# Patient Record
Sex: Male | Born: 1935 | Race: White | Hispanic: No | Smoking: Former smoker
Health system: Southern US, Community
[De-identification: ages and names within clinical notes are randomized; demographics above are authoritative.]

## PROBLEM LIST (undated history)

## (undated) DIAGNOSIS — E119 Type 2 diabetes mellitus without complications: Secondary | ICD-10-CM

## (undated) DIAGNOSIS — J449 Chronic obstructive pulmonary disease, unspecified: Secondary | ICD-10-CM

## (undated) DIAGNOSIS — I480 Paroxysmal atrial fibrillation: Secondary | ICD-10-CM

## (undated) DIAGNOSIS — F419 Anxiety disorder, unspecified: Secondary | ICD-10-CM

## (undated) DIAGNOSIS — E785 Hyperlipidemia, unspecified: Secondary | ICD-10-CM

## (undated) DIAGNOSIS — I499 Cardiac arrhythmia, unspecified: Secondary | ICD-10-CM

## (undated) DIAGNOSIS — G250 Essential tremor: Secondary | ICD-10-CM

## (undated) DIAGNOSIS — N189 Chronic kidney disease, unspecified: Secondary | ICD-10-CM

## (undated) DIAGNOSIS — L409 Psoriasis, unspecified: Secondary | ICD-10-CM

## (undated) DIAGNOSIS — I10 Essential (primary) hypertension: Secondary | ICD-10-CM

## (undated) DIAGNOSIS — I429 Cardiomyopathy, unspecified: Secondary | ICD-10-CM

## (undated) DIAGNOSIS — M48062 Spinal stenosis, lumbar region with neurogenic claudication: Secondary | ICD-10-CM

## (undated) DIAGNOSIS — M1990 Unspecified osteoarthritis, unspecified site: Secondary | ICD-10-CM

## (undated) DIAGNOSIS — N289 Disorder of kidney and ureter, unspecified: Secondary | ICD-10-CM

## (undated) HISTORY — PX: PACEMAKER INSERTION: SHX728

## (undated) HISTORY — PX: TONSILLECTOMY: SUR1361

## (undated) HISTORY — PX: INSERT / REPLACE / REMOVE PACEMAKER: SUR710

## (undated) HISTORY — PX: CYSTOSCOPY: SUR368

## (undated) HISTORY — PX: RETINAL DETACHMENT SURGERY: SHX105

## (undated) HISTORY — PX: ORIF ANKLE FRACTURE: SUR919

## (undated) HISTORY — PX: KIDNEY STONE SURGERY: SHX686

---

## 1993-03-26 HISTORY — PX: PENILE PROSTHESIS IMPLANT: SHX240

## 2002-03-26 HISTORY — PX: LASER OF PROSTATE W/ GREEN LIGHT PVP: SHX1953

## 2004-02-18 ENCOUNTER — Ambulatory Visit: Payer: Self-pay | Admitting: Urology

## 2004-11-09 ENCOUNTER — Other Ambulatory Visit: Payer: Self-pay

## 2004-11-09 ENCOUNTER — Ambulatory Visit: Payer: Self-pay | Admitting: Ophthalmology

## 2004-12-13 ENCOUNTER — Ambulatory Visit: Payer: Self-pay | Admitting: Ophthalmology

## 2005-11-30 ENCOUNTER — Ambulatory Visit: Payer: Self-pay | Admitting: Ophthalmology

## 2005-12-12 ENCOUNTER — Ambulatory Visit: Payer: Self-pay | Admitting: Ophthalmology

## 2007-11-11 ENCOUNTER — Inpatient Hospital Stay: Payer: Self-pay | Admitting: Cardiology

## 2007-11-12 ENCOUNTER — Other Ambulatory Visit: Payer: Self-pay

## 2008-01-13 ENCOUNTER — Ambulatory Visit: Payer: Self-pay | Admitting: Unknown Physician Specialty

## 2008-01-16 ENCOUNTER — Ambulatory Visit: Payer: Self-pay | Admitting: Unknown Physician Specialty

## 2010-05-04 ENCOUNTER — Ambulatory Visit: Payer: Self-pay | Admitting: Vascular Surgery

## 2010-07-11 ENCOUNTER — Ambulatory Visit: Payer: Self-pay | Admitting: Internal Medicine

## 2010-09-15 ENCOUNTER — Inpatient Hospital Stay: Payer: Self-pay | Admitting: Specialist

## 2010-10-11 ENCOUNTER — Ambulatory Visit: Payer: Self-pay | Admitting: Internal Medicine

## 2012-11-06 ENCOUNTER — Inpatient Hospital Stay: Payer: Self-pay | Admitting: Internal Medicine

## 2012-11-06 LAB — CBC WITH DIFFERENTIAL/PLATELET
Basophil %: 0.2 %
Lymphocyte %: 3.5 %
MCH: 31.5 pg (ref 26.0–34.0)
Monocyte #: 1.1 x10 3/mm — ABNORMAL HIGH (ref 0.2–1.0)
Neutrophil #: 14.7 10*3/uL — ABNORMAL HIGH (ref 1.4–6.5)
Neutrophil %: 89.5 %
Platelet: 143 10*3/uL — ABNORMAL LOW (ref 150–440)
RBC: 3.96 10*6/uL — ABNORMAL LOW (ref 4.40–5.90)
RDW: 13.2 % (ref 11.5–14.5)

## 2012-11-06 LAB — COMPREHENSIVE METABOLIC PANEL
Albumin: 2.3 g/dL — ABNORMAL LOW (ref 3.4–5.0)
Alkaline Phosphatase: 88 U/L (ref 50–136)
Anion Gap: 7 (ref 7–16)
Bilirubin,Total: 0.4 mg/dL (ref 0.2–1.0)
Chloride: 96 mmol/L — ABNORMAL LOW (ref 98–107)
Co2: 23 mmol/L (ref 21–32)
Creatinine: 3.28 mg/dL — ABNORMAL HIGH (ref 0.60–1.30)
EGFR (African American): 20 — ABNORMAL LOW
EGFR (Non-African Amer.): 17 — ABNORMAL LOW
Glucose: 204 mg/dL — ABNORMAL HIGH (ref 65–99)
Osmolality: 267 (ref 275–301)
Total Protein: 6.3 g/dL — ABNORMAL LOW (ref 6.4–8.2)

## 2012-11-06 LAB — TROPONIN I: Troponin-I: 0.1 ng/mL — ABNORMAL HIGH

## 2012-11-07 DIAGNOSIS — I4891 Unspecified atrial fibrillation: Secondary | ICD-10-CM

## 2012-11-07 DIAGNOSIS — R7989 Other specified abnormal findings of blood chemistry: Secondary | ICD-10-CM

## 2012-11-07 LAB — BASIC METABOLIC PANEL
Anion Gap: 6 — ABNORMAL LOW (ref 7–16)
BUN: 31 mg/dL — ABNORMAL HIGH (ref 7–18)
Calcium, Total: 8 mg/dL — ABNORMAL LOW (ref 8.5–10.1)
Chloride: 103 mmol/L (ref 98–107)
Co2: 24 mmol/L (ref 21–32)
Creatinine: 2.88 mg/dL — ABNORMAL HIGH (ref 0.60–1.30)
EGFR (African American): 23 — ABNORMAL LOW
Glucose: 150 mg/dL — ABNORMAL HIGH (ref 65–99)
Sodium: 133 mmol/L — ABNORMAL LOW (ref 136–145)

## 2012-11-07 LAB — CBC WITH DIFFERENTIAL/PLATELET
Basophil #: 0 10*3/uL (ref 0.0–0.1)
Basophil %: 0.3 %
Eosinophil #: 0.1 10*3/uL (ref 0.0–0.7)
Eosinophil %: 0.7 %
HGB: 11.8 g/dL — ABNORMAL LOW (ref 13.0–18.0)
Lymphocyte #: 0.8 10*3/uL — ABNORMAL LOW (ref 1.0–3.6)
Lymphocyte %: 6.2 %
MCHC: 35 g/dL (ref 32.0–36.0)
MCV: 90 fL (ref 80–100)
Monocyte #: 1.2 x10 3/mm — ABNORMAL HIGH (ref 0.2–1.0)
Monocyte %: 9 %
Neutrophil #: 10.9 10*3/uL — ABNORMAL HIGH (ref 1.4–6.5)
Platelet: 151 10*3/uL (ref 150–440)
RBC: 3.74 10*6/uL — ABNORMAL LOW (ref 4.40–5.90)
RDW: 13.1 % (ref 11.5–14.5)
WBC: 13 10*3/uL — ABNORMAL HIGH (ref 3.8–10.6)

## 2012-11-07 LAB — TROPONIN I: Troponin-I: 0.11 ng/mL — ABNORMAL HIGH

## 2012-11-07 LAB — CK TOTAL AND CKMB (NOT AT ARMC): CK-MB: 1.5 ng/mL (ref 0.5–3.6)

## 2012-11-07 LAB — PRO B NATRIURETIC PEPTIDE: B-Type Natriuretic Peptide: 7334 pg/mL — ABNORMAL HIGH (ref 0–450)

## 2012-11-08 LAB — CBC WITH DIFFERENTIAL/PLATELET
Comment - H1-Com1: NORMAL
Eosinophil: 3 %
HCT: 35.4 % — ABNORMAL LOW (ref 40.0–52.0)
HGB: 12.3 g/dL — ABNORMAL LOW (ref 13.0–18.0)
MCHC: 34.7 g/dL (ref 32.0–36.0)
Monocytes: 5 %
Myelocyte: 1 %
Platelet: 165 10*3/uL (ref 150–440)
RBC: 3.87 10*6/uL — ABNORMAL LOW (ref 4.40–5.90)
RDW: 13.9 % (ref 11.5–14.5)
WBC: 8.6 10*3/uL (ref 3.8–10.6)

## 2012-11-08 LAB — BASIC METABOLIC PANEL
Anion Gap: 4 — ABNORMAL LOW (ref 7–16)
BUN: 29 mg/dL — ABNORMAL HIGH (ref 7–18)
Calcium, Total: 8.9 mg/dL (ref 8.5–10.1)
Chloride: 109 mmol/L — ABNORMAL HIGH (ref 98–107)
Creatinine: 2.25 mg/dL — ABNORMAL HIGH (ref 0.60–1.30)
EGFR (African American): 31 — ABNORMAL LOW
EGFR (Non-African Amer.): 27 — ABNORMAL LOW
Glucose: 124 mg/dL — ABNORMAL HIGH (ref 65–99)
Osmolality: 285 (ref 275–301)
Potassium: 4.3 mmol/L (ref 3.5–5.1)

## 2012-11-09 LAB — BASIC METABOLIC PANEL
BUN: 23 mg/dL — ABNORMAL HIGH (ref 7–18)
Creatinine: 1.99 mg/dL — ABNORMAL HIGH (ref 0.60–1.30)
Glucose: 191 mg/dL — ABNORMAL HIGH (ref 65–99)
Osmolality: 283 (ref 275–301)
Sodium: 137 mmol/L (ref 136–145)

## 2012-11-10 LAB — BASIC METABOLIC PANEL
Calcium, Total: 9.7 mg/dL (ref 8.5–10.1)
Chloride: 106 mmol/L (ref 98–107)
Co2: 30 mmol/L (ref 21–32)
Creatinine: 1.92 mg/dL — ABNORMAL HIGH (ref 0.60–1.30)
EGFR (African American): 38 — ABNORMAL LOW
EGFR (Non-African Amer.): 33 — ABNORMAL LOW
Glucose: 149 mg/dL — ABNORMAL HIGH (ref 65–99)
Osmolality: 283 (ref 275–301)
Potassium: 4.5 mmol/L (ref 3.5–5.1)
Sodium: 139 mmol/L (ref 136–145)

## 2012-11-11 DIAGNOSIS — I4891 Unspecified atrial fibrillation: Secondary | ICD-10-CM

## 2012-11-11 LAB — BASIC METABOLIC PANEL
Calcium, Total: 9.4 mg/dL (ref 8.5–10.1)
Co2: 30 mmol/L (ref 21–32)
Creatinine: 1.89 mg/dL — ABNORMAL HIGH (ref 0.60–1.30)
EGFR (Non-African Amer.): 33 — ABNORMAL LOW
Glucose: 139 mg/dL — ABNORMAL HIGH (ref 65–99)
Potassium: 4.1 mmol/L (ref 3.5–5.1)
Sodium: 138 mmol/L (ref 136–145)

## 2012-11-11 LAB — CBC WITH DIFFERENTIAL/PLATELET
Basophil #: 0 10*3/uL (ref 0.0–0.1)
Basophil %: 0.5 %
Eosinophil #: 0.3 10*3/uL (ref 0.0–0.7)
HCT: 38.2 % — ABNORMAL LOW (ref 40.0–52.0)
HGB: 13.1 g/dL (ref 13.0–18.0)
Lymphocyte #: 1.2 10*3/uL (ref 1.0–3.6)
Lymphocyte %: 12.8 %
MCH: 31.4 pg (ref 26.0–34.0)
MCHC: 34.3 g/dL (ref 32.0–36.0)
Monocyte %: 9.1 %
Neutrophil #: 6.8 10*3/uL — ABNORMAL HIGH (ref 1.4–6.5)
Platelet: 212 10*3/uL (ref 150–440)
RBC: 4.17 10*6/uL — ABNORMAL LOW (ref 4.40–5.90)

## 2012-11-11 LAB — CULTURE, BLOOD (SINGLE)

## 2013-02-25 ENCOUNTER — Ambulatory Visit: Payer: Self-pay | Admitting: Specialist

## 2013-09-09 ENCOUNTER — Ambulatory Visit: Payer: Self-pay | Admitting: Nephrology

## 2014-05-05 DIAGNOSIS — Z950 Presence of cardiac pacemaker: Secondary | ICD-10-CM | POA: Diagnosis not present

## 2014-05-05 DIAGNOSIS — R0602 Shortness of breath: Secondary | ICD-10-CM | POA: Diagnosis not present

## 2014-05-05 DIAGNOSIS — I10 Essential (primary) hypertension: Secondary | ICD-10-CM | POA: Diagnosis not present

## 2014-05-05 DIAGNOSIS — I480 Paroxysmal atrial fibrillation: Secondary | ICD-10-CM | POA: Diagnosis not present

## 2014-05-05 DIAGNOSIS — E11329 Type 2 diabetes mellitus with mild nonproliferative diabetic retinopathy without macular edema: Secondary | ICD-10-CM | POA: Diagnosis not present

## 2014-05-05 DIAGNOSIS — H35371 Puckering of macula, right eye: Secondary | ICD-10-CM | POA: Diagnosis not present

## 2014-05-05 DIAGNOSIS — H43811 Vitreous degeneration, right eye: Secondary | ICD-10-CM | POA: Diagnosis not present

## 2014-07-16 NOTE — Consult Note (Signed)
Present Illness 81 year old who presented with 3 days of dysuria, fevers, chills, fatigue and some back pain,  mild nausea,  found to have severe UTI. Cardiology was consulted for elevated cardiac enz.  initially the decided by patient???s request to try outpatient treatment with Rocephin 1 gram IM in the office and  Cipro p.o. He did this through the first day though reported continued malaise and was admitted to the hospital. he had an elevated white count of 23,000, and renal function was worse, 17% GFR.   He reported increasing edema of the lower extremity, increasing bloating in the abdominal region,  some chest tightness.  He did have some fevers and chills last night the night before admission.   Denies any significant cardiac history. No recent chest pains with exertion prior to recent illness.    PAST MEDICAL HISTORY: 1.  Inflammatory arthritis, sero-negative.  2.  Gastritis and peptic ulcer disease.  3.  Hypercholesterolemia.  4.  Hypertension.  5.  Familial tremor.  6.  Osteopenia.  7.  Diabetes, type 2.  8.  Chronic kidney disease with a baseline creatinine of 1.3.  9.  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  10.  COPD. 11.  Osteoarthritis   12.  Anxiety. 13.  Nephrolithiasis.   PAST SURGICAL HISTORY: 1.  Nephrotomy for kidney stones.  2.  Penile implant.  3.  Dual chamber pacemaker in 2009.  4.  Tonsillectomy.  5.  Right ankle fracture with ORIF repair June 2012.   SOCIAL HISTORY:  Married.  Denies smoking but does chew tobacco. Denies alcohol.   FAMILY HISTORY: His brother died of heart disease in his 67s. Mother also had a heart problem.   Physical Exam:  GEN well developed, well nourished, no acute distress   HEENT hearing intact to voice, moist oral mucosa   NECK supple   RESP normal resp effort  clear BS  wheezing   CARD Regular rate and rhythm  No murmur   ABD denies tenderness  soft   LYMPH negative neck   EXTR negative  edema   SKIN normal to palpation   NEURO motor/sensory function intact   PSYCH alert, A+O to time, place, person, good insight   Review of Systems:  Subjective/Chief Complaint Feels ok, increased tremor, weak   General: Weakness   Skin: No Complaints   ENT: No Complaints   Eyes: No Complaints   Neck: No Complaints   Respiratory: No Complaints   Cardiovascular: No Complaints   Gastrointestinal: No Complaints   Genitourinary: No Complaints   Vascular: No Complaints   Musculoskeletal: No Complaints   Neurologic: No Complaints   Hematologic: No Complaints   Endocrine: No Complaints   Psychiatric: No Complaints   Review of Systems: All other systems were reviewed and found to be negative   Medications/Allergies Reviewed Medications/Allergies reviewed     pacemaker:    right ankle fracture: 15-Sep-2010       Admit Diagnosis:   RENAL FAILURE PYELONEPHRITIS.: Onset Date: 07-Nov-2012, Status: Active, Description: RENAL FAILURE PYELONEPHRITIS.  Home Medications: Medication Instructions Status  citalopram tablet 20 mg 1 tab(s) orally once a day  Active  glipiZIDE tablet, extended release 2.5 mg 1 tab(s) orally once a day  Active  omeprazole 20 mg oral delayed release tablet 1 tab(s) orally 2 times a day Active  simvastatin 40 mg oral tablet 1 tab(s) orally once a day (at bedtime) Active  primidone 50 mg oral tablet 1 tab(s) orally 2 times a  day Active  lorazepam 0.5 mg oral tablet tab(s) orally once a day, As Needed Active  metoprolol 50 tab(s) orally once a day Active  Aspir 81 oral delayed release tablet 1 tab(s) orally once a day Active  Advair Diskus 250 mcg-50 mcg inhalation powder 1 puff(s) inhaled 2 times a day Active  quinapril 10 mg oral tablet 1 tab(s) orally once a day Active  gabapentin 300 mg oral capsule 1 cap(s) orally 3 times a day Active   Lab Results:  Routine Chem:  15-Aug-14 00:35   Glucose, Serum  150  BUN  31  Creatinine (comp)   2.88  Sodium, Serum  133  Potassium, Serum 3.7  Chloride, Serum 103  CO2, Serum 24  Calcium (Total), Serum  8.0  Anion Gap  6  Osmolality (calc) 276  eGFR (African American)  23  eGFR (Non-African American)  20 (eGFR values <49m/min/1.73 m2 may be an indication of chronic kidney disease (CKD). Calculated eGFR is useful in patients with stable renal function. The eGFR calculation will not be reliable in acutely ill patients when serum creatinine is changing rapidly. It is not useful in  patients on dialysis. The eGFR calculation may not be applicable to patients at the low and high extremes of body sizes, pregnant women, and vegetarians.)  Result Comment TROPONIN - RESULTS VERIFIED BY REPEAT TESTING.  - PREVIOUS CALL:11/06/12 AT 1725..Marland KitchenMarland KitchenPL  Result(s) reported on 07 Nov 2012 at 01:49AM.  B-Type Natriuretic Peptide ([facility]  [phone](Result(s) reported on 07 Nov 2012 at 01:27AM.)  Cardiac:  15-Aug-14 00:35   CK, Total 56  CPK-MB, Serum 1.3 (Result(s) reported on 07 Nov 2012 at 01:42AM.)  Troponin I  0.11 (0.00-0.05 0.05 ng/mL or less: NEGATIVE  Repeat testing in 3-6 hrs  if clinically indicated. >0.05 ng/mL: POTENTIAL  MYOCARDIAL INJURY. Repeat  testing in 3-6 hrs if  clinically indicated. NOTE: An increase or decrease  of 30% or more on serial  testing suggests a  clinically important change)  Routine Hem:  15-Aug-14 00:35   WBC (CBC)  13.0  RBC (CBC)  3.74  Hemoglobin (CBC)  11.8  Hematocrit (CBC)  33.7  Platelet Count (CBC) 151  MCV 90  MCH 31.6  MCHC 35.0  RDW 13.1  Neutrophil % 83.8  Lymphocyte % 6.2  Monocyte % 9.0  Eosinophil % 0.7  Basophil % 0.3  Neutrophil #  10.9  Lymphocyte #  0.8  Monocyte #  1.2  Eosinophil # 0.1  Basophil # 0.0 (Result(s) reported on 07 Nov 2012 at 01:11AM.)   EKG:  Interpretation EKG shows atrial fibrillation with rate 103 bpm.Diffuse ST and T wave ABN   Radiology Results: [place]    14-Aug-14 18:13, [place]  [place]  Bilateral   REASON FOR EXAM:    ARF  COMMENTS:       PROCEDURE: [place] - [facility]  - Nov 06 2012  6:13PM     RESULT: The right kidney measures 13.3 x 6.8 x 6.6 cm. The left kidney   measures 10.7 x 6.1 x 4.7 cm. The echotexture of the renal cortex on the   right remains lower than that of the adjacent liver. There is no   hydronephrosis on the right or on the left. The cortex appears somewhat   thinned on the left and overall the kidney is smaller than the right. The   echotexture of the cortex on the left appears mildly increased.    The urinary bladder  is partially distended. Ureteral jets cord   demonstrated. There is likely a bladder latter diverticulum measuring   approximately 3 cm in diameter to the right of midline.  IMPRESSION:   1. There is mild atrophy of the left kidney with cortical thinning   increased cortical echotexture.  2. There is no evidence of obstruction of either kidney.  3. A bladder diverticulum on the right is suspected.     Dictation Site: 5        Verified By: DAVID A. Martinique, M.D., MD    Sulfa drugs: Rash  Lipitor: Other  Vital Signs/Nurse's Notes: **Vital Signs.:   15-Aug-14 11:49  Vital Signs Type Q 4hr  Temperature Temperature (F) 97.3  Celsius 36.2  Temperature Source axillary  Pulse Pulse 101  Respirations Respirations 19  Systolic BP Systolic BP 97  Diastolic BP (mmHg) Diastolic BP (mmHg) 60  Mean BP 72  Pulse Ox % Pulse Ox % 88  Pulse Ox Activity Level  At rest  Oxygen Delivery 1L    Impression 81 year old who presented with 3 days of dysuria, fevers, chills, fatigue and some back pain,  mild nausea,  found to have severe UTI. Cardiology was consulted for elevated cardiac enz.  1) Atrial fibrillation: In atrial fib on arrival, converted to NSR at 7 pm today Suspect he may have paroxysmal atrial fibrillation He is asymptomatic, with worsening SOB, edema for one week Edema does come and go per the wife. Prelim ECHO showing  grossly normal EF. --Will start eliquis 2.5 mg BID --Will need to decrease metoprolol dose in setting of hypotension, infection from UTI --If he has additional epsiodes of atrial fib, will start amiodarone BID dosing  2) Elevated cardiac enz: Minimal change, minimal elevation Demand ischemia from hypotension from infection, atrial fib wityh RVR Medical management at this time  3) UTI/sepsis On abx,  BP still running low Would hopld ACE, hold parameters on metoprolol placed  4) Pacer: sick sinus syndrome  5) Rneall insufficiency: baseline uncertain Mild improvement Possible ATN from infection   Electronic Signatures: Ida Rogue (MD)  (Signed 16-Aug-14 10:25)  Authored: General Aspect/Present Illness, History and Physical Exam, Review of System, Past Medical History, Health Issues, Home Medications, Labs, EKG , Radiology, Allergies, Vital Signs/Nurse's Notes, Impression/Plan   Last Updated: 16-Aug-14 10:25 by Ida Rogue (MD)

## 2014-07-16 NOTE — Discharge Summary (Signed)
PATIENT NAME:  Johnathan Hudson MR#:  [id number] DATE OF BIRTH:  1933/10/10  DATE OF ADMISSION:  11/06/2012 DATE OF DISCHARGE:  11/11/2012  DIAGNOSES AT THE TIME OF DISCHARGE:  1. Urinary tract infection with systemic inflammatory response syndrome, resolved with treatment.  2. Acute renal failure.  3. Type 2 diabetes.  4. Hypertension.  5. Tremors.  6. Elevated cardiac enzymes.  7. Atrial fibrillation.   CHIEF COMPLAINT: Abdominal bloating, chest tightness, fevers and chills.   HISTORY OF PRESENT ILLNESS: Johnathan Hudson is a 81 year old male who presented to the clinic complaining of dysuria associated with fever, chills, fatigue and back pain, associated with nausea. The patient was noted to have a urinary tract infection and subsequently admitted to the hospital. The patient was tried, the day prior to admission, with outpatient therapy including Rocephin and Cipro, but since his condition was still declining and his white cell count was elevated at 23,000 with significant worsening renal function, he was admitted for intravenous antibiotics and IV fluids.   PAST MEDICAL HISTORY: Significant for: 1. Inflammatory arthritis.  2. Gastritis.  3. Hyperlipidemia.  4. Hypertension.  5. Familial tremor.  6. Osteopenia.  7. Type 2 diabetes.  8. Chronic kidney disease, baseline creatinine 1.3.  9. 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  10. History of COPD. 11. Osteoarthritis.  12. Anxiety.  13. Nephrolithiasis.   PAST SURGICAL HISTORY: Significant for:  1. Nephrotomy for kidney stones.  2. Penile implant.  3. Dual chamber pacemaker.  4. Tonsillectomy.  5. Right ankle fracture with ORIF.   PHYSICAL EXAMINATION:  GENERAL: He was not in distress.  VITAL SIGNS: Weight was 187, blood pressure 110/70, temperature was 97, pulse was 87, O2 saturation 95% on room air.  HEENT: [place], AT.  NECK: Supple.  HEART: S1, S2.  ABDOMEN: Distended.  EXTREMITIES: 1+ edema.   NEUROLOGIC: Nonfocal.   HOSPITAL COURSE: The patient was noted to have an elevated white cell count of 23,000. Creatinine 3.6, BUN of 31, potassium 4.1, sodium 130, glucose 184. He had been given 1 gram of Rocephin in the office the day prior to admission and subsequently placed on Zosyn. He was also started on normal saline, and his glipizide was held, and he was switched to sliding scale coverage. During his stay in the hospital, the patient was also seen in consultation by cardiologist, Dr. Rockey Situ, because he developed atrial fibrillation and was started on amiodarone and low-dose Eliquis. His metoprolol was increased to 75 mg for rate and rhythm control. EKG subsequently showed normal sinus rhythm with ST-T changes consistent with ischemia. His troponin also was noted to be elevated at 0.11, total protein was 6.3, albumin was 2.3. The patient's renal function gradually improved. His creatinine came down to 1.89, and he was switched from Zosyn to Cipro. An echocardiogram showed LVEF of 60% to 65%, normal global left ventricular systolic function, with mild concentric LVH, mildly increased LV septal thickness, mildly increased left ventricular posterior wall thickness. The patient appeared to respond well to above measures, and his clinical condition improved significantly. He was back in sinus rhythm and was ambulated, seen by physical therapy and was stable at the time of discharge. An ultrasound of the kidney showed mild atrophy of the left kidney with cortical thickening, with increased cortical echo structure. There was no evidence of obstruction in either kidney. A bladder diverticulum on the right was suspected. The patient was discharged in stable condition on the following medications.   DISCHARGE MEDICATIONS:  1. Glipizide extended release 2.5 mg once a day.  2. Simvastatin 40 mg once a day.  3. Primidone 50 mg b.i.d. 4. Lorazepam 0.5 mg once a day as needed.  5. Aspirin 81 mg a day.  6.  Gabapentin 300 mg 3 times a day.  7. Quinapril 5 mg once a day.  8. Metoprolol succinate 75 mg once a day. 9. Apixaban 2.5 mg b.i.d. 10. Amiodarone 200 mg once a day.  11. Cipro 500 mg p.o. b.i.d. for 5 more days.   FOLLOWUP: The patient was advised to follow up with his cardiologist, Dr. Saralyn Pilar, and also follow with me, Dr. Ginette Pitman, in 1 to 2 weeks' time.   TOTAL TIME SPENT ON DISCHARGING THIS PATIENT: 35 minutes.   ____________________________ Tracie Harrier, MD vh:OSi D: 11/12/2012 13:07:18 ET T: 11/12/2012 14:17:17 ET JOB#: [id number]  cc: Tracie Harrier, MD, <Dictator> Tracie Harrier MD ELECTRONICALLY SIGNED 11/27/2012 17:39

## 2014-07-16 NOTE — H&P (Signed)
PATIENT NAME:  Johnathan Hudson, Johnathan Hudson MR#:  [id number] DATE OF BIRTH:  [birth year]  DATE OF ADMISSION:  11/06/2012  CHIEF COMPLAINT: Acute renal failure.   HISTORY OF PRESENT ILLNESS: A 81 year old who presented to the clinic yesterday with 3 days of dysuria, fevers, chills, fatigue and some back pain. He did have some mild nausea yesterday. He was found to have severe UTI and hospital admission was discussed. It was decided by patient's request to try outpatient treatment with Rocephin 1 gram IM in the office and starting on Cipro p.o. He did this through the day yesterday and in further contact today had shown signs of declining. His lab results came back with an elevated white count of 23,000, and renal function had declined significantly to approximately 17% GFR. He has noticed increasing edema, lower extremity. He has also noticed some increasing bloating in the abdominal region as well as some chest tightness. His nausea has improved. He did have some fevers and chills last night. He is afebrile here in the office. He does have a history of UTIs and has been followed by Dr. Yves Dill in the past.    PAST MEDICAL HISTORY: 1.  Inflammatory arthritis, sero-negative.  2.  Gastritis and peptic ulcer disease.  3.  Hypercholesterolemia.  4.  Hypertension.  5.  Familial tremor.  6.  Osteopenia.  7.  Diabetes, type 2.  8.  Chronic kidney disease with a baseline creatinine of 1.3.  9.  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  10.  COPD. 11.  Osteoarthritis   12.  Anxiety. 13.  Nephrolithiasis.   PAST SURGICAL HISTORY: 1.  Nephrotomy for kidney stones.  2.  Penile implant.  3.  Dual chamber pacemaker in 2009.  4.  Tonsillectomy.  5.  Right ankle fracture with ORIF repair June 2012.   SOCIAL HISTORY:  Married.  Denies smoking but does chew tobacco. Denies alcohol.  FAMILY HISTORY: His brother died of heart disease in his 87s. Mother also had a heart problem.   REVIEW OF SYSTEMS: He  reports some headache as well as some neck pain. No sore throat or ear pain. Minimal congestion. He has had a mild cough. He did report some chest tightness. He has had abdominal bloating. He has been drinking lots of water. Bowels have not moved today. He has had dysuria. No prior history of obstruction, per patient, though he has had kidney stones. He is having new lower extremity edema. He has chronic back pain. He has been on primidone for a tremor, which he does not feel has helped, more recently started with gabapentin approximately a week or 2 ago which he does see some benefit.  No current nausea or vomiting. He was able to eat some food today.   ALLERGIES: LIPITOR AND SULFA.   CURRENT MEDICATIONS: 1.  Ibuprofen 200 mg as needed.  2.  Primidone 50 mg 5 tablets at bedtime.  3.  Glipizide 2.5 mg daily.  4.  Lorazepam 0.5 mg b.i.d. p.r.n.  5.  Citalopram 20 mg daily.  6.  Quinapril 10 mg daily.  7.  Prilosec 20 mg daily.  8.  Aspirin 81 mg daily.  9.  Simvastatin 40 mg at bedtime.  10.  Metoprolol 50 mg daily.  11.  Advair 250/50, 1 puff b.i.d.  12.  Fish oil 1000 mg daily.  13.  Gabapentin 300 mg b.i.d.  PHYSICAL EXAMINATION: GENERAL: Elderly male in no acute distress.  VITAL SIGNS: His weight is 187, up 5  pounds from yesterday.  His pressure is 110/70, temperature 97.4, pulse is 87.  His O2 sat is 95% on room air.  HEENT: Head is normocephalic, atraumatic. Pupils are equal, round, and reactive to light. EOMs are intact. Noninjected conjunctiva.  Oropharynx is clear with upper and lower dentures in place. His ear exam shows clear canals with normal landmarks of the TMs.  NECK: Supple with no adenopathy, thyromegaly or carotid bruits noted.   HEART: Regular rate and rhythm with occasional premature beats. No murmurs noted.  LUNGS: Show diminished breath sounds with few basilar rales.  ABDOMEN: Distended, tight with minimal tenderness.  EXTREMITIES: Showing 1+ pitting edema in the  ankles to the feet. His DP pulses are 2+ and symmetric, radial pulses also 2+.   SKIN: Warm and dry.  NEUROLOGIC: Nonfocal.   ASSESSMENT AND PLAN: 1.  Urinary tract infection, pyelonephritis. White blood count 23,000 in the office on 08/13. Creatinine at 3.6, with a BUN of 31, a potassium of 4.3, a sodium of 130, glucose of 184. He was given Rocephin 1 gram IM in the office yesterday. Today, we will start Zosyn 2.25 IV every 6 hours with Pharmacy consult on dosing for renal insufficiency, acute renal failure. Tylenol 650 t.i.d. p.r.n. for fever. Culture pending from [facility] sample. History of UTIs followed by Dr. Yves Dill, Urology, consider consult. Will obtain renal ultrasound.  2.  Acute renal failure. Baseline creatinine 1.3. Yesterday, creatinine 3.6 in the office. Start normal saline at 150 mL/h. Recheck creatinine today and again in the morning. Consider Nephrology consult.  3.  Diabetes, type 2.  Hold glipizide. Switch to sliding scale coverage for before meals and at bedtime glucose monitoring.  4.  Hypertension. Hold metoprolol and quinapril. Monitor pressure.  5.  Chronic obstructive pulmonary disease. Continue Advair. Continue lorazepam for anxiety.  6.  Tremors. Holding primidone. Continue gabapentin and lorazepam. I will obtain a chest x-ray.  7.  Chest tightness. Obtain EKG and troponin.    ____________________________ Marily Lente. Elvira Langston, PA-C rjt:cb D: 11/06/2012 17:08:22 ET T: 11/06/2012 17:27:59 ET JOB#: [id number]  cc: Marily Lente. Lolita Lenz, <Dictator> Leonard Downing PA ELECTRONICALLY SIGNED 11/16/2012 14:44

## 2014-07-20 DIAGNOSIS — I495 Sick sinus syndrome: Secondary | ICD-10-CM | POA: Diagnosis not present

## 2014-07-23 DIAGNOSIS — I129 Hypertensive chronic kidney disease with stage 1 through stage 4 chronic kidney disease, or unspecified chronic kidney disease: Secondary | ICD-10-CM | POA: Diagnosis not present

## 2014-07-23 DIAGNOSIS — N2581 Secondary hyperparathyroidism of renal origin: Secondary | ICD-10-CM | POA: Diagnosis not present

## 2014-07-23 DIAGNOSIS — E1122 Type 2 diabetes mellitus with diabetic chronic kidney disease: Secondary | ICD-10-CM | POA: Diagnosis not present

## 2014-07-23 DIAGNOSIS — N183 Chronic kidney disease, stage 3 (moderate): Secondary | ICD-10-CM | POA: Diagnosis not present

## 2014-07-26 DIAGNOSIS — I480 Paroxysmal atrial fibrillation: Secondary | ICD-10-CM | POA: Diagnosis not present

## 2014-07-26 DIAGNOSIS — E1122 Type 2 diabetes mellitus with diabetic chronic kidney disease: Secondary | ICD-10-CM | POA: Diagnosis not present

## 2014-07-26 DIAGNOSIS — E782 Mixed hyperlipidemia: Secondary | ICD-10-CM | POA: Diagnosis not present

## 2014-07-26 DIAGNOSIS — N183 Chronic kidney disease, stage 3 (moderate): Secondary | ICD-10-CM | POA: Diagnosis not present

## 2014-07-26 DIAGNOSIS — I10 Essential (primary) hypertension: Secondary | ICD-10-CM | POA: Diagnosis not present

## 2014-07-26 DIAGNOSIS — Z950 Presence of cardiac pacemaker: Secondary | ICD-10-CM | POA: Diagnosis not present

## 2014-07-26 DIAGNOSIS — Z0000 Encounter for general adult medical examination without abnormal findings: Secondary | ICD-10-CM | POA: Diagnosis not present

## 2014-08-02 DIAGNOSIS — I480 Paroxysmal atrial fibrillation: Secondary | ICD-10-CM | POA: Diagnosis not present

## 2014-08-02 DIAGNOSIS — Z950 Presence of cardiac pacemaker: Secondary | ICD-10-CM | POA: Diagnosis not present

## 2014-08-02 DIAGNOSIS — R251 Tremor, unspecified: Secondary | ICD-10-CM | POA: Diagnosis not present

## 2014-08-02 DIAGNOSIS — I10 Essential (primary) hypertension: Secondary | ICD-10-CM | POA: Diagnosis not present

## 2014-08-17 DIAGNOSIS — N3001 Acute cystitis with hematuria: Secondary | ICD-10-CM | POA: Diagnosis not present

## 2014-08-17 DIAGNOSIS — N23 Unspecified renal colic: Secondary | ICD-10-CM | POA: Diagnosis not present

## 2014-08-24 DIAGNOSIS — N401 Enlarged prostate with lower urinary tract symptoms: Secondary | ICD-10-CM | POA: Diagnosis not present

## 2014-08-26 DIAGNOSIS — N401 Enlarged prostate with lower urinary tract symptoms: Secondary | ICD-10-CM | POA: Diagnosis not present

## 2014-08-26 DIAGNOSIS — N3001 Acute cystitis with hematuria: Secondary | ICD-10-CM | POA: Diagnosis not present

## 2014-10-12 DIAGNOSIS — I495 Sick sinus syndrome: Secondary | ICD-10-CM | POA: Diagnosis not present

## 2014-11-08 DIAGNOSIS — I480 Paroxysmal atrial fibrillation: Secondary | ICD-10-CM | POA: Diagnosis not present

## 2014-11-08 DIAGNOSIS — E119 Type 2 diabetes mellitus without complications: Secondary | ICD-10-CM | POA: Diagnosis not present

## 2014-11-08 DIAGNOSIS — Z950 Presence of cardiac pacemaker: Secondary | ICD-10-CM | POA: Diagnosis not present

## 2014-11-08 DIAGNOSIS — I10 Essential (primary) hypertension: Secondary | ICD-10-CM | POA: Diagnosis not present

## 2014-11-25 DIAGNOSIS — R251 Tremor, unspecified: Secondary | ICD-10-CM | POA: Diagnosis not present

## 2014-11-25 DIAGNOSIS — I480 Paroxysmal atrial fibrillation: Secondary | ICD-10-CM | POA: Diagnosis not present

## 2014-11-25 DIAGNOSIS — N183 Chronic kidney disease, stage 3 (moderate): Secondary | ICD-10-CM | POA: Diagnosis not present

## 2014-11-25 DIAGNOSIS — E119 Type 2 diabetes mellitus without complications: Secondary | ICD-10-CM | POA: Diagnosis not present

## 2014-11-25 DIAGNOSIS — I10 Essential (primary) hypertension: Secondary | ICD-10-CM | POA: Diagnosis not present

## 2014-11-25 DIAGNOSIS — Z950 Presence of cardiac pacemaker: Secondary | ICD-10-CM | POA: Diagnosis not present

## 2014-11-26 DIAGNOSIS — L720 Epidermal cyst: Secondary | ICD-10-CM | POA: Diagnosis not present

## 2014-11-26 DIAGNOSIS — L821 Other seborrheic keratosis: Secondary | ICD-10-CM | POA: Diagnosis not present

## 2014-11-26 DIAGNOSIS — Z85828 Personal history of other malignant neoplasm of skin: Secondary | ICD-10-CM | POA: Diagnosis not present

## 2014-11-26 DIAGNOSIS — L570 Actinic keratosis: Secondary | ICD-10-CM | POA: Diagnosis not present

## 2014-11-26 DIAGNOSIS — X32XXXA Exposure to sunlight, initial encounter: Secondary | ICD-10-CM | POA: Diagnosis not present

## 2014-12-02 DIAGNOSIS — M5441 Lumbago with sciatica, right side: Secondary | ICD-10-CM | POA: Diagnosis not present

## 2014-12-02 DIAGNOSIS — I480 Paroxysmal atrial fibrillation: Secondary | ICD-10-CM | POA: Diagnosis not present

## 2014-12-02 DIAGNOSIS — E119 Type 2 diabetes mellitus without complications: Secondary | ICD-10-CM | POA: Diagnosis not present

## 2014-12-02 DIAGNOSIS — R251 Tremor, unspecified: Secondary | ICD-10-CM | POA: Diagnosis not present

## 2014-12-10 DIAGNOSIS — R351 Nocturia: Secondary | ICD-10-CM | POA: Diagnosis not present

## 2014-12-10 DIAGNOSIS — N5201 Erectile dysfunction due to arterial insufficiency: Secondary | ICD-10-CM | POA: Diagnosis not present

## 2014-12-10 DIAGNOSIS — N401 Enlarged prostate with lower urinary tract symptoms: Secondary | ICD-10-CM | POA: Diagnosis not present

## 2014-12-10 DIAGNOSIS — R3914 Feeling of incomplete bladder emptying: Secondary | ICD-10-CM | POA: Diagnosis not present

## 2014-12-17 DIAGNOSIS — H35371 Puckering of macula, right eye: Secondary | ICD-10-CM | POA: Diagnosis not present

## 2014-12-17 DIAGNOSIS — E11329 Type 2 diabetes mellitus with mild nonproliferative diabetic retinopathy without macular edema: Secondary | ICD-10-CM | POA: Diagnosis not present

## 2014-12-17 DIAGNOSIS — H43811 Vitreous degeneration, right eye: Secondary | ICD-10-CM | POA: Diagnosis not present

## 2014-12-23 DIAGNOSIS — I495 Sick sinus syndrome: Secondary | ICD-10-CM | POA: Diagnosis not present

## 2015-01-18 DIAGNOSIS — E1122 Type 2 diabetes mellitus with diabetic chronic kidney disease: Secondary | ICD-10-CM | POA: Diagnosis not present

## 2015-01-18 DIAGNOSIS — R809 Proteinuria, unspecified: Secondary | ICD-10-CM | POA: Diagnosis not present

## 2015-01-18 DIAGNOSIS — I10 Essential (primary) hypertension: Secondary | ICD-10-CM | POA: Diagnosis not present

## 2015-01-18 DIAGNOSIS — R319 Hematuria, unspecified: Secondary | ICD-10-CM | POA: Diagnosis not present

## 2015-01-18 DIAGNOSIS — N183 Chronic kidney disease, stage 3 (moderate): Secondary | ICD-10-CM | POA: Diagnosis not present

## 2015-02-09 DIAGNOSIS — H35343 Macular cyst, hole, or pseudohole, bilateral: Secondary | ICD-10-CM | POA: Diagnosis not present

## 2015-02-22 DIAGNOSIS — R0602 Shortness of breath: Secondary | ICD-10-CM | POA: Diagnosis not present

## 2015-02-22 DIAGNOSIS — I480 Paroxysmal atrial fibrillation: Secondary | ICD-10-CM | POA: Diagnosis not present

## 2015-02-22 DIAGNOSIS — I495 Sick sinus syndrome: Secondary | ICD-10-CM | POA: Diagnosis not present

## 2015-02-22 DIAGNOSIS — R079 Chest pain, unspecified: Secondary | ICD-10-CM | POA: Diagnosis not present

## 2015-02-22 DIAGNOSIS — Z789 Other specified health status: Secondary | ICD-10-CM | POA: Diagnosis not present

## 2015-02-22 DIAGNOSIS — I10 Essential (primary) hypertension: Secondary | ICD-10-CM | POA: Diagnosis not present

## 2015-02-22 DIAGNOSIS — E782 Mixed hyperlipidemia: Secondary | ICD-10-CM | POA: Diagnosis not present

## 2015-02-22 DIAGNOSIS — Z950 Presence of cardiac pacemaker: Secondary | ICD-10-CM | POA: Diagnosis not present

## 2015-02-22 DIAGNOSIS — E119 Type 2 diabetes mellitus without complications: Secondary | ICD-10-CM | POA: Diagnosis not present

## 2015-02-23 ENCOUNTER — Encounter
Admission: RE | Admit: 2015-02-23 | Discharge: 2015-02-23 | Disposition: A | Discharge status: Home / Self Care | Payer: Commercial Managed Care - HMO | Source: Ambulatory Visit | Attending: Cardiology | Admitting: Cardiology

## 2015-02-23 ENCOUNTER — Ambulatory Visit
Admission: RE | Admit: 2015-02-23 | Discharge: 2015-02-23 | Disposition: A | Discharge status: Home / Self Care | Payer: Commercial Managed Care - HMO | Source: Ambulatory Visit | Attending: Cardiology | Admitting: Cardiology

## 2015-02-23 ENCOUNTER — Other Ambulatory Visit: Payer: Self-pay

## 2015-02-23 DIAGNOSIS — Z01818 Encounter for other preprocedural examination: Secondary | ICD-10-CM | POA: Insufficient documentation

## 2015-02-23 DIAGNOSIS — I10 Essential (primary) hypertension: Secondary | ICD-10-CM | POA: Diagnosis not present

## 2015-02-23 DIAGNOSIS — Z419 Encounter for procedure for purposes other than remedying health state, unspecified: Secondary | ICD-10-CM

## 2015-02-23 DIAGNOSIS — Z01810 Encounter for preprocedural cardiovascular examination: Secondary | ICD-10-CM | POA: Diagnosis not present

## 2015-02-23 DIAGNOSIS — I517 Cardiomegaly: Secondary | ICD-10-CM | POA: Diagnosis not present

## 2015-02-23 HISTORY — DX: Chronic kidney disease, unspecified: N18.9

## 2015-02-23 HISTORY — DX: Hyperlipidemia, unspecified: E78.5

## 2015-02-23 HISTORY — DX: Chronic obstructive pulmonary disease, unspecified: J44.9

## 2015-02-23 HISTORY — DX: Cardiomyopathy, unspecified: I42.9

## 2015-02-23 HISTORY — DX: Cardiac arrhythmia, unspecified: I49.9

## 2015-02-23 HISTORY — DX: Type 2 diabetes mellitus without complications: E11.9

## 2015-02-23 HISTORY — DX: Essential (primary) hypertension: I10

## 2015-02-23 LAB — APTT: APTT: 36 s (ref 24–36)

## 2015-02-23 LAB — CBC
HEMATOCRIT: 40.6 % (ref 40.0–52.0)
HEMOGLOBIN: 13.3 g/dL (ref 13.0–18.0)
MCH: 29.9 pg (ref 26.0–34.0)
MCHC: 32.8 g/dL (ref 32.0–36.0)
MCV: 91 fL (ref 80.0–100.0)
Platelets: 173 10*3/uL (ref 150–440)
RBC: 4.46 MIL/uL (ref 4.40–5.90)
RDW: 13.5 % (ref 11.5–14.5)
WBC: 5.4 10*3/uL (ref 3.8–10.6)

## 2015-02-23 LAB — SURGICAL PCR SCREEN
MRSA, PCR: POSITIVE — AB
STAPHYLOCOCCUS AUREUS: POSITIVE — AB

## 2015-02-23 LAB — PROTIME-INR
INR: 1.2
Prothrombin Time: 15.4 seconds — ABNORMAL HIGH (ref 11.4–15.0)

## 2015-02-23 NOTE — Patient Instructions (Signed)
  Your procedure is scheduled on: 03/01/15 Report to Day Surgery. To find out your arrival time please call [phone] between 1PM - 3PM on 02/28/15.  Remember: Instructions that are not followed completely may result in serious medical risk, up to and including death, or upon the discretion of your surgeon and anesthesiologist your surgery may need to be rescheduled.    _x___ 1. Do not eat food or drink liquids after midnight. No gum chewing or hard candies.     __x__ 2. No Alcohol for 24 hours before or after surgery.   ____ 3. Bring all medications with you on the day of surgery if instructed.    __x__ 4. Notify your doctor if there is any change in your medical condition     (cold, fever, infections).     Do not wear jewelry, make-up, hairpins, clips or nail polish.  Do not wear lotions, powders, or perfumes. You may wear deodorant.  Do not shave 48 hours prior to surgery. Men may shave face and neck.  Do not bring valuables to the hospital.    [facility] is not responsible for any belongings or valuables.               Contacts, dentures or bridgework may not be worn into surgery.  Leave your suitcase in the car. After surgery it may be brought to your room.  For patients admitted to the hospital, discharge time is determined by your                treatment team.   Patients discharged the day of surgery will not be allowed to drive home.   Please read over the following fact sheets that you were given:   MRSA Information and Surgical Site Infection Prevention   __x__ Take these medicines the morning of surgery with A SIP OF WATER:    1. tylenol  2. quinapril  3. prilosec  4.  5.  6.  ____ Fleet Enema (as directed)   __x__ Use CHG Soap as directed  ____ Use inhalers on the day of surgery  ____ Stop metformin 2 days prior to surgery    ____ Take 1/2 of usual insulin dose the night before surgery and none on the morning of surgery.   ___x_ Stop  Coumadin/Plavix/aspirin on Eloquis 02/26/15  _x___ Stop Anti-inflammatories on Tylenol only until after surgery   __x__ Stop supplements until after surgery. today   ____ Bring C-Pap to the hospital.

## 2015-02-24 NOTE — OR Nursing (Signed)
Abnormal EKG with ischemia sent to Dr. Saralyn Pilar and Anesthesia for review

## 2015-02-24 NOTE — OR Nursing (Signed)
Dr. Ronelle Nigh reviewed EKG and said Dr. Murlean Caller should be notified to compare with previous EKG's.

## 2015-03-01 ENCOUNTER — Encounter: Payer: Self-pay | Admitting: Anesthesiology

## 2015-03-01 ENCOUNTER — Ambulatory Visit
Admission: RE | Admit: 2015-03-01 | Discharge: 2015-03-01 | Disposition: A | Discharge status: Home / Self Care | Payer: Commercial Managed Care - HMO | Source: Ambulatory Visit | Attending: Cardiology | Admitting: Cardiology

## 2015-03-01 ENCOUNTER — Encounter
Admission: RE | Disposition: A | Discharge status: Home / Self Care | Payer: Self-pay | Source: Ambulatory Visit | Attending: Cardiology

## 2015-03-01 ENCOUNTER — Ambulatory Visit: Payer: Commercial Managed Care - HMO | Admitting: Certified Registered Nurse Anesthetist

## 2015-03-01 DIAGNOSIS — Z45018 Encounter for adjustment and management of other part of cardiac pacemaker: Secondary | ICD-10-CM | POA: Diagnosis not present

## 2015-03-01 DIAGNOSIS — I495 Sick sinus syndrome: Secondary | ICD-10-CM | POA: Diagnosis not present

## 2015-03-01 DIAGNOSIS — Z888 Allergy status to other drugs, medicaments and biological substances status: Secondary | ICD-10-CM | POA: Insufficient documentation

## 2015-03-01 DIAGNOSIS — Z882 Allergy status to sulfonamides status: Secondary | ICD-10-CM | POA: Insufficient documentation

## 2015-03-01 DIAGNOSIS — Z7984 Long term (current) use of oral hypoglycemic drugs: Secondary | ICD-10-CM | POA: Insufficient documentation

## 2015-03-01 DIAGNOSIS — Z800 Family history of malignant neoplasm of digestive organs: Secondary | ICD-10-CM | POA: Insufficient documentation

## 2015-03-01 DIAGNOSIS — Z818 Family history of other mental and behavioral disorders: Secondary | ICD-10-CM | POA: Diagnosis not present

## 2015-03-01 DIAGNOSIS — E1122 Type 2 diabetes mellitus with diabetic chronic kidney disease: Secondary | ICD-10-CM | POA: Insufficient documentation

## 2015-03-01 DIAGNOSIS — R079 Chest pain, unspecified: Secondary | ICD-10-CM | POA: Diagnosis not present

## 2015-03-01 DIAGNOSIS — I2510 Atherosclerotic heart disease of native coronary artery without angina pectoris: Secondary | ICD-10-CM | POA: Diagnosis not present

## 2015-03-01 DIAGNOSIS — Z7901 Long term (current) use of anticoagulants: Secondary | ICD-10-CM | POA: Diagnosis not present

## 2015-03-01 DIAGNOSIS — E782 Mixed hyperlipidemia: Secondary | ICD-10-CM | POA: Diagnosis not present

## 2015-03-01 DIAGNOSIS — J449 Chronic obstructive pulmonary disease, unspecified: Secondary | ICD-10-CM | POA: Diagnosis not present

## 2015-03-01 DIAGNOSIS — I480 Paroxysmal atrial fibrillation: Secondary | ICD-10-CM | POA: Insufficient documentation

## 2015-03-01 DIAGNOSIS — I429 Cardiomyopathy, unspecified: Secondary | ICD-10-CM | POA: Insufficient documentation

## 2015-03-01 DIAGNOSIS — M8580 Other specified disorders of bone density and structure, unspecified site: Secondary | ICD-10-CM | POA: Diagnosis not present

## 2015-03-01 DIAGNOSIS — Z79899 Other long term (current) drug therapy: Secondary | ICD-10-CM | POA: Insufficient documentation

## 2015-03-01 DIAGNOSIS — Z8711 Personal history of peptic ulcer disease: Secondary | ICD-10-CM | POA: Insufficient documentation

## 2015-03-01 DIAGNOSIS — N183 Chronic kidney disease, stage 3 (moderate): Secondary | ICD-10-CM | POA: Insufficient documentation

## 2015-03-01 DIAGNOSIS — Z9889 Other specified postprocedural states: Secondary | ICD-10-CM | POA: Insufficient documentation

## 2015-03-01 DIAGNOSIS — I1310 Hypertensive heart and chronic kidney disease without heart failure, with stage 1 through stage 4 chronic kidney disease, or unspecified chronic kidney disease: Secondary | ICD-10-CM | POA: Diagnosis not present

## 2015-03-01 DIAGNOSIS — Z720 Tobacco use: Secondary | ICD-10-CM | POA: Diagnosis not present

## 2015-03-01 DIAGNOSIS — I509 Heart failure, unspecified: Secondary | ICD-10-CM | POA: Diagnosis not present

## 2015-03-01 DIAGNOSIS — R001 Bradycardia, unspecified: Secondary | ICD-10-CM | POA: Diagnosis not present

## 2015-03-01 HISTORY — PX: PACEMAKER INSERTION: SHX728

## 2015-03-01 LAB — GLUCOSE, CAPILLARY
GLUCOSE-CAPILLARY: 114 mg/dL — AB (ref 65–99)
Glucose-Capillary: 92 mg/dL (ref 65–99)

## 2015-03-01 SURGERY — INSERTION, CARDIAC PACEMAKER
Anesthesia: Monitor Anesthesia Care

## 2015-03-01 MED ORDER — CEFAZOLIN SODIUM 1-5 GM-% IV SOLN
1.0000 g | Freq: Once | INTRAVENOUS | Status: DC
Start: 2015-03-01 — End: 2015-03-01

## 2015-03-01 MED ORDER — FENTANYL CITRATE (PF) 100 MCG/2ML IJ SOLN
25.0000 ug | INTRAMUSCULAR | Status: DC | PRN
Start: 2015-03-01 — End: 2015-03-01

## 2015-03-01 MED ORDER — SODIUM CHLORIDE 0.9 % IR SOLN: Status: DC | PRN

## 2015-03-01 MED ORDER — SODIUM CHLORIDE 0.9 % IJ SOLN
INTRAMUSCULAR | Status: AC
  Filled 2015-03-01: qty 50

## 2015-03-01 MED ORDER — VANCOMYCIN HCL IN DEXTROSE 1-5 GM/200ML-% IV SOLN
1000.0000 mg | Freq: Once | INTRAVENOUS | Status: AC
  Administered 2015-03-01: 1000 mg via INTRAVENOUS

## 2015-03-01 MED ORDER — ONDANSETRON HCL 4 MG/2ML IJ SOLN: 4.0000 mg | Freq: Four times a day (QID) | INTRAMUSCULAR | Status: DC | PRN

## 2015-03-01 MED ORDER — GENTAMICIN SULFATE 40 MG/ML IJ SOLN
INTRAMUSCULAR | Status: AC
  Filled 2015-03-01: qty 2

## 2015-03-01 MED ORDER — SODIUM CHLORIDE 0.9 % IV SOLN
INTRAVENOUS | Status: DC
  Administered 2015-03-01: 12:00:00 via INTRAVENOUS

## 2015-03-01 MED ORDER — SODIUM CHLORIDE 0.9 % IR SOLN
Freq: Once | Status: AC
  Administered 2015-03-01: 120 mL

## 2015-03-01 MED ORDER — CEPHALEXIN 250 MG PO CAPS
250.0000 mg | ORAL_CAPSULE | Freq: Four times a day (QID) | ORAL | Status: DC
Start: 2015-03-01 — End: 2017-02-01

## 2015-03-01 MED ORDER — PROPOFOL 500 MG/50ML IV EMUL
INTRAVENOUS | Status: DC | PRN
  Administered 2015-03-01: 50 ug/kg/min via INTRAVENOUS

## 2015-03-01 MED ORDER — LIDOCAINE 1 % OPTIME INJ - NO CHARGE
INTRAMUSCULAR | Status: DC | PRN
  Administered 2015-03-01: 20 mL

## 2015-03-01 MED ORDER — CEFAZOLIN SODIUM 1-5 GM-% IV SOLN
INTRAVENOUS | Status: AC
  Filled 2015-03-01: qty 50

## 2015-03-01 MED ORDER — OXYCODONE HCL 5 MG/5ML PO SOLN: 5.0000 mg | Freq: Once | ORAL | Status: DC | PRN

## 2015-03-01 MED ORDER — OXYCODONE HCL 5 MG PO TABS: 5.0000 mg | ORAL_TABLET | Freq: Once | ORAL | Status: DC | PRN

## 2015-03-01 MED ORDER — ACETAMINOPHEN 325 MG PO TABS: 325.0000 mg | ORAL_TABLET | ORAL | Status: DC | PRN

## 2015-03-01 MED ORDER — VANCOMYCIN HCL IN DEXTROSE 1-5 GM/200ML-% IV SOLN
INTRAVENOUS | Status: AC
  Administered 2015-03-01: 1000 mg via INTRAVENOUS
  Filled 2015-03-01: qty 200

## 2015-03-01 MED ORDER — DEXTROSE 5 % IV SOLN: 1000.0000 mg | Freq: Once | INTRAVENOUS | Status: DC

## 2015-03-01 MED ORDER — SODIUM CHLORIDE 0.9 % IJ SOLN
INTRAMUSCULAR | Status: DC | PRN
  Administered 2015-03-01: 50 mL via INTRAVENOUS

## 2015-03-01 MED ORDER — FENTANYL CITRATE (PF) 100 MCG/2ML IJ SOLN
INTRAMUSCULAR | Status: DC | PRN
  Administered 2015-03-01 (×4): 25 ug via INTRAVENOUS

## 2015-03-01 SURGICAL SUPPLY — 31 items
BAG DECANTER FOR FLEXI CONT (MISCELLANEOUS) ×3 IMPLANT
BRUSH SCRUB 4% CHG (MISCELLANEOUS) ×3 IMPLANT
CABLE SURG 12 DISP A/V CHANNEL (MISCELLANEOUS) ×3 IMPLANT
CANISTER SUCT 1200ML W/VALVE (MISCELLANEOUS) ×3 IMPLANT
CHLORAPREP W/TINT 26ML (MISCELLANEOUS) ×3 IMPLANT
COVER LIGHT HANDLE STERIS (MISCELLANEOUS) ×6 IMPLANT
COVER MAYO STAND STRL (DRAPES) ×3 IMPLANT
DRAPE C-ARM XRAY 36X54 (DRAPES) ×3 IMPLANT
DRESSING TELFA 4X3 1S ST N-ADH (GAUZE/BANDAGES/DRESSINGS) ×3 IMPLANT
DRSG TEGADERM 4X4.75 (GAUZE/BANDAGES/DRESSINGS) ×3 IMPLANT
GLOVE BIO SURGEON STRL SZ7.5 (GLOVE) ×3 IMPLANT
GLOVE BIO SURGEON STRL SZ8 (GLOVE) ×3 IMPLANT
GOWN STRL REUS W/ TWL LRG LVL3 (GOWN DISPOSABLE) ×1 IMPLANT
GOWN STRL REUS W/ TWL XL LVL3 (GOWN DISPOSABLE) ×1 IMPLANT
GOWN STRL REUS W/TWL LRG LVL3 (GOWN DISPOSABLE) ×2
GOWN STRL REUS W/TWL XL LVL3 (GOWN DISPOSABLE) ×2
IMMOBILIZER SHDR MD LX WHT (SOFTGOODS) IMPLANT
IMMOBILIZER SHDR XL LX WHT (SOFTGOODS) IMPLANT
INTRO PACEMKR SHEATH II 7FR (MISCELLANEOUS) ×3
INTRODUCER PACEMKR SHTH II 7FR (MISCELLANEOUS) ×1 IMPLANT
IV NS 500ML (IV SOLUTION) ×2
IV NS 500ML BAXH (IV SOLUTION) ×1 IMPLANT
KIT RM TURNOVER STRD PROC AR (KITS) ×3 IMPLANT
LABEL OR SOLS (LABEL) ×3 IMPLANT
MARKER SKIN W/RULER 31145785 (MISCELLANEOUS) ×3 IMPLANT
PACEMAKER ADAPTA DR ADDR01 (Pacemaker) ×1 IMPLANT
PACK PACE INSERTION (MISCELLANEOUS) ×3 IMPLANT
PAD GROUND ADULT SPLIT (MISCELLANEOUS) ×3 IMPLANT
PAD STATPAD (MISCELLANEOUS) ×3 IMPLANT
PPM ADAPTA DR ADDR01 (Pacemaker) ×3 IMPLANT
SUT SILK 0 SH 30 (SUTURE) ×9 IMPLANT

## 2015-03-01 NOTE — H&P (Signed)
Printout Information Document Contents Office Visit Document Received Date 02/23/2015 Document Source Organization [facility] Patient Demographics  Reason for Visit  Encounter Details  Instructions  Progress Notes  Plan of Care  Visit Diagnoses  Document Information Encounter Summary - Johnathan Hudson [id number] y.o. Male)As of Nov. 30, 2016 Patient Demographics Patient Address Communication Language Race / Ethnicity  [address] [place], [place] [address]  [phone] [facility]) [phone] (Mobile)  English (Preferred) Caucasian/White / Not Hispanic/Latino  Reason for Visit Reason Comments  Follow-up pacer ERI  Encounter Details Date Type Department Care Team Description  02/22/2015 Office Visit [facility]  [place], [place] [address]  [phone]  Isaias Cowman, [place]  [facility]  [place], [place] [address]  [phone]  [phone] (Fax)  Essential hypertension (Primary Dx);Paroxysmal a-fib;Controlled type 2 diabetes mellitus without complication, without long-term current use of insulin;Chest pain, unspecified type;Mixed hyperlipidemia;Pacemaker;SOB (shortness of breath);Statin intolerance  Instructions Patient Instructions - Isaias Cowman, MD - 02/22/2015 3:00 PM EST DASH Eating Plan DASH stands for "Dietary Approaches to Stop Hypertension." The DASH eating plan is a healthy eating plan that has been shown to reduce high blood pressure (hypertension). Additional health benefits may include reducing the risk of type 2 diabetes mellitus, heart disease, and stroke. The DASH eating plan may also help with weight loss. WHAT DO I NEED TO KNOW ABOUT THE DASH EATING PLAN? For the DASH eating plan, you will follow these general guidelines:  Choose foods with a percent daily value for sodium of less than 5% (as listed on the food label).  Use salt-free seasonings or herbs  instead of table salt or sea salt.  Check with your health care provider or pharmacist before using salt substitutes.  Eat lower-sodium products, often labeled as "lower sodium" or "no salt added."  Eat fresh foods.  Eat more vegetables, fruits, and low-fat dairy products.  Choose whole grains. Look for the word "whole" as the first word in the ingredient list.  Choose fish and skinless chicken or [place] more often than red meat. Limit fish, poultry, and meat to 6 oz (170 g) each day.  Limit sweets, desserts, sugars, and sugary drinks.  Choose heart-healthy fats.  Limit cheese to 1 oz (28 g) per day.  Eat more home-cooked food and less restaurant, buffet, and fast food.  Limit fried foods.  Cook foods using methods other than frying.  Limit canned vegetables. If you do use them, rinse them well to decrease the sodium.  When eating at a restaurant, ask that your food be prepared with less salt, or no salt if possible. WHAT FOODS CAN I EAT? Seek help from a dietitian for individual calorie needs. Grains Whole grain or whole wheat bread. Brown rice. Whole grain or whole wheat pasta. Quinoa, bulgur, and whole grain cereals. Low-sodium cereals. Corn or whole wheat flour tortillas. Whole grain cornbread. Whole grain crackers. Low-sodium crackers. Vegetables Fresh or frozen vegetables (raw, steamed, roasted, or grilled). Low-sodium or reduced-sodium tomato and vegetable juices. Low-sodium or reduced-sodium tomato sauce and paste. Low-sodium or reduced-sodium canned vegetables.  Fruits All fresh, canned (in natural juice), or frozen fruits. Meat and Other Protein Products Ground beef (85% or leaner), grass-fed beef, or beef trimmed of fat. Skinless chicken or [place]. Ground chicken or [place]. Pork trimmed of fat. All fish and seafood. Eggs. Dried beans, peas, or lentils. Unsalted nuts and seeds. Unsalted canned beans. Dairy Low-fat dairy products, such as  skim or 1% milk, 2% or  reduced-fat cheeses, low-fat ricotta or cottage cheese, or plain low-fat yogurt. Low-sodium or reduced-sodium cheeses. Fats and Oils Tub margarines without trans fats. Light or reduced-fat mayonnaise and salad dressings (reduced sodium). Avocado. Safflower, olive, or canola oils. Natural peanut or almond butter. Other Unsalted popcorn and pretzels. The items listed above may not be a complete list of recommended foods or beverages. Contact your dietitian for more options. WHAT FOODS ARE NOT RECOMMENDED? Grains White bread. White pasta. White rice. Refined cornbread. Bagels and croissants. Crackers that contain trans fat. Vegetables Creamed or fried vegetables. Vegetables in a cheese sauce. Regular canned vegetables. Regular canned tomato sauce and paste. Regular tomato and vegetable juices. Fruits Dried fruits. Canned fruit in light or heavy syrup. Fruit juice. Meat and Other Protein Products Fatty cuts of meat. Ribs, chicken wings, bacon, sausage, bologna, salami, chitterlings, fatback, hot dogs, bratwurst, and packaged luncheon meats. Salted nuts and seeds. Canned beans with salt. Dairy Whole or 2% milk, cream, half-and-half, and cream cheese. Whole-fat or sweetened yogurt. Full-fat cheeses or blue cheese. Nondairy creamers and whipped toppings. Processed cheese, cheese spreads, or cheese curds. Condiments Onion and garlic salt, seasoned salt, table salt, and sea salt. Canned and packaged gravies. Worcestershire sauce. Tartar sauce. Barbecue sauce. Teriyaki sauce. Soy sauce, including reduced sodium. Steak sauce. Fish sauce. Oyster sauce. Cocktail sauce. Horseradish. Ketchup and mustard. Meat flavorings and tenderizers. Bouillon cubes. Hot sauce. Tabasco sauce. Marinades. Taco seasonings. Relishes. Fats and Oils Butter, stick margarine, lard, shortening, ghee, and bacon fat. Coconut, palm kernel, or palm oils. Regular salad dressings. Other Pickles and olives. Salted popcorn and  pretzels. The items listed above may not be a complete list of foods and beverages to avoid. Contact your dietitian for more information. WHERE CAN I FIND MORE INFORMATION? National Heart, Lung, and Blood Institute: travelstabloid.com  This information is not intended to replace advice given to you by your health care provider. Make sure you discuss any questions you have with your health care provider.  Document Released: 03/01/2011 Document Revised: 04/02/2014 Document Reviewed: 01/14/2013 Elsevier Interactive Patient Education 2016 Elsevier Inc. Fat and Cholesterol Restricted Diet High levels of fat and cholesterol in your blood may lead to various health problems, such as diseases of the heart, blood vessels, gallbladder, liver, and pancreas. Fats are concentrated sources of energy that come in various forms. Certain types of fat, including saturated fat, may be harmful in excess. Cholesterol is a substance needed by your body in small amounts. Your body makes all the cholesterol it needs. Excess cholesterol comes from the food you eat. When you have high levels of cholesterol and saturated fat in your blood, health problems can develop because the excess fat and cholesterol will gather along the walls of your blood vessels, causing them to narrow. Choosing the right foods will help you control your intake of fat and cholesterol. This will help keep the levels of these substances in your blood within normal limits and reduce your risk of disease. WHAT IS MY PLAN? Your health care provider recommends that you:  Get no more than __________ % of the total calories in your daily diet from fat.  Limit your intake of saturated fat to less than ______% of your total calories each day.  Limit the amount of cholesterol in your diet to less than _________mg per day. WHAT TYPES OF FAT SHOULD I CHOOSE?  Choose healthy fats more often. Choose monounsaturated and  polyunsaturated fats, such as olive  and canola oil, flaxseeds, walnuts, almonds, and seeds.  Eat more omega-3 fats. Good choices include salmon, mackerel, sardines, tuna, flaxseed oil, and ground flaxseeds. Aim to eat fish at least two times a week.  Limit saturated fats. Saturated fats are primarily found in animal products, such as meats, butter, and cream. Plant sources of saturated fats include palm oil, palm kernel oil, and coconut oil.  Avoid foods with partially hydrogenated oils in them. These contain trans fats. Examples of foods that contain trans fats are stick margarine, some tub margarines, cookies, crackers, and other baked goods. WHAT GENERAL GUIDELINES DO I NEED TO FOLLOW? These guidelines for healthy eating will help you control your intake of fat and cholesterol:  Check food labels carefully to identify foods with trans fats or high amounts of saturated fat.  Fill one half of your plate with vegetables and green salads.  Fill one fourth of your plate with whole grains. Look for the word "whole" as the first word in the ingredient list.  Fill one fourth of your plate with lean protein foods.  Limit fruit to two servings a day. Choose fruit instead of juice.  Eat more foods that contain soluble fiber. Examples of foods that contain this type of fiber are apples, broccoli, carrots, beans, peas, and barley. Aim to get 20-30 g of fiber per day.  Eat more home-cooked food and less restaurant, buffet, and fast food.  Limit or avoid alcohol.  Limit foods high in starch and sugar.  Limit fried foods.  Cook foods using methods other than frying. Baking, boiling, grilling, and broiling are all great options.  Lose weight if you are overweight. Losing just 5-10% of your initial body weight can help your overall health and prevent diseases such as diabetes and heart disease. WHAT FOODS CAN I EAT? Grains Whole grains, such as whole wheat or whole grain breads, crackers,  cereals, and pasta. Unsweetened oatmeal, bulgur, barley, quinoa, or brown rice. Corn or whole wheat flour tortillas. Vegetables Fresh or frozen vegetables (raw, steamed, roasted, or grilled). Green salads. Fruits All fresh, canned (in natural juice), or frozen fruits. Meat and Other Protein Products Ground beef (85% or leaner), grass-fed beef, or beef trimmed of fat. Skinless chicken or [place]. Ground chicken or [place]. Pork trimmed of fat. All fish and seafood. Eggs. Dried beans, peas, or lentils. Unsalted nuts or seeds. Unsalted canned or dry beans. Dairy Low-fat dairy products, such as skim or 1% milk, 2% or reduced-fat cheeses, low-fat ricotta or cottage cheese, or plain low-fat yogurt. Fats and Oils Tub margarines without trans fats. Light or reduced-fat mayonnaise and salad dressings. Avocado. Olive, canola, sesame, or safflower oils. Natural peanut or almond butter (choose ones without added sugar and oil). The items listed above may not be a complete list of recommended foods or beverages. Contact your dietitian for more options. WHAT FOODS ARE NOT RECOMMENDED? Grains White bread. White pasta. White rice. Cornbread. Bagels, pastries, and croissants. Crackers that contain trans fat. Vegetables White potatoes. Corn. Creamed or fried vegetables. Vegetables in a cheese sauce. Fruits Dried fruits. Canned fruit in light or heavy syrup. Fruit juice. Meat and Other Protein Products Fatty cuts of meat. Ribs, chicken wings, bacon, sausage, bologna, salami, chitterlings, fatback, hot dogs, bratwurst, and packaged luncheon meats. Liver and organ meats. Dairy Whole or 2% milk, cream, half-and-half, and cream cheese. Whole milk cheeses. Whole-fat or sweetened yogurt. Full-fat cheeses. Nondairy creamers and whipped toppings. Processed cheese, cheese spreads, or cheese curds. Sweets and Desserts  Corn syrup, sugars, honey, and molasses. Candy. Jam and jelly. Syrup. Sweetened cereals. Cookies, pies,  cakes, donuts, muffins, and ice cream. Fats and Oils Butter, stick margarine, lard, shortening, ghee, or bacon fat. Coconut, palm kernel, or palm oils. Beverages Alcohol. Sweetened drinks (such as sodas, lemonade, and fruit drinks or punches). The items listed above may not be a complete list of foods and beverages to avoid. Contact your dietitian for more information.  This information is not intended to replace advice given to you by your health care provider. Make sure you discuss any questions you have with your health care provider.  Document Released: 03/12/2005 Document Revised: 04/02/2014 Document Reviewed: 06/10/2013 Elsevier Interactive Patient Education 2016 [place] Notes Isaias Cowman, MD - 02/22/2015 3:00 PM EST Formatting of this note may be different from the original. Established Patient Visit   Chief Complaint: Chief Complaint  Patient presents with  . Follow-up  pacer ERI  Date of Service: 02/22/2015 Date of Birth: May 24, 1933 PCP: Azzie Glatter, MD  History of Present Illness: Mr. Grilley is a 81 y.o.male patient who returns for  1. Paroxysmal atrial fibrillation 2. Dual-chamber pacemaker for junctional rhythm 3. Essential hypertension 4. Hyperlipidemia 5. Mild cardiomyopathy 6. COPD 7. Type 2 diabetes  The patient denies chest pain. He does have chronic exertional dyspnea due to underlying COPD. He denies palpitations or heart racing. He denies peripheral edema. The patient is active doing yard work but does not do any structured exercise. Pacemaker interrogation performed earlier today revealed he elective replacement indication.  The patient has paroxysmal atrial fibrillation, chads Vasc score 4, currently on Eliquis for stroke prevention. The patient denies any bleeding side effects.   The patient has essential hypertension, blood pressure l well-controlled on quinapril which is tolerated well. The patient follows a  low-sodium diet.  The patient has hyperlipidemia, LDL cholesterol 66 on 07/26/2014, currently on simvastatin, which is tolerated well without apparent side effects. The patient follows a low-cholesterol diet.  The patient has type 2 diabetes, hemoglobin A1c was 6.8 on 11/26/2014, currently on glipizide, which is tolerated well without apparent side effects. The patient follows a low carbohydrate, ADA diet.  Past Medical and Surgical History  Past Medical History Past Medical History  Diagnosis Date  . Atrial fibrillation, unspecified  . Chronic kidney disease, stage 3  . Difficulty walking 08/24/2013  . Hyperlipidemia  . Hypertension  . Inflammatory arthritis  a. Psoriasis. b. S/p methotrexate. c. S/p steroids. d. DISH like changes on spine films.  . Junctional bradycardia 10/31/07  . Osteopenia  . PUD (peptic ulcer disease)  . Tremor 08/24/2013  . Type 2 diabetes mellitus   Past Surgical History He has a past surgical history that includes Insert / replace / remove pacemaker; Nephrostomy / nephrotomy; Penile implant; insertion dual chamber pacemaker generator (11/11/07); Tonsillectomy; and ORIF ankle fracture bimalleolar (Right, 08/2010).   Medications and Allergies  Current Medications  Current Outpatient Prescriptions  Medication Sig Dispense Refill  . acetaminophen-codeine (TYLENOL #3) 300-30 mg tablet 1 tab po twice a day prn 60 tablet 0  . ALPRAZolam (XANAX) 0.25 MG tablet Take 0.25 mg by mouth nightly as needed for Sleep.  Marland Kitchen ELIQUIS 2.5 mg tablet TAKE 1 TABLET TWICE DAILY 180 tablet 4  . gabapentin (NEURONTIN) 300 MG capsule Take 1 tab in the afternoon and 2 tabs at night 90 capsule 3  . glipiZIDE (GLUCOTROL) 2.5 MG XL tablet TAKE 1 TABLET EVERY DAY 90 tablet 2  .  LORazepam (ATIVAN) 0.5 MG tablet TAKE 1 TABLET BY MOUTH TWICE A DAY AS NEEDED FOR ANXIETY 60 tablet 3  . omega-3 fatty acids/fish oil 340-1,000 mg capsule Take 1 capsule by mouth 2 (two) times daily.  Marland Kitchen omeprazole  (PRILOSEC) 20 MG DR capsule TAKE 1 CAPSULE TWICE DAILY 180 capsule 3  . quinapril (ACCUPRIL) 10 MG tablet TAKE 1 TABLET ONE TIME DAILY 90 tablet 2  . sertraline (ZOLOFT) 50 MG tablet TAKE 1 TABLET ONE TIME DAILY 90 tablet 1  . simvastatin (ZOCOR) 20 MG tablet TAKE 1 TABLET ONE TIME DAILY (NEW DOSE) 90 tablet 1   No current facility-administered medications for this visit.   Allergies: Lipitor [atorvastatin] and Sulfa (sulfonamide antibiotics)  Social and Family History  Social History reports that he quit smoking about 45 years ago. His smokeless tobacco use includes Chew. He reports that he does not drink alcohol or use illicit drugs.  Family History Family History  Problem Relation Age of Onset  . Stomach cancer Mother  . Alzheimer's disease Father   Review of Systems   Review of Systems: The patient denies chest pain, reports chronic exertional shortness of breath, without orthopnea, paroxysmal nocturnal dyspnea, pedal edema, palpitations, heart racing, presyncope, syncope. Review of 12 Systems is negative except as described above.  Physical Examination   Vitals: Visit Vitals  . BP (P) 132/60 (BP Location: Left upper arm, Patient Position: Sitting, BP Cuff Size: Adult)  . Pulse (P) 56  . Ht (P) 167.6 cm (5\' 6" )  . Wt (P) 76.1 kg (167 lb 12.8 oz)  . BMI (P) 27.08 kg/m2   Ht:(P) 167.6 cm (5\' 6" ) Wt:(P) 76.1 kg (167 lb 12.8 oz) [id number] surface area is 1.88 meters squared (pended). Body mass index is 27.08 kg/(m^2) (pended).  HEENT: Pupils equally reactive to light and accomodation  Neck: Supple without thyromegaly, carotid pulses 2+ Lungs: clear to auscultation bilaterally; no wheezes, rales, rhonchi Heart: Regular rate and rhythm. No gallops, murmurs or rub Abdomen: soft nontender, nondistended, with normal bowel sounds Extremities: no cyanosis, clubbing, or edema Peripheral Pulses: 2+ in all extremities, 2+ femoral pulses bilaterally  Assessment   81 y.o. male with   1. Essential hypertension  2. Paroxysmal a-fib  3. Controlled type 2 diabetes mellitus without complication, without long-term current use of insulin  4. Chest pain, unspecified type  5. Mixed hyperlipidemia  6. Pacemaker  7. SOB (shortness of breath)  8. Statin intolerance   81 year old gentleman with paroxysmal atrial fibrillation, chads Vasc score 4, currently on Eliquis. Patient has dual-chamber pacemaker for junctional rhythm with recent pacemaker interrogation showing elective replacement indication. Patient has essential hypertension, blood pressure low normal on current BP medications. Patient has hyperlipidemia with excellent control of LDL cholesterol on simvastatin.  Plan   1. Continue current medications 2. Continue Eliquis for stroke prevention 3. Counseled patient about low-sodium diet 4. DASH diet printed instructions given to the patient 5. Counseled patient about low-cholesterol diet 6. Continue simvastatin for hyperlipidemia management  7. Low-fat and cholesterol diet printed instructions given to the patient  8. Diabetes diet printed instructions given to the patient 9. Pacemaker generator change out scheduled for 03/01/2015. The risks, benefits alternatives were explained to the patient and informed written consent was obtained. 10. Hold Eliquis 2 days prior 2 pacemaker generator change out  No orders of the defined types were placed in this encounter.  Return in about 2 weeks (around 03/08/2015).  Isaias Cowman, MD    Plan of Care  Upcoming Encounters Upcoming Encounters  Date Type Specialty Providers Description  03/08/2015 Appointment Cardiology Shizuo Biskup, Sheppard Coil, MD  [place]  [facility]  [place], [place] [address]  [phone]  [phone] (Fax)    04/04/2015 Appointment Lab Azzie Glatter, MD  [address]  [place], [place] [address]  [phone]   [phone] (Fax)    04/11/2015 Appointment Internal Medicine Azzie Glatter, MD  [address]  [place], [place] [address]  [phone]  [phone] (Fax)    05/12/2015 Appointment Cardiology Saralyn Pilar, Sheppard Coil, MD  [place]  [facility]  [place], [place] [address]  [phone]  [phone] (Fax)    Visit Diagnoses   Essential hypertension - Primary   Paroxysmal a-fib   Controlled type 2 diabetes mellitus without complication, without long-term current use of insulin   Chest pain, unspecified type   Mixed hyperlipidemia Mixed hyperlipidemia   Pacemaker Cardiac pacemaker in situ   SOB (shortness of breath) Shortness of breath   Statin intolerance Document Information Primary Care Provider Vishwanath Handattur Hande (Mar. 27, 2015 - Present) [phone] (Work) [phone] (Fax)  [address] [place], [place] [address] Document Coverage Dates Nov. 29, 2016 - Nov. 29, 2016 [place] [phone] (Work) [place], [place] [address] Encounter Providers Isaias Cowman (Attending) [phone] (Work) [phone] (Fax)  [facility] [place], [place] [address] Encounter Date Nov. 29, 2016 - Nov. 29, 2016 Printout Information Document Contents Office Visit Document Received Date 02/23/2015 Document Source Organization [facility] Patient Demographics  Reason for Visit  Encounter Details  Instructions  Progress Notes  Plan of Care  Visit Diagnoses  Document Information Encounter Summary - Johnathan Hudson [id number] y.o. Male)As of Nov. 30, 2016 Patient Demographics Patient Address Communication Language Race / Ethnicity  [address] [place], [place] [address]  [phone] (Home) [phone] (Mobile)  English (Preferred) Caucasian/White / Not  Hispanic/Latino  Reason for Visit Reason Comments  Follow-up pacer ERI  Encounter Details Date Type Department Care Team Description  02/22/2015 Office Visit [facility]  [place], [place] [address]  [phone]  Isaias Cowman, Eugenio Saenz [place]  [facility]  [place], [place] [address]  [phone]  [phone] (Fax)  Essential hypertension (Primary Dx);Paroxysmal a-fib;Controlled type 2 diabetes mellitus without complication, without long-term current use of insulin;Chest pain, unspecified type;Mixed hyperlipidemia;Pacemaker;SOB (shortness of breath);Statin intolerance  Instructions Patient Instructions - Isaias Cowman, MD - 02/22/2015 3:00 PM EST DASH Eating Plan DASH stands for "Dietary Approaches to Stop Hypertension." The DASH eating plan is a healthy eating plan that has been shown to reduce high blood pressure (hypertension). Additional health benefits may include reducing the risk of type 2 diabetes mellitus, heart disease, and stroke. The DASH eating plan may also help with weight loss. WHAT DO I NEED TO KNOW ABOUT THE DASH EATING PLAN? For the DASH eating plan, you will follow these general guidelines:  Choose foods with a percent daily value for sodium of less than 5% (as listed on the food label).  Use salt-free seasonings or herbs instead of table salt or sea salt.  Check with your health care provider or pharmacist before using salt substitutes.  Eat lower-sodium products, often labeled as "lower sodium" or "no salt added."  Eat fresh foods.  Eat more vegetables, fruits, and low-fat dairy products.  Choose whole grains. Look for the  word "whole" as the first word in the ingredient list.  Choose fish and skinless chicken or [place] more often than red meat. Limit fish, poultry, and meat to 6 oz (170 g) each day.  Limit sweets, desserts, sugars, and sugary drinks.  Choose heart-healthy  fats.  Limit cheese to 1 oz (28 g) per day.  Eat more home-cooked food and less restaurant, buffet, and fast food.  Limit fried foods.  Cook foods using methods other than frying.  Limit canned vegetables. If you do use them, rinse them well to decrease the sodium.  When eating at a restaurant, ask that your food be prepared with less salt, or no salt if possible. WHAT FOODS CAN I EAT? Seek help from a dietitian for individual calorie needs. Grains Whole grain or whole wheat bread. Brown rice. Whole grain or whole wheat pasta. Quinoa, bulgur, and whole grain cereals. Low-sodium cereals. Corn or whole wheat flour tortillas. Whole grain cornbread. Whole grain crackers. Low-sodium crackers. Vegetables Fresh or frozen vegetables (raw, steamed, roasted, or grilled). Low-sodium or reduced-sodium tomato and vegetable juices. Low-sodium or reduced-sodium tomato sauce and paste. Low-sodium or reduced-sodium canned vegetables.  Fruits All fresh, canned (in natural juice), or frozen fruits. Meat and Other Protein Products Ground beef (85% or leaner), grass-fed beef, or beef trimmed of fat. Skinless chicken or [place]. Ground chicken or [place]. Pork trimmed of fat. All fish and seafood. Eggs. Dried beans, peas, or lentils. Unsalted nuts and seeds. Unsalted canned beans. Dairy Low-fat dairy products, such as skim or 1% milk, 2% or reduced-fat cheeses, low-fat ricotta or cottage cheese, or plain low-fat yogurt. Low-sodium or reduced-sodium cheeses. Fats and Oils Tub margarines without trans fats. Light or reduced-fat mayonnaise and salad dressings (reduced sodium). Avocado. Safflower, olive, or canola oils. Natural peanut or almond butter. Other Unsalted popcorn and pretzels. The items listed above may not be a complete list of recommended foods or beverages. Contact your dietitian for more options. WHAT FOODS ARE NOT RECOMMENDED? Grains White bread. White pasta. White rice. Refined cornbread.  Bagels and croissants. Crackers that contain trans fat. Vegetables Creamed or fried vegetables. Vegetables in a cheese sauce. Regular canned vegetables. Regular canned tomato sauce and paste. Regular tomato and vegetable juices. Fruits Dried fruits. Canned fruit in light or heavy syrup. Fruit juice. Meat and Other Protein Products Fatty cuts of meat. Ribs, chicken wings, bacon, sausage, bologna, salami, chitterlings, fatback, hot dogs, bratwurst, and packaged luncheon meats. Salted nuts and seeds. Canned beans with salt. Dairy Whole or 2% milk, cream, half-and-half, and cream cheese. Whole-fat or sweetened yogurt. Full-fat cheeses or blue cheese. Nondairy creamers and whipped toppings. Processed cheese, cheese spreads, or cheese curds. Condiments Onion and garlic salt, seasoned salt, table salt, and sea salt. Canned and packaged gravies. Worcestershire sauce. Tartar sauce. Barbecue sauce. Teriyaki sauce. Soy sauce, including reduced sodium. Steak sauce. Fish sauce. Oyster sauce. Cocktail sauce. Horseradish. Ketchup and mustard. Meat flavorings and tenderizers. Bouillon cubes. Hot sauce. Tabasco sauce. Marinades. Taco seasonings. Relishes. Fats and Oils Butter, stick margarine, lard, shortening, ghee, and bacon fat. Coconut, palm kernel, or palm oils. Regular salad dressings. Other Pickles and olives. Salted popcorn and pretzels. The items listed above may not be a complete list of foods and beverages to avoid. Contact your dietitian for more information. WHERE CAN I FIND MORE INFORMATION? National Heart, Lung, and Blood Institute: travelstabloid.com  This information is not intended to replace advice given to you by your health care provider. Make sure you discuss any  questions you have with your health care provider.  Document Released: 03/01/2011 Document Revised: 04/02/2014 Document Reviewed: 01/14/2013 Elsevier Interactive Patient Education 2016 Elsevier  Inc. Fat and Cholesterol Restricted Diet High levels of fat and cholesterol in your blood may lead to various health problems, such as diseases of the heart, blood vessels, gallbladder, liver, and pancreas. Fats are concentrated sources of energy that come in various forms. Certain types of fat, including saturated fat, may be harmful in excess. Cholesterol is a substance needed by your body in small amounts. Your body makes all the cholesterol it needs. Excess cholesterol comes from the food you eat. When you have high levels of cholesterol and saturated fat in your blood, health problems can develop because the excess fat and cholesterol will gather along the walls of your blood vessels, causing them to narrow. Choosing the right foods will help you control your intake of fat and cholesterol. This will help keep the levels of these substances in your blood within normal limits and reduce your risk of disease. WHAT IS MY PLAN? Your health care provider recommends that you:  Get no more than __________ % of the total calories in your daily diet from fat.  Limit your intake of saturated fat to less than ______% of your total calories each day.  Limit the amount of cholesterol in your diet to less than _________mg per day. WHAT TYPES OF FAT SHOULD I CHOOSE?  Choose healthy fats more often. Choose monounsaturated and polyunsaturated fats, such as olive and canola oil, flaxseeds, walnuts, almonds, and seeds.  Eat more omega-3 fats. Good choices include salmon, mackerel, sardines, tuna, flaxseed oil, and ground flaxseeds. Aim to eat fish at least two times a week.  Limit saturated fats. Saturated fats are primarily found in animal products, such as meats, butter, and cream. Plant sources of saturated fats include palm oil, palm kernel oil, and coconut oil.  Avoid foods with partially hydrogenated oils in them. These contain trans fats. Examples of foods that contain trans fats are stick margarine,  some tub margarines, cookies, crackers, and other baked goods. WHAT GENERAL GUIDELINES DO I NEED TO FOLLOW? These guidelines for healthy eating will help you control your intake of fat and cholesterol:  Check food labels carefully to identify foods with trans fats or high amounts of saturated fat.  Fill one half of your plate with vegetables and green salads.  Fill one fourth of your plate with whole grains. Look for the word "whole" as the first word in the ingredient list.  Fill one fourth of your plate with lean protein foods.  Limit fruit to two servings a day. Choose fruit instead of juice.  Eat more foods that contain soluble fiber. Examples of foods that contain this type of fiber are apples, broccoli, carrots, beans, peas, and barley. Aim to get 20-30 g of fiber per day.  Eat more home-cooked food and less restaurant, buffet, and fast food.  Limit or avoid alcohol.  Limit foods high in starch and sugar.  Limit fried foods.  Cook foods using methods other than frying. Baking, boiling, grilling, and broiling are all great options.  Lose weight if you are overweight. Losing just 5-10% of your initial body weight can help your overall health and prevent diseases such as diabetes and heart disease. WHAT FOODS CAN I EAT? Grains Whole grains, such as whole wheat or whole grain breads, crackers, cereals, and pasta. Unsweetened oatmeal, bulgur, barley, quinoa, or brown rice. Corn or whole wheat  flour tortillas. Vegetables Fresh or frozen vegetables (raw, steamed, roasted, or grilled). Green salads. Fruits All fresh, canned (in natural juice), or frozen fruits. Meat and Other Protein Products Ground beef (85% or leaner), grass-fed beef, or beef trimmed of fat. Skinless chicken or [place]. Ground chicken or [place]. Pork trimmed of fat. All fish and seafood. Eggs. Dried beans, peas, or lentils. Unsalted nuts or seeds. Unsalted canned or dry beans. Dairy Low-fat dairy products, such as  skim or 1% milk, 2% or reduced-fat cheeses, low-fat ricotta or cottage cheese, or plain low-fat yogurt. Fats and Oils Tub margarines without trans fats. Light or reduced-fat mayonnaise and salad dressings. Avocado. Olive, canola, sesame, or safflower oils. Natural peanut or almond butter (choose ones without added sugar and oil). The items listed above may not be a complete list of recommended foods or beverages. Contact your dietitian for more options. WHAT FOODS ARE NOT RECOMMENDED? Grains White bread. White pasta. White rice. Cornbread. Bagels, pastries, and croissants. Crackers that contain trans fat. Vegetables White potatoes. Corn. Creamed or fried vegetables. Vegetables in a cheese sauce. Fruits Dried fruits. Canned fruit in light or heavy syrup. Fruit juice. Meat and Other Protein Products Fatty cuts of meat. Ribs, chicken wings, bacon, sausage, bologna, salami, chitterlings, fatback, hot dogs, bratwurst, and packaged luncheon meats. Liver and organ meats. Dairy Whole or 2% milk, cream, half-and-half, and cream cheese. Whole milk cheeses. Whole-fat or sweetened yogurt. Full-fat cheeses. Nondairy creamers and whipped toppings. Processed cheese, cheese spreads, or cheese curds. Sweets and Desserts Corn syrup, sugars, honey, and molasses. Candy. Jam and jelly. Syrup. Sweetened cereals. Cookies, pies, cakes, donuts, muffins, and ice cream. Fats and Oils Butter, stick margarine, lard, shortening, ghee, or bacon fat. Coconut, palm kernel, or palm oils. Beverages Alcohol. Sweetened drinks (such as sodas, lemonade, and fruit drinks or punches). The items listed above may not be a complete list of foods and beverages to avoid. Contact your dietitian for more information.  This information is not intended to replace advice given to you by your health care provider. Make sure you discuss any questions you have with your health care provider.  Document Released: 03/12/2005 Document Revised:  04/02/2014 Document Reviewed: 06/10/2013 Elsevier Interactive Patient Education 2016 [employer] Isaias Cowman, MD - 02/22/2015 3:00 PM EST Formatting of this note may be different from the original. Established Patient Visit   Chief Complaint: Chief Complaint  Patient presents with  . Follow-up  pacer ERI  Date of Service: 02/22/2015 Date of Birth: [birth year] PCP: Azzie Glatter, MD  History of Present Illness: Mr. Meda is a 81 y.o.male patient who returns for  1. Paroxysmal atrial fibrillation 2. Dual-chamber pacemaker for junctional rhythm 3. Essential hypertension 4. Hyperlipidemia 5. Mild cardiomyopathy 6. COPD 7. Type 2 diabetes  The patient denies chest pain. He does have chronic exertional dyspnea due to underlying COPD. He denies palpitations or heart racing. He denies peripheral edema. The patient is active doing yard work but does not do any structured exercise. Pacemaker interrogation performed earlier today revealed he elective replacement indication.  The patient has paroxysmal atrial fibrillation, chads Vasc score 4, currently on Eliquis for stroke prevention. The patient denies any bleeding side effects.   The patient has essential hypertension, blood pressure l well-controlled on quinapril which is tolerated well. The patient follows a low-sodium diet.  The patient has hyperlipidemia, LDL cholesterol 66 on 07/26/2014, currently on simvastatin, which is tolerated well without apparent side effects. The patient follows a  low-cholesterol diet.  The patient has type 2 diabetes, hemoglobin A1c was 6.8 on 11/26/2014, currently on glipizide, which is tolerated well without apparent side effects. The patient follows a low carbohydrate, ADA diet.  Past Medical and Surgical History  Past Medical History Past Medical History  Diagnosis Date  . Atrial fibrillation, unspecified  . Chronic kidney disease, stage 3  . Difficulty  walking 08/24/2013  . Hyperlipidemia  . Hypertension  . Inflammatory arthritis  a. Psoriasis. b. S/p methotrexate. c. S/p steroids. d. DISH like changes on spine films.  . Junctional bradycardia 10/31/07  . Osteopenia  . PUD (peptic ulcer disease)  . Tremor 08/24/2013  . Type 2 diabetes mellitus   Past Surgical History He has a past surgical history that includes Insert / replace / remove pacemaker; Nephrostomy / nephrotomy; Penile implant; insertion dual chamber pacemaker generator (11/11/07); Tonsillectomy; and ORIF ankle fracture bimalleolar (Right, 08/2010).   Medications and Allergies  Current Medications  Current Outpatient Prescriptions  Medication Sig Dispense Refill  . acetaminophen-codeine (TYLENOL #3) 300-30 mg tablet 1 tab po twice a day prn 60 tablet 0  . ALPRAZolam (XANAX) 0.25 MG tablet Take 0.25 mg by mouth nightly as needed for Sleep.  Marland Kitchen ELIQUIS 2.5 mg tablet TAKE 1 TABLET TWICE DAILY 180 tablet 4  . gabapentin (NEURONTIN) 300 MG capsule Take 1 tab in the afternoon and 2 tabs at night 90 capsule 3  . glipiZIDE (GLUCOTROL) 2.5 MG XL tablet TAKE 1 TABLET EVERY DAY 90 tablet 2  . LORazepam (ATIVAN) 0.5 MG tablet TAKE 1 TABLET BY MOUTH TWICE A DAY AS NEEDED FOR ANXIETY 60 tablet 3  . omega-3 fatty acids/fish oil 340-1,000 mg capsule Take 1 capsule by mouth 2 (two) times daily.  Marland Kitchen omeprazole (PRILOSEC) 20 MG DR capsule TAKE 1 CAPSULE TWICE DAILY 180 capsule 3  . quinapril (ACCUPRIL) 10 MG tablet TAKE 1 TABLET ONE TIME DAILY 90 tablet 2  . sertraline (ZOLOFT) 50 MG tablet TAKE 1 TABLET ONE TIME DAILY 90 tablet 1  . simvastatin (ZOCOR) 20 MG tablet TAKE 1 TABLET ONE TIME DAILY (NEW DOSE) 90 tablet 1   No current facility-administered medications for this visit.   Allergies: Lipitor [atorvastatin] and Sulfa (sulfonamide antibiotics)  Social and Family History  Social History reports that he quit smoking about 45 years ago. His smokeless tobacco use includes Chew. He reports that  he does not drink alcohol or use illicit drugs.  Family History Family History  Problem Relation Age of Onset  . Stomach cancer Mother  . Alzheimer's disease Father   Review of Systems   Review of Systems: The patient denies chest pain, reports chronic exertional shortness of breath, without orthopnea, paroxysmal nocturnal dyspnea, pedal edema, palpitations, heart racing, presyncope, syncope. Review of 12 Systems is negative except as described above.  Physical Examination   Vitals: Visit Vitals  . BP (P) 132/60 (BP Location: Left upper arm, Patient Position: Sitting, BP Cuff Size: Adult)  . Pulse (P) 56  . Ht (P) 167.6 cm (5\' 6" )  . Wt (P) 76.1 kg (167 lb 12.8 oz)  . BMI (P) 27.08 kg/m2   Ht:(P) 167.6 cm (5\' 6" ) Wt:(P) 76.1 kg (167 lb 12.8 oz) [id number] surface area is 1.88 meters squared (pended). Body mass index is 27.08 kg/(m^2) (pended).  HEENT: Pupils equally reactive to light and accomodation  Neck: Supple without thyromegaly, carotid pulses 2+ Lungs: clear to auscultation bilaterally; no wheezes, rales, rhonchi Heart: Regular rate and rhythm. No gallops, murmurs  or rub Abdomen: soft nontender, nondistended, with normal bowel sounds Extremities: no cyanosis, clubbing, or edema Peripheral Pulses: 2+ in all extremities, 2+ femoral pulses bilaterally  Assessment   81 y.o. male with  1. Essential hypertension  2. Paroxysmal a-fib  3. Controlled type 2 diabetes mellitus without complication, without long-term current use of insulin  4. Chest pain, unspecified type  5. Mixed hyperlipidemia  6. Pacemaker  7. SOB (shortness of breath)  8. Statin intolerance   81 year old gentleman with paroxysmal atrial fibrillation, chads Vasc score 4, currently on Eliquis. Patient has dual-chamber pacemaker for junctional rhythm with recent pacemaker interrogation showing elective replacement indication. Patient has essential hypertension, blood pressure low normal on current BP  medications. Patient has hyperlipidemia with excellent control of LDL cholesterol on simvastatin.  Plan   1. Continue current medications 2. Continue Eliquis for stroke prevention 3. Counseled patient about low-sodium diet 4. DASH diet printed instructions given to the patient 5. Counseled patient about low-cholesterol diet 6. Continue simvastatin for hyperlipidemia management  7. Low-fat and cholesterol diet printed instructions given to the patient  8. Diabetes diet printed instructions given to the patient 9. Pacemaker generator change out scheduled for 03/01/2015. The risks, benefits alternatives were explained to the patient and informed written consent was obtained. 10. Hold Eliquis 2 days prior 2 pacemaker generator change out  No orders of the defined types were placed in this encounter.  Return in about 2 weeks (around 03/08/2015).  Isaias Cowman, MD    Plan of Care Upcoming Encounters Upcoming Encounters  Date Type Specialty Providers Description  03/08/2015 Appointment Cardiology Liliane Mallis, Sheppard Coil, MD  [place]  [facility]  [place], [place] [address]  [phone]  [phone] (Fax)    04/04/2015 Appointment Lab Azzie Glatter, MD  [address]  [place], [place] [address]  [phone]  [phone] (Fax)    04/11/2015 Appointment Internal Medicine Azzie Glatter, MD  [address]  [place], [place] [address]  [phone]  [phone] (Fax)    05/12/2015 Appointment Cardiology Saralyn Pilar, Sheppard Coil, MD  [place]  [facility]  [place], [place] [address]  [phone]  [phone] (Fax)    Visit Diagnoses   Essential hypertension - Primary   Paroxysmal a-fib   Controlled type 2 diabetes mellitus without complication, without long-term current use of insulin   Chest pain, unspecified  type   Mixed hyperlipidemia Mixed hyperlipidemia   Pacemaker Cardiac pacemaker in situ   SOB (shortness of breath) Shortness of breath   Statin intolerance Document Information Primary Care Provider [facility] (Mar. 27, 2015 - Present) [phone] (Work) [phone] (Fax)  [address] [place], [place] [address] Document Coverage Dates Nov. 29, 2016 - Nov. 29, 2016 [place] [phone] (Work) [place],  [address] Encounter Providers Isaias Cowman (Attending) [phone] (Work) [phone] (Fax)  [place] [facility] [place],  [address] Encounter Date Nov. 29, 2016 - Nov. 29, 2016

## 2015-03-01 NOTE — Transfer of Care (Signed)
Immediate Anesthesia Transfer of Care Note  Patient: Johnathan Hudson  Procedure(s) Performed: Procedure(s): PACEMAKER CHANGE OUT (N/A)  Patient Location: PACU  Anesthesia Type:General  Level of Consciousness: awake, alert  and oriented  Airway & Oxygen Therapy: Patient Spontanous Breathing and Patient connected to face mask oxygen  Post-op Assessment: Report given to RN and Post -op Vital signs reviewed and stable  Post vital signs: Reviewed and stable  Last Vitals:  Filed Vitals:   03/01/15 1133  BP: 181/72  Pulse: 52  Temp: 36.9 C  Resp: 16    Complications: No complications

## 2015-03-01 NOTE — Interval H&P Note (Signed)
History and Physical Interval Note:  03/01/2015 10:56 AM  Johnathan Hudson  has presented today for surgery, with the diagnosis of sss  The various methods of treatment have been discussed with the patient and family. After consideration of risks, benefits and other options for treatment, the patient has consented to  Procedure(s): PACEMAKER CHANGE OUT (N/A) as a surgical intervention .  The patient's history has been reviewed, patient examined, no change in status, stable for surgery.  I have reviewed the patient's chart and labs.  Questions were answered to the patient's satisfaction.     Arlon Bleier

## 2015-03-01 NOTE — Anesthesia Preprocedure Evaluation (Addendum)
Anesthesia Evaluation  Patient identified by MRN, date of birth, ID band Patient awake    Reviewed: Allergy & Precautions, H&P , NPO status , Patient's Chart, lab work & pertinent test results  History of Anesthesia Complications Negative for: history of anesthetic complications  Airway Mallampati: III  TM Distance: >3 FB Neck ROM: limited    Dental  (+) Poor Dentition, Missing, Upper Dentures, Lower Dentures   Pulmonary shortness of breath, COPD, former smoker,    Pulmonary exam normal breath sounds clear to auscultation       Cardiovascular Exercise Tolerance: Poor hypertension, (-) angina+ CAD, +CHF and + DOE  (-) Past MI Normal cardiovascular exam+ dysrhythmias Atrial Fibrillation + pacemaker  Rhythm:regular Rate:Normal     Neuro/Psych negative neurological ROS  negative psych ROS   GI/Hepatic negative GI ROS, Neg liver ROS,   Endo/Other  diabetes, Type 2  Renal/GU Renal disease  negative genitourinary   Musculoskeletal   Abdominal   Peds  Hematology negative hematology ROS (+)   Anesthesia Other Findings Past Medical History:   Dysrhythmia                                                    Comment:atrial fib   Hypertension                                                 Hyperlipemia                                                 Cardiomyopathy ([place])                                           Comment:mild   COPD (chronic obstructive pulmonary disease) (*              Diabetes mellitus without complication (HCC)                 Chronic kidney disease                                      Past Surgical History:   TONSILLECTOMY                                                 ORIF ANKLE FRACTURE                             Right              KIDNEY STONE SURGERY                            Left  PACEMAKER INSERTION                                          Signs and symptoms suggestive of sleep  apnea    Reproductive/Obstetrics negative OB ROS                           Anesthesia Physical Anesthesia Plan  ASA: IV  Anesthesia Plan: General and MAC   Post-op Pain Management:    Induction:   Airway Management Planned:   Additional Equipment:   Intra-op Plan:   Post-operative Plan:   Informed Consent: I have reviewed the patients History and Physical, chart, labs and discussed the procedure including the risks, benefits and alternatives for the proposed anesthesia with the patient or authorized representative who has indicated his/her understanding and acceptance.   Dental Advisory Given  Plan Discussed with: Anesthesiologist, CRNA and Surgeon  Anesthesia Plan Comments:         Anesthesia Quick Evaluation

## 2015-03-01 NOTE — Anesthesia Postprocedure Evaluation (Signed)
Anesthesia Post Note  Patient: Johnathan Hudson  Procedure(s) Performed: Procedure(s) (LRB): PACEMAKER CHANGE OUT (N/A)  Patient location during evaluation: PACU Anesthesia Type: General Level of consciousness: awake and alert Pain management: pain level controlled Vital Signs Assessment: post-procedure vital signs reviewed and stable Respiratory status: spontaneous breathing, nonlabored ventilation, respiratory function stable and patient connected to nasal cannula oxygen Cardiovascular status: blood pressure returned to baseline and stable Postop Assessment: no signs of nausea or vomiting Anesthetic complications: no    Last Vitals:  Filed Vitals:   03/01/15 1327 03/01/15 1343  BP:  163/98  Pulse:  62  Temp: 36.7 C 36.5 C  Resp:  16    Last Pain:  Filed Vitals:   03/01/15 1344  PainSc: 0-No pain                 Precious Haws Dorianne Perret

## 2015-03-01 NOTE — Op Note (Signed)
[facility] Cardiology   03/01/2015                     1:01 PM  PATIENT:  [name]    PRE-OPERATIVE DIAGNOSIS:  sss  POST-OPERATIVE DIAGNOSIS:  Same  PROCEDURE:  PACEMAKER CHANGE OUT  SURGEON:  Zinnia Tindall, MD    ANESTHESIA:     PREOPERATIVE INDICATIONS:  TOMAZ GWYNN is a  81 y.o. male with a diagnosis of sss who failed conservative measures and elected for surgical management.    The risks benefits and alternatives were discussed with the patient preoperatively including but not limited to the risks of infection, bleeding, cardiopulmonary complications, the need for revision surgery, among others, and the patient was willing to proceed.   OPERATIVE PROCEDURE: The patient was brought to the operating room in a fasting state. The left pectoral region was prepped and draped in the usual sterile manner. Local anesthesia was obtained 1% lidocaine. A 6 cm incision was performed over the pacemaker pocket. Pacemaker generator was retrieved by electrocautery and blunt dissection. Leads were disconnected from the pacemaker generator and connected to a new dual chamber rate responsive pacemaker generator ( Medtronic [id number] ), and positioned into the pocket. The pocket was closed with 2-0 and 4-0 Vicryl, respectively. Steri-Strips and a pressure dressing were applied.

## 2015-03-01 NOTE — Anesthesia Procedure Notes (Signed)
Performed by: Demetrius Charity Pre-anesthesia Checklist: Patient identified, Emergency Drugs available, Suction available and Timeout performed Oxygen Delivery Method: Simple face mask Intubation Type: IV induction

## 2015-03-01 NOTE — Discharge Instructions (Signed)
General Anesthesia, Adult °General anesthesia is a sleep-like state of non-feeling produced by medicines (anesthetics). General anesthesia prevents you from being alert and feeling pain during a medical procedure. Your caregiver may recommend general anesthesia if your procedure: °· Is long. °· Is painful or uncomfortable. °· Would be frightening to see or hear. °· Requires you to be still. °· Affects your breathing. °· Causes significant blood loss. °LET YOUR CAREGIVER KNOW ABOUT: °· Allergies to food or medicine. °· Medicines taken, including vitamins, herbs, eyedrops, over-the-counter medicines, and creams. °· Use of steroids (by mouth or creams). °· Previous problems with anesthetics or numbing medicines, including problems experienced by relatives. °· History of bleeding problems or blood clots. °· Previous surgeries and types of anesthetics received. °· Possibility of pregnancy, if this applies. °· Use of cigarettes, alcohol, or illegal drugs. °· Any health condition(s), especially diabetes, sleep apnea, and high blood pressure. °RISKS AND COMPLICATIONS °General anesthesia rarely causes complications. However, if complications do occur, they can be life threatening. Complications include: °· A lung infection. °· A stroke. °· A heart attack. °· Waking up during the procedure. When this occurs, the patient may be unable to move and communicate that he or she is awake. The patient may feel severe pain. °Older adults and adults with serious medical problems are more likely to have complications than adults who are young and healthy. Some complications can be prevented by answering all of your caregiver's questions thoroughly and by following all pre-procedure instructions. It is important to tell your caregiver if any of the pre-procedure instructions, especially those related to diet, were not followed. Any food or liquid in the stomach can cause problems when you are under general anesthesia. °BEFORE THE  PROCEDURE °· Ask your caregiver if you will have to spend the night at the hospital. If you will not have to spend the night, arrange to have an adult drive you and stay with you for 24 hours. °· Follow your caregiver's instructions if you are taking dietary supplements or medicines. Your caregiver may tell you to stop taking them or to reduce your dosage. °· Do not smoke for as long as possible before your procedure. If possible, stop smoking 3-6 weeks before the procedure. °· Do not take new dietary supplements or medicines within 1 week of your procedure unless your caregiver approves them. °· Do not eat within 8 hours of your procedure or as directed by your caregiver. Drink only clear liquids, such as water, black coffee (without milk or cream), and fruit juices (without pulp). °· Do not drink within 3 hours of your procedure or as directed by your caregiver. °· You may brush your teeth on the morning of the procedure, but make sure to spit out the toothpaste and water when finished. °PROCEDURE  °You will receive anesthetics through a mask, through an intravenous (IV) access tube, or through both. A doctor who specializes in anesthesia (anesthesiologist) or a nurse who specializes in anesthesia (nurse anesthetist) or both will stay with you throughout the procedure to make sure you remain unconscious. He or she will also watch your blood pressure, pulse, and oxygen levels to make sure that the anesthetics do not cause any problems. Once you are asleep, a breathing tube or mask may be used to help you breathe. °AFTER THE PROCEDURE °You will wake up after the procedure is complete. You may be in the room where the procedure was performed or in a recovery area. You may have a sore throat   if a breathing tube was used. You may also feel: °· Dizzy. °· Weak. °· Drowsy. °· Confused. °· Nauseous. °· Cold. °These are all normal responses and can be expected to last for up to 24 hours after the procedure is complete. A  caregiver will tell you when you are ready to go home. This will usually be when you are fully awake and in stable condition. °  °This information is not intended to replace advice given to you by your health care provider. Make sure you discuss any questions you have with your health care provider. °  °Document Released: 06/19/2007 Document Revised: 04/02/2014 Document Reviewed: 07/11/2011 °Elsevier Interactive Patient Education ©2016 [employer]vier [employer] ° °

## 2015-03-08 DIAGNOSIS — E119 Type 2 diabetes mellitus without complications: Secondary | ICD-10-CM | POA: Diagnosis not present

## 2015-03-08 DIAGNOSIS — R079 Chest pain, unspecified: Secondary | ICD-10-CM | POA: Diagnosis not present

## 2015-03-08 DIAGNOSIS — R0602 Shortness of breath: Secondary | ICD-10-CM | POA: Diagnosis not present

## 2015-03-08 DIAGNOSIS — Z789 Other specified health status: Secondary | ICD-10-CM | POA: Diagnosis not present

## 2015-03-08 DIAGNOSIS — E782 Mixed hyperlipidemia: Secondary | ICD-10-CM | POA: Diagnosis not present

## 2015-03-08 DIAGNOSIS — I10 Essential (primary) hypertension: Secondary | ICD-10-CM | POA: Diagnosis not present

## 2015-03-08 DIAGNOSIS — Z950 Presence of cardiac pacemaker: Secondary | ICD-10-CM | POA: Diagnosis not present

## 2015-03-08 DIAGNOSIS — I480 Paroxysmal atrial fibrillation: Secondary | ICD-10-CM | POA: Diagnosis not present

## 2015-03-30 DIAGNOSIS — E782 Mixed hyperlipidemia: Secondary | ICD-10-CM | POA: Diagnosis not present

## 2015-03-30 DIAGNOSIS — I480 Paroxysmal atrial fibrillation: Secondary | ICD-10-CM | POA: Diagnosis not present

## 2015-03-30 DIAGNOSIS — Z45018 Encounter for adjustment and management of other part of cardiac pacemaker: Secondary | ICD-10-CM | POA: Diagnosis not present

## 2015-03-30 DIAGNOSIS — I10 Essential (primary) hypertension: Secondary | ICD-10-CM | POA: Diagnosis not present

## 2015-04-06 DIAGNOSIS — R251 Tremor, unspecified: Secondary | ICD-10-CM | POA: Diagnosis not present

## 2015-04-06 DIAGNOSIS — I480 Paroxysmal atrial fibrillation: Secondary | ICD-10-CM | POA: Diagnosis not present

## 2015-04-06 DIAGNOSIS — E119 Type 2 diabetes mellitus without complications: Secondary | ICD-10-CM | POA: Diagnosis not present

## 2015-04-06 DIAGNOSIS — M5441 Lumbago with sciatica, right side: Secondary | ICD-10-CM | POA: Diagnosis not present

## 2015-04-11 DIAGNOSIS — I480 Paroxysmal atrial fibrillation: Secondary | ICD-10-CM | POA: Diagnosis not present

## 2015-04-11 DIAGNOSIS — Z8739 Personal history of other diseases of the musculoskeletal system and connective tissue: Secondary | ICD-10-CM | POA: Diagnosis not present

## 2015-04-11 DIAGNOSIS — G252 Other specified forms of tremor: Secondary | ICD-10-CM | POA: Diagnosis not present

## 2015-04-11 DIAGNOSIS — M059 Rheumatoid arthritis with rheumatoid factor, unspecified: Secondary | ICD-10-CM | POA: Diagnosis not present

## 2015-04-11 DIAGNOSIS — E1121 Type 2 diabetes mellitus with diabetic nephropathy: Secondary | ICD-10-CM | POA: Diagnosis not present

## 2015-04-11 DIAGNOSIS — E119 Type 2 diabetes mellitus without complications: Secondary | ICD-10-CM | POA: Diagnosis not present

## 2015-04-11 DIAGNOSIS — Z950 Presence of cardiac pacemaker: Secondary | ICD-10-CM | POA: Diagnosis not present

## 2015-04-11 DIAGNOSIS — I10 Essential (primary) hypertension: Secondary | ICD-10-CM | POA: Diagnosis not present

## 2015-04-11 DIAGNOSIS — Z0000 Encounter for general adult medical examination without abnormal findings: Secondary | ICD-10-CM | POA: Diagnosis not present

## 2015-06-29 DIAGNOSIS — R0602 Shortness of breath: Secondary | ICD-10-CM | POA: Diagnosis not present

## 2015-06-29 DIAGNOSIS — E7800 Pure hypercholesterolemia, unspecified: Secondary | ICD-10-CM | POA: Diagnosis not present

## 2015-06-29 DIAGNOSIS — Z950 Presence of cardiac pacemaker: Secondary | ICD-10-CM | POA: Diagnosis not present

## 2015-06-29 DIAGNOSIS — Z789 Other specified health status: Secondary | ICD-10-CM | POA: Diagnosis not present

## 2015-06-29 DIAGNOSIS — I10 Essential (primary) hypertension: Secondary | ICD-10-CM | POA: Diagnosis not present

## 2015-06-29 DIAGNOSIS — I480 Paroxysmal atrial fibrillation: Secondary | ICD-10-CM | POA: Diagnosis not present

## 2015-06-29 DIAGNOSIS — T82837D Hemorrhage of cardiac prosthetic devices, implants and grafts, subsequent encounter: Secondary | ICD-10-CM | POA: Diagnosis not present

## 2015-07-22 DIAGNOSIS — I129 Hypertensive chronic kidney disease with stage 1 through stage 4 chronic kidney disease, or unspecified chronic kidney disease: Secondary | ICD-10-CM | POA: Diagnosis not present

## 2015-07-22 DIAGNOSIS — N183 Chronic kidney disease, stage 3 (moderate): Secondary | ICD-10-CM | POA: Diagnosis not present

## 2015-07-22 DIAGNOSIS — E1122 Type 2 diabetes mellitus with diabetic chronic kidney disease: Secondary | ICD-10-CM | POA: Diagnosis not present

## 2015-07-22 DIAGNOSIS — N2581 Secondary hyperparathyroidism of renal origin: Secondary | ICD-10-CM | POA: Diagnosis not present

## 2015-07-22 DIAGNOSIS — R809 Proteinuria, unspecified: Secondary | ICD-10-CM | POA: Diagnosis not present

## 2015-08-02 DIAGNOSIS — Z950 Presence of cardiac pacemaker: Secondary | ICD-10-CM | POA: Diagnosis not present

## 2015-08-02 DIAGNOSIS — I480 Paroxysmal atrial fibrillation: Secondary | ICD-10-CM | POA: Diagnosis not present

## 2015-08-02 DIAGNOSIS — Z0000 Encounter for general adult medical examination without abnormal findings: Secondary | ICD-10-CM | POA: Diagnosis not present

## 2015-08-02 DIAGNOSIS — G252 Other specified forms of tremor: Secondary | ICD-10-CM | POA: Diagnosis not present

## 2015-08-02 DIAGNOSIS — M059 Rheumatoid arthritis with rheumatoid factor, unspecified: Secondary | ICD-10-CM | POA: Diagnosis not present

## 2015-08-02 DIAGNOSIS — I10 Essential (primary) hypertension: Secondary | ICD-10-CM | POA: Diagnosis not present

## 2015-08-02 DIAGNOSIS — E119 Type 2 diabetes mellitus without complications: Secondary | ICD-10-CM | POA: Diagnosis not present

## 2015-08-11 DIAGNOSIS — Z950 Presence of cardiac pacemaker: Secondary | ICD-10-CM | POA: Diagnosis not present

## 2015-08-11 DIAGNOSIS — Z23 Encounter for immunization: Secondary | ICD-10-CM | POA: Diagnosis not present

## 2015-08-11 DIAGNOSIS — E119 Type 2 diabetes mellitus without complications: Secondary | ICD-10-CM | POA: Diagnosis not present

## 2015-08-11 DIAGNOSIS — R251 Tremor, unspecified: Secondary | ICD-10-CM | POA: Diagnosis not present

## 2015-08-11 DIAGNOSIS — I10 Essential (primary) hypertension: Secondary | ICD-10-CM | POA: Diagnosis not present

## 2015-08-16 DIAGNOSIS — H35433 Paving stone degeneration of retina, bilateral: Secondary | ICD-10-CM | POA: Diagnosis not present

## 2015-08-16 DIAGNOSIS — H43811 Vitreous degeneration, right eye: Secondary | ICD-10-CM | POA: Diagnosis not present

## 2015-08-16 DIAGNOSIS — H35371 Puckering of macula, right eye: Secondary | ICD-10-CM | POA: Diagnosis not present

## 2015-08-16 DIAGNOSIS — E113293 Type 2 diabetes mellitus with mild nonproliferative diabetic retinopathy without macular edema, bilateral: Secondary | ICD-10-CM | POA: Diagnosis not present

## 2015-12-08 DIAGNOSIS — N5201 Erectile dysfunction due to arterial insufficiency: Secondary | ICD-10-CM | POA: Diagnosis not present

## 2015-12-08 DIAGNOSIS — R351 Nocturia: Secondary | ICD-10-CM | POA: Diagnosis not present

## 2015-12-08 DIAGNOSIS — N401 Enlarged prostate with lower urinary tract symptoms: Secondary | ICD-10-CM | POA: Diagnosis not present

## 2015-12-22 DIAGNOSIS — Z950 Presence of cardiac pacemaker: Secondary | ICD-10-CM | POA: Diagnosis not present

## 2015-12-22 DIAGNOSIS — E119 Type 2 diabetes mellitus without complications: Secondary | ICD-10-CM | POA: Diagnosis not present

## 2015-12-22 DIAGNOSIS — I10 Essential (primary) hypertension: Secondary | ICD-10-CM | POA: Diagnosis not present

## 2015-12-22 DIAGNOSIS — R251 Tremor, unspecified: Secondary | ICD-10-CM | POA: Diagnosis not present

## 2015-12-22 DIAGNOSIS — Z23 Encounter for immunization: Secondary | ICD-10-CM | POA: Diagnosis not present

## 2015-12-27 DIAGNOSIS — I480 Paroxysmal atrial fibrillation: Secondary | ICD-10-CM | POA: Diagnosis not present

## 2015-12-27 DIAGNOSIS — E119 Type 2 diabetes mellitus without complications: Secondary | ICD-10-CM | POA: Diagnosis not present

## 2015-12-27 DIAGNOSIS — N183 Chronic kidney disease, stage 3 (moderate): Secondary | ICD-10-CM | POA: Diagnosis not present

## 2015-12-27 DIAGNOSIS — Z950 Presence of cardiac pacemaker: Secondary | ICD-10-CM | POA: Diagnosis not present

## 2015-12-27 DIAGNOSIS — Z23 Encounter for immunization: Secondary | ICD-10-CM | POA: Diagnosis not present

## 2015-12-27 DIAGNOSIS — R251 Tremor, unspecified: Secondary | ICD-10-CM | POA: Diagnosis not present

## 2015-12-27 DIAGNOSIS — I10 Essential (primary) hypertension: Secondary | ICD-10-CM | POA: Diagnosis not present

## 2015-12-27 DIAGNOSIS — Z0000 Encounter for general adult medical examination without abnormal findings: Secondary | ICD-10-CM | POA: Diagnosis not present

## 2016-01-02 DIAGNOSIS — I480 Paroxysmal atrial fibrillation: Secondary | ICD-10-CM | POA: Diagnosis not present

## 2016-01-02 DIAGNOSIS — I10 Essential (primary) hypertension: Secondary | ICD-10-CM | POA: Diagnosis not present

## 2016-01-02 DIAGNOSIS — E7800 Pure hypercholesterolemia, unspecified: Secondary | ICD-10-CM | POA: Diagnosis not present

## 2016-01-02 DIAGNOSIS — R0602 Shortness of breath: Secondary | ICD-10-CM | POA: Diagnosis not present

## 2016-01-02 DIAGNOSIS — Z789 Other specified health status: Secondary | ICD-10-CM | POA: Diagnosis not present

## 2016-01-02 DIAGNOSIS — Z950 Presence of cardiac pacemaker: Secondary | ICD-10-CM | POA: Diagnosis not present

## 2016-02-03 DIAGNOSIS — I129 Hypertensive chronic kidney disease with stage 1 through stage 4 chronic kidney disease, or unspecified chronic kidney disease: Secondary | ICD-10-CM | POA: Diagnosis not present

## 2016-02-03 DIAGNOSIS — R809 Proteinuria, unspecified: Secondary | ICD-10-CM | POA: Diagnosis not present

## 2016-02-03 DIAGNOSIS — N183 Chronic kidney disease, stage 3 (moderate): Secondary | ICD-10-CM | POA: Diagnosis not present

## 2016-02-03 DIAGNOSIS — E1122 Type 2 diabetes mellitus with diabetic chronic kidney disease: Secondary | ICD-10-CM | POA: Diagnosis not present

## 2016-02-22 DIAGNOSIS — H35371 Puckering of macula, right eye: Secondary | ICD-10-CM | POA: Diagnosis not present

## 2016-02-22 DIAGNOSIS — H2511 Age-related nuclear cataract, right eye: Secondary | ICD-10-CM | POA: Diagnosis not present

## 2016-02-22 DIAGNOSIS — H35342 Macular cyst, hole, or pseudohole, left eye: Secondary | ICD-10-CM | POA: Diagnosis not present

## 2016-02-22 DIAGNOSIS — E119 Type 2 diabetes mellitus without complications: Secondary | ICD-10-CM | POA: Diagnosis not present

## 2016-04-05 DIAGNOSIS — Z0000 Encounter for general adult medical examination without abnormal findings: Secondary | ICD-10-CM | POA: Diagnosis not present

## 2016-04-05 DIAGNOSIS — Z950 Presence of cardiac pacemaker: Secondary | ICD-10-CM | POA: Diagnosis not present

## 2016-04-05 DIAGNOSIS — I10 Essential (primary) hypertension: Secondary | ICD-10-CM | POA: Diagnosis not present

## 2016-04-05 DIAGNOSIS — I480 Paroxysmal atrial fibrillation: Secondary | ICD-10-CM | POA: Diagnosis not present

## 2016-04-05 DIAGNOSIS — R251 Tremor, unspecified: Secondary | ICD-10-CM | POA: Diagnosis not present

## 2016-04-05 DIAGNOSIS — N183 Chronic kidney disease, stage 3 (moderate): Secondary | ICD-10-CM | POA: Diagnosis not present

## 2016-04-05 DIAGNOSIS — E119 Type 2 diabetes mellitus without complications: Secondary | ICD-10-CM | POA: Diagnosis not present

## 2016-04-25 DIAGNOSIS — D649 Anemia, unspecified: Secondary | ICD-10-CM | POA: Diagnosis not present

## 2016-05-07 DIAGNOSIS — N183 Chronic kidney disease, stage 3 (moderate): Secondary | ICD-10-CM | POA: Diagnosis not present

## 2016-05-18 DIAGNOSIS — E1122 Type 2 diabetes mellitus with diabetic chronic kidney disease: Secondary | ICD-10-CM | POA: Diagnosis not present

## 2016-05-18 DIAGNOSIS — Z950 Presence of cardiac pacemaker: Secondary | ICD-10-CM | POA: Diagnosis not present

## 2016-05-18 DIAGNOSIS — I480 Paroxysmal atrial fibrillation: Secondary | ICD-10-CM | POA: Diagnosis not present

## 2016-05-18 DIAGNOSIS — G252 Other specified forms of tremor: Secondary | ICD-10-CM | POA: Diagnosis not present

## 2016-05-18 DIAGNOSIS — N183 Chronic kidney disease, stage 3 (moderate): Secondary | ICD-10-CM | POA: Diagnosis not present

## 2016-05-18 DIAGNOSIS — I10 Essential (primary) hypertension: Secondary | ICD-10-CM | POA: Diagnosis not present

## 2016-05-18 DIAGNOSIS — Z0000 Encounter for general adult medical examination without abnormal findings: Secondary | ICD-10-CM | POA: Diagnosis not present

## 2016-07-06 DIAGNOSIS — I10 Essential (primary) hypertension: Secondary | ICD-10-CM | POA: Diagnosis not present

## 2016-07-06 DIAGNOSIS — E119 Type 2 diabetes mellitus without complications: Secondary | ICD-10-CM | POA: Diagnosis not present

## 2016-07-06 DIAGNOSIS — N183 Chronic kidney disease, stage 3 (moderate): Secondary | ICD-10-CM | POA: Diagnosis not present

## 2016-07-06 DIAGNOSIS — Z950 Presence of cardiac pacemaker: Secondary | ICD-10-CM | POA: Diagnosis not present

## 2016-07-06 DIAGNOSIS — T82837D Hemorrhage of cardiac prosthetic devices, implants and grafts, subsequent encounter: Secondary | ICD-10-CM | POA: Diagnosis not present

## 2016-07-06 DIAGNOSIS — E7800 Pure hypercholesterolemia, unspecified: Secondary | ICD-10-CM | POA: Diagnosis not present

## 2016-07-06 DIAGNOSIS — Z789 Other specified health status: Secondary | ICD-10-CM | POA: Diagnosis not present

## 2016-07-06 DIAGNOSIS — R0602 Shortness of breath: Secondary | ICD-10-CM | POA: Diagnosis not present

## 2016-07-06 DIAGNOSIS — I480 Paroxysmal atrial fibrillation: Secondary | ICD-10-CM | POA: Diagnosis not present

## 2016-08-01 ENCOUNTER — Other Ambulatory Visit: Payer: Self-pay | Admitting: Internal Medicine

## 2016-08-01 DIAGNOSIS — M5441 Lumbago with sciatica, right side: Secondary | ICD-10-CM

## 2016-08-01 DIAGNOSIS — E785 Hyperlipidemia, unspecified: Secondary | ICD-10-CM | POA: Diagnosis not present

## 2016-08-01 DIAGNOSIS — E119 Type 2 diabetes mellitus without complications: Secondary | ICD-10-CM

## 2016-08-01 DIAGNOSIS — M5442 Lumbago with sciatica, left side: Secondary | ICD-10-CM | POA: Diagnosis not present

## 2016-08-01 DIAGNOSIS — N183 Chronic kidney disease, stage 3 (moderate): Secondary | ICD-10-CM | POA: Diagnosis not present

## 2016-08-01 DIAGNOSIS — I10 Essential (primary) hypertension: Secondary | ICD-10-CM | POA: Diagnosis not present

## 2016-08-01 DIAGNOSIS — E1122 Type 2 diabetes mellitus with diabetic chronic kidney disease: Secondary | ICD-10-CM | POA: Diagnosis not present

## 2016-08-01 DIAGNOSIS — I129 Hypertensive chronic kidney disease with stage 1 through stage 4 chronic kidney disease, or unspecified chronic kidney disease: Secondary | ICD-10-CM | POA: Diagnosis not present

## 2016-08-01 DIAGNOSIS — I480 Paroxysmal atrial fibrillation: Secondary | ICD-10-CM | POA: Diagnosis not present

## 2016-08-01 DIAGNOSIS — R809 Proteinuria, unspecified: Secondary | ICD-10-CM | POA: Diagnosis not present

## 2016-08-01 DIAGNOSIS — E875 Hyperkalemia: Secondary | ICD-10-CM | POA: Diagnosis not present

## 2016-08-06 DIAGNOSIS — R3914 Feeling of incomplete bladder emptying: Secondary | ICD-10-CM | POA: Diagnosis not present

## 2016-08-06 DIAGNOSIS — R350 Frequency of micturition: Secondary | ICD-10-CM | POA: Diagnosis not present

## 2016-08-06 DIAGNOSIS — N390 Urinary tract infection, site not specified: Secondary | ICD-10-CM | POA: Diagnosis not present

## 2016-08-06 DIAGNOSIS — R102 Pelvic and perineal pain: Secondary | ICD-10-CM | POA: Diagnosis not present

## 2016-08-08 ENCOUNTER — Ambulatory Visit
Admission: RE | Admit: 2016-08-08 | Discharge: 2016-08-08 | Disposition: A | Discharge status: Home / Self Care | Payer: Medicare HMO | Source: Ambulatory Visit | Attending: Internal Medicine | Admitting: Internal Medicine

## 2016-08-08 DIAGNOSIS — M5137 Other intervertebral disc degeneration, lumbosacral region: Secondary | ICD-10-CM | POA: Diagnosis not present

## 2016-08-08 DIAGNOSIS — M5441 Lumbago with sciatica, right side: Secondary | ICD-10-CM | POA: Diagnosis not present

## 2016-08-08 DIAGNOSIS — E119 Type 2 diabetes mellitus without complications: Secondary | ICD-10-CM

## 2016-08-08 DIAGNOSIS — M5442 Lumbago with sciatica, left side: Secondary | ICD-10-CM | POA: Insufficient documentation

## 2016-08-08 DIAGNOSIS — M5136 Other intervertebral disc degeneration, lumbar region: Secondary | ICD-10-CM | POA: Insufficient documentation

## 2016-08-08 DIAGNOSIS — M48061 Spinal stenosis, lumbar region without neurogenic claudication: Secondary | ICD-10-CM | POA: Insufficient documentation

## 2016-08-21 DIAGNOSIS — N401 Enlarged prostate with lower urinary tract symptoms: Secondary | ICD-10-CM | POA: Diagnosis not present

## 2016-08-21 DIAGNOSIS — R351 Nocturia: Secondary | ICD-10-CM | POA: Diagnosis not present

## 2016-08-21 DIAGNOSIS — R102 Pelvic and perineal pain: Secondary | ICD-10-CM | POA: Diagnosis not present

## 2016-08-23 DIAGNOSIS — N401 Enlarged prostate with lower urinary tract symptoms: Secondary | ICD-10-CM | POA: Diagnosis not present

## 2016-08-23 DIAGNOSIS — R3914 Feeling of incomplete bladder emptying: Secondary | ICD-10-CM | POA: Diagnosis not present

## 2016-08-23 DIAGNOSIS — Z125 Encounter for screening for malignant neoplasm of prostate: Secondary | ICD-10-CM | POA: Diagnosis not present

## 2016-08-27 DIAGNOSIS — N401 Enlarged prostate with lower urinary tract symptoms: Secondary | ICD-10-CM | POA: Diagnosis not present

## 2016-08-27 DIAGNOSIS — Z125 Encounter for screening for malignant neoplasm of prostate: Secondary | ICD-10-CM | POA: Diagnosis not present

## 2016-08-27 DIAGNOSIS — R3914 Feeling of incomplete bladder emptying: Secondary | ICD-10-CM | POA: Diagnosis not present

## 2016-09-04 DIAGNOSIS — M4317 Spondylolisthesis, lumbosacral region: Secondary | ICD-10-CM | POA: Diagnosis not present

## 2016-09-10 DIAGNOSIS — M4317 Spondylolisthesis, lumbosacral region: Secondary | ICD-10-CM | POA: Diagnosis not present

## 2016-09-12 DIAGNOSIS — E1122 Type 2 diabetes mellitus with diabetic chronic kidney disease: Secondary | ICD-10-CM | POA: Diagnosis not present

## 2016-09-12 DIAGNOSIS — N183 Chronic kidney disease, stage 3 (moderate): Secondary | ICD-10-CM | POA: Diagnosis not present

## 2016-09-12 DIAGNOSIS — Z950 Presence of cardiac pacemaker: Secondary | ICD-10-CM | POA: Diagnosis not present

## 2016-09-12 DIAGNOSIS — M4317 Spondylolisthesis, lumbosacral region: Secondary | ICD-10-CM | POA: Diagnosis not present

## 2016-09-12 DIAGNOSIS — I480 Paroxysmal atrial fibrillation: Secondary | ICD-10-CM | POA: Diagnosis not present

## 2016-09-12 DIAGNOSIS — I10 Essential (primary) hypertension: Secondary | ICD-10-CM | POA: Diagnosis not present

## 2016-09-12 DIAGNOSIS — G252 Other specified forms of tremor: Secondary | ICD-10-CM | POA: Diagnosis not present

## 2016-09-12 DIAGNOSIS — Z0000 Encounter for general adult medical examination without abnormal findings: Secondary | ICD-10-CM | POA: Diagnosis not present

## 2016-09-17 DIAGNOSIS — M4317 Spondylolisthesis, lumbosacral region: Secondary | ICD-10-CM | POA: Diagnosis not present

## 2016-09-18 DIAGNOSIS — I480 Paroxysmal atrial fibrillation: Secondary | ICD-10-CM | POA: Diagnosis not present

## 2016-09-18 DIAGNOSIS — M48061 Spinal stenosis, lumbar region without neurogenic claudication: Secondary | ICD-10-CM | POA: Diagnosis not present

## 2016-09-18 DIAGNOSIS — Z23 Encounter for immunization: Secondary | ICD-10-CM | POA: Diagnosis not present

## 2016-09-18 DIAGNOSIS — D649 Anemia, unspecified: Secondary | ICD-10-CM | POA: Diagnosis not present

## 2016-09-18 DIAGNOSIS — Z950 Presence of cardiac pacemaker: Secondary | ICD-10-CM | POA: Diagnosis not present

## 2016-09-18 DIAGNOSIS — E875 Hyperkalemia: Secondary | ICD-10-CM | POA: Diagnosis not present

## 2016-09-18 DIAGNOSIS — E119 Type 2 diabetes mellitus without complications: Secondary | ICD-10-CM | POA: Diagnosis not present

## 2016-09-18 DIAGNOSIS — N200 Calculus of kidney: Secondary | ICD-10-CM | POA: Diagnosis not present

## 2016-09-18 DIAGNOSIS — I10 Essential (primary) hypertension: Secondary | ICD-10-CM | POA: Diagnosis not present

## 2016-09-19 DIAGNOSIS — M4317 Spondylolisthesis, lumbosacral region: Secondary | ICD-10-CM | POA: Diagnosis not present

## 2016-09-24 DIAGNOSIS — M4317 Spondylolisthesis, lumbosacral region: Secondary | ICD-10-CM | POA: Diagnosis not present

## 2016-09-27 DIAGNOSIS — M4317 Spondylolisthesis, lumbosacral region: Secondary | ICD-10-CM | POA: Diagnosis not present

## 2016-10-01 DIAGNOSIS — M4317 Spondylolisthesis, lumbosacral region: Secondary | ICD-10-CM | POA: Diagnosis not present

## 2016-10-03 DIAGNOSIS — M4317 Spondylolisthesis, lumbosacral region: Secondary | ICD-10-CM | POA: Diagnosis not present

## 2016-10-08 DIAGNOSIS — M48062 Spinal stenosis, lumbar region with neurogenic claudication: Secondary | ICD-10-CM | POA: Diagnosis not present

## 2016-10-08 DIAGNOSIS — M5416 Radiculopathy, lumbar region: Secondary | ICD-10-CM | POA: Diagnosis not present

## 2016-10-08 DIAGNOSIS — M5136 Other intervertebral disc degeneration, lumbar region: Secondary | ICD-10-CM | POA: Diagnosis not present

## 2016-10-08 DIAGNOSIS — M47816 Spondylosis without myelopathy or radiculopathy, lumbar region: Secondary | ICD-10-CM | POA: Diagnosis not present

## 2016-10-10 DIAGNOSIS — N390 Urinary tract infection, site not specified: Secondary | ICD-10-CM | POA: Diagnosis not present

## 2016-10-10 DIAGNOSIS — N401 Enlarged prostate with lower urinary tract symptoms: Secondary | ICD-10-CM | POA: Diagnosis not present

## 2016-10-10 DIAGNOSIS — R300 Dysuria: Secondary | ICD-10-CM | POA: Diagnosis not present

## 2016-10-10 DIAGNOSIS — R350 Frequency of micturition: Secondary | ICD-10-CM | POA: Diagnosis not present

## 2016-10-18 DIAGNOSIS — H2511 Age-related nuclear cataract, right eye: Secondary | ICD-10-CM | POA: Diagnosis not present

## 2016-10-18 DIAGNOSIS — H35342 Macular cyst, hole, or pseudohole, left eye: Secondary | ICD-10-CM | POA: Diagnosis not present

## 2016-10-18 DIAGNOSIS — H35371 Puckering of macula, right eye: Secondary | ICD-10-CM | POA: Diagnosis not present

## 2016-10-18 DIAGNOSIS — E119 Type 2 diabetes mellitus without complications: Secondary | ICD-10-CM | POA: Diagnosis not present

## 2016-10-24 DIAGNOSIS — M5416 Radiculopathy, lumbar region: Secondary | ICD-10-CM | POA: Diagnosis not present

## 2016-10-24 DIAGNOSIS — M48062 Spinal stenosis, lumbar region with neurogenic claudication: Secondary | ICD-10-CM | POA: Diagnosis not present

## 2016-10-24 DIAGNOSIS — M5136 Other intervertebral disc degeneration, lumbar region: Secondary | ICD-10-CM | POA: Diagnosis not present

## 2016-11-16 DIAGNOSIS — H3581 Retinal edema: Secondary | ICD-10-CM | POA: Diagnosis not present

## 2016-11-16 DIAGNOSIS — E113293 Type 2 diabetes mellitus with mild nonproliferative diabetic retinopathy without macular edema, bilateral: Secondary | ICD-10-CM | POA: Diagnosis not present

## 2016-11-16 DIAGNOSIS — H35371 Puckering of macula, right eye: Secondary | ICD-10-CM | POA: Diagnosis not present

## 2016-11-16 DIAGNOSIS — H35341 Macular cyst, hole, or pseudohole, right eye: Secondary | ICD-10-CM | POA: Diagnosis not present

## 2016-11-21 DIAGNOSIS — M5136 Other intervertebral disc degeneration, lumbar region: Secondary | ICD-10-CM | POA: Diagnosis not present

## 2016-11-21 DIAGNOSIS — M48062 Spinal stenosis, lumbar region with neurogenic claudication: Secondary | ICD-10-CM | POA: Diagnosis not present

## 2016-11-21 DIAGNOSIS — M5416 Radiculopathy, lumbar region: Secondary | ICD-10-CM | POA: Diagnosis not present

## 2016-12-10 ENCOUNTER — Other Ambulatory Visit: Payer: Self-pay | Admitting: Urology

## 2016-12-10 DIAGNOSIS — R351 Nocturia: Secondary | ICD-10-CM | POA: Diagnosis not present

## 2016-12-10 DIAGNOSIS — N390 Urinary tract infection, site not specified: Secondary | ICD-10-CM | POA: Diagnosis not present

## 2016-12-10 DIAGNOSIS — R3915 Urgency of urination: Secondary | ICD-10-CM | POA: Diagnosis not present

## 2016-12-12 ENCOUNTER — Ambulatory Visit
Admission: RE | Admit: 2016-12-12 | Discharge: 2016-12-12 | Disposition: A | Discharge status: Home / Self Care | Payer: Medicare HMO | Source: Ambulatory Visit | Attending: Urology | Admitting: Urology

## 2016-12-12 DIAGNOSIS — N261 Atrophy of kidney (terminal): Secondary | ICD-10-CM | POA: Diagnosis not present

## 2016-12-12 DIAGNOSIS — N390 Urinary tract infection, site not specified: Secondary | ICD-10-CM | POA: Diagnosis not present

## 2016-12-12 DIAGNOSIS — N134 Hydroureter: Secondary | ICD-10-CM | POA: Insufficient documentation

## 2016-12-12 DIAGNOSIS — N1330 Unspecified hydronephrosis: Secondary | ICD-10-CM | POA: Insufficient documentation

## 2016-12-14 DIAGNOSIS — R3914 Feeling of incomplete bladder emptying: Secondary | ICD-10-CM | POA: Diagnosis not present

## 2016-12-14 DIAGNOSIS — N390 Urinary tract infection, site not specified: Secondary | ICD-10-CM | POA: Diagnosis not present

## 2016-12-14 DIAGNOSIS — R300 Dysuria: Secondary | ICD-10-CM | POA: Diagnosis not present

## 2016-12-24 DIAGNOSIS — M47816 Spondylosis without myelopathy or radiculopathy, lumbar region: Secondary | ICD-10-CM | POA: Diagnosis not present

## 2016-12-24 DIAGNOSIS — M5136 Other intervertebral disc degeneration, lumbar region: Secondary | ICD-10-CM | POA: Diagnosis not present

## 2016-12-24 DIAGNOSIS — M5416 Radiculopathy, lumbar region: Secondary | ICD-10-CM | POA: Diagnosis not present

## 2016-12-24 DIAGNOSIS — M48062 Spinal stenosis, lumbar region with neurogenic claudication: Secondary | ICD-10-CM | POA: Diagnosis not present

## 2016-12-25 DIAGNOSIS — E7800 Pure hypercholesterolemia, unspecified: Secondary | ICD-10-CM | POA: Diagnosis not present

## 2016-12-25 DIAGNOSIS — R0602 Shortness of breath: Secondary | ICD-10-CM | POA: Diagnosis not present

## 2016-12-25 DIAGNOSIS — E119 Type 2 diabetes mellitus without complications: Secondary | ICD-10-CM | POA: Diagnosis not present

## 2016-12-25 DIAGNOSIS — N183 Chronic kidney disease, stage 3 (moderate): Secondary | ICD-10-CM | POA: Diagnosis not present

## 2016-12-25 DIAGNOSIS — Z789 Other specified health status: Secondary | ICD-10-CM | POA: Diagnosis not present

## 2016-12-25 DIAGNOSIS — I480 Paroxysmal atrial fibrillation: Secondary | ICD-10-CM | POA: Diagnosis not present

## 2016-12-25 DIAGNOSIS — T82837D Hemorrhage of cardiac prosthetic devices, implants and grafts, subsequent encounter: Secondary | ICD-10-CM | POA: Diagnosis not present

## 2016-12-25 DIAGNOSIS — I10 Essential (primary) hypertension: Secondary | ICD-10-CM | POA: Diagnosis not present

## 2016-12-25 DIAGNOSIS — Z950 Presence of cardiac pacemaker: Secondary | ICD-10-CM | POA: Diagnosis not present

## 2017-01-01 DIAGNOSIS — I495 Sick sinus syndrome: Secondary | ICD-10-CM | POA: Diagnosis not present

## 2017-01-10 DIAGNOSIS — I1311 Hypertensive heart and chronic kidney disease without heart failure, with stage 5 chronic kidney disease, or end stage renal disease: Secondary | ICD-10-CM | POA: Diagnosis not present

## 2017-01-10 DIAGNOSIS — N1339 Other hydronephrosis: Secondary | ICD-10-CM | POA: Diagnosis not present

## 2017-01-11 DIAGNOSIS — I480 Paroxysmal atrial fibrillation: Secondary | ICD-10-CM | POA: Diagnosis not present

## 2017-01-11 DIAGNOSIS — N200 Calculus of kidney: Secondary | ICD-10-CM | POA: Diagnosis not present

## 2017-01-11 DIAGNOSIS — Z950 Presence of cardiac pacemaker: Secondary | ICD-10-CM | POA: Diagnosis not present

## 2017-01-11 DIAGNOSIS — I10 Essential (primary) hypertension: Secondary | ICD-10-CM | POA: Diagnosis not present

## 2017-01-11 DIAGNOSIS — E119 Type 2 diabetes mellitus without complications: Secondary | ICD-10-CM | POA: Diagnosis not present

## 2017-01-11 DIAGNOSIS — D649 Anemia, unspecified: Secondary | ICD-10-CM | POA: Diagnosis not present

## 2017-01-11 DIAGNOSIS — M48061 Spinal stenosis, lumbar region without neurogenic claudication: Secondary | ICD-10-CM | POA: Diagnosis not present

## 2017-01-15 DIAGNOSIS — N1339 Other hydronephrosis: Secondary | ICD-10-CM | POA: Diagnosis not present

## 2017-01-17 DIAGNOSIS — E875 Hyperkalemia: Secondary | ICD-10-CM | POA: Diagnosis not present

## 2017-01-18 DIAGNOSIS — E7800 Pure hypercholesterolemia, unspecified: Secondary | ICD-10-CM | POA: Diagnosis not present

## 2017-01-18 DIAGNOSIS — N183 Chronic kidney disease, stage 3 (moderate): Secondary | ICD-10-CM | POA: Diagnosis not present

## 2017-01-18 DIAGNOSIS — I10 Essential (primary) hypertension: Secondary | ICD-10-CM | POA: Diagnosis not present

## 2017-01-18 DIAGNOSIS — I480 Paroxysmal atrial fibrillation: Secondary | ICD-10-CM | POA: Diagnosis not present

## 2017-01-18 DIAGNOSIS — G252 Other specified forms of tremor: Secondary | ICD-10-CM | POA: Diagnosis not present

## 2017-01-18 DIAGNOSIS — E119 Type 2 diabetes mellitus without complications: Secondary | ICD-10-CM | POA: Diagnosis not present

## 2017-01-18 DIAGNOSIS — M48062 Spinal stenosis, lumbar region with neurogenic claudication: Secondary | ICD-10-CM | POA: Diagnosis not present

## 2017-01-22 DIAGNOSIS — B356 Tinea cruris: Secondary | ICD-10-CM | POA: Diagnosis not present

## 2017-01-22 DIAGNOSIS — N1339 Other hydronephrosis: Secondary | ICD-10-CM | POA: Diagnosis not present

## 2017-01-22 DIAGNOSIS — N35911 Unspecified urethral stricture, male, meatal: Secondary | ICD-10-CM | POA: Diagnosis not present

## 2017-01-22 DIAGNOSIS — R3914 Feeling of incomplete bladder emptying: Secondary | ICD-10-CM | POA: Diagnosis not present

## 2017-01-30 NOTE — H&P (Signed)
NAME:  Johnathan Hudson, Johnathan Hudson                    ACCOUNT NO.:  MEDICAL RECORD NO.:  [id number]  LOCATION:                                 FACILITY:  PHYSICIAN:  Maryan Puls          DATE OF BIRTH:  November 20, 1933  DATE OF ADMISSION: DATE OF DISCHARGE:                            HISTORY AND PHYSICAL   SAME-DAY SURGERY:  February 05, 2017.  CHIEF COMPLAINT:  Recurrent urinary tract infections.  HISTORY OF PRESENT ILLNESS:  Mr. Carpenter is an 83 year old white male with recurrent urinary tract infections, who was evaluated with renal ultrasound.  Renal ultrasound indicated left hydronephrosis of undetermined etiology.  He had a CT scan done in 2015, which also revealed an atrophic left kidney.  The patient was noted to have a creatinine of 1.70 mg/dL on December 10, 2016, and could not proceed with a contrast CT scan.  He comes in now for cystoscopy and left retrograde.  ALLERGIES: 1. THE PATIENT WAS ALLERGIC TO SULFA. 2. THE PATIENT WAS ALSO ALLERGIC TO LIPITOR.  CURRENT MEDICATIONS:  Tylenol, Xanax, Eliquis, gabapentin, Glucotrol, Ativan, omeprazole, Accupril, Zoloft, Zocor.  SURGICAL HISTORY: 1. Cystoscopy in 1993. 2. Inflatable penile prosthesis in 1995. 3. Cystoscopy with visual internal urethrotomy in 2004. 4. Photovaporization of prostate with GreenLight laser in 2004. 5. History of pacemaker placement. 6. History of repair of a right ankle fracture.  PAST AND CURRENT MEDICAL CONDITIONS: 1. Type 2 diabetes. 2. Rheumatoid arthritis. 3. Hyperlipidemia. 4. Hypertension. 5. Sinoatrial cardiac dysfunction, status post pacemaker. 6. Osteoarthritis. 7. Benign tremor. 8. Psoriasis. 9. COPD. 10.Generalized anxiety disorder. 11.GERD. 12.Renal insufficiency. 13.History of kidney stones. 14.Lumbar stenosis with neurogenic claudication. 15.Episodic atrial fibrillation.  REVIEW OF SYSTEMS:  The patient denied chest pain or short shortness of breath.  SOCIAL HISTORY:  The  patient denied tobacco or alcohol use.  FAMILY HISTORY:  Negative for urological disease.  PHYSICAL EXAMINATION:  GENERAL:  Elderly white male in no acute distress. HEENT:  Sclerae were clear. NECK:  Supple.  No palpable cervical adenopathy. LUNGS:  Clear to auscultation. CARDIOVASCULAR:  Regular rhythm and rate. ABDOMINAL:  Soft, nontender abdomen. GU:  Circumcised.  Penile prosthesis was present and cycled easily. Testes were smooth, nontender, 18 mL size each. RECTAL:  A 30 g, smooth, nontender prostate. NEUROMUSCULAR:  Alert and oriented x3.  IMPRESSION: 1. Left hydronephrosis of undetermined etiology. 2. Left renal atrophy. 3. Renal insufficiency. 4. History of urethral stricture disease.  PLAN:  Cystoscopy with left retrograde.          ______________________________ Maryan Puls     MW/MEDQ  D:  01/29/2017  T:  01/30/2017  Job:  [id number]

## 2017-02-01 ENCOUNTER — Encounter
Admission: RE | Admit: 2017-02-01 | Discharge: 2017-02-01 | Disposition: A | Discharge status: Home / Self Care | Payer: Medicare HMO | Source: Ambulatory Visit | Attending: Urology | Admitting: Urology

## 2017-02-01 ENCOUNTER — Other Ambulatory Visit: Payer: Self-pay

## 2017-02-01 DIAGNOSIS — Z950 Presence of cardiac pacemaker: Secondary | ICD-10-CM | POA: Insufficient documentation

## 2017-02-01 DIAGNOSIS — E119 Type 2 diabetes mellitus without complications: Secondary | ICD-10-CM | POA: Insufficient documentation

## 2017-02-01 DIAGNOSIS — Z01812 Encounter for preprocedural laboratory examination: Secondary | ICD-10-CM | POA: Diagnosis not present

## 2017-02-01 DIAGNOSIS — Z01810 Encounter for preprocedural cardiovascular examination: Secondary | ICD-10-CM | POA: Insufficient documentation

## 2017-02-01 HISTORY — DX: Disorder of kidney and ureter, unspecified: N28.9

## 2017-02-01 HISTORY — DX: Unspecified osteoarthritis, unspecified site: M19.90

## 2017-02-01 HISTORY — DX: Essential tremor: G25.0

## 2017-02-01 HISTORY — DX: Spinal stenosis, lumbar region with neurogenic claudication: M48.062

## 2017-02-01 HISTORY — DX: Psoriasis, unspecified: L40.9

## 2017-02-01 HISTORY — DX: Paroxysmal atrial fibrillation: I48.0

## 2017-02-01 HISTORY — DX: Anxiety disorder, unspecified: F41.9

## 2017-02-01 LAB — BASIC METABOLIC PANEL
ANION GAP: 10 (ref 5–15)
BUN: 22 mg/dL — ABNORMAL HIGH (ref 6–20)
CHLORIDE: 108 mmol/L (ref 101–111)
CO2: 22 mmol/L (ref 22–32)
Calcium: 9.2 mg/dL (ref 8.9–10.3)
Creatinine, Ser: 1.67 mg/dL — ABNORMAL HIGH (ref 0.61–1.24)
GFR calc non Af Amer: 37 mL/min — ABNORMAL LOW (ref >60)
GFR, EST AFRICAN AMERICAN: 43 mL/min — AB (ref >60)
GLUCOSE: 66 mg/dL (ref 65–99)
Potassium: 4.1 mmol/L (ref 3.5–5.1)
Sodium: 140 mmol/L (ref 135–145)

## 2017-02-01 LAB — CBC
HEMATOCRIT: 40 % (ref 40.0–52.0)
HEMOGLOBIN: 12.8 g/dL — AB (ref 13.0–18.0)
MCH: 29.7 pg (ref 26.0–34.0)
MCHC: 32 g/dL (ref 32.0–36.0)
MCV: 92.8 fL (ref 80.0–100.0)
Platelets: 193 10*3/uL (ref 150–440)
RBC: 4.31 MIL/uL — AB (ref 4.40–5.90)
RDW: 14.9 % — ABNORMAL HIGH (ref 11.5–14.5)
WBC: 5.2 10*3/uL (ref 3.8–10.6)

## 2017-02-01 LAB — SURGICAL PCR SCREEN
MRSA, PCR: NEGATIVE
Staphylococcus aureus: POSITIVE — AB

## 2017-02-01 NOTE — Patient Instructions (Signed)
Your procedure is scheduled on: Tuesday, February 05, 2017 Report to Same Day Surgery on the 2nd floor in the [place]. To find out your arrival time, please call [phone] between 1PM - 3PM on: Monday, February 04, 2017  REMEMBER: Instructions that are not followed completely may result in serious medical risk up to and including death; or upon the discretion of your surgeon and anesthesiologist your surgery may need to be rescheduled.  Do not eat food or drink liquids after midnight. No gum chewing or hard candies.  You may however, drink water only up to 2 hours before you are scheduled to arrive at the hospital for your procedure.  Do not drink water within 2 hours of your scheduled arrival to the hospital as this may lead to your procedure being delayed or rescheduled.  No Alcohol for 24 hours before or after surgery.  No Smoking or other tobacco products for 24 hours prior to surgery.  Notify your doctor if there is any change in your medical condition (cold, fever, infection).  Do not wear jewelry, make-up, hairpins, clips or nail polish.  Do not wear lotions, powders, or perfumes.   Do not shave 48 hours prior to surgery. Men may shave face and neck.  Contacts and dentures may not be worn into surgery.  Do not bring valuables to the hospital. [facility] is not responsible for any belongings or valuables.   TAKE THESE MEDICATIONS THE MORNING OF SURGERY WITH A SIP OF WATER:  1.  GABAPENTIN 2.  OMEPRAZOLE (take one capsule the night before surgery and one capsule the morning of surgery) - helps to prevent nausea after surgery. 3.  SERTRALINE  Follow recommendations from Cardiologist, Pulmonologist or PCP regarding stopping Eliquis. STOP NOW  NOW!  Stop Anti-inflammatories such as Advil, Aleve, Ibuprofen, Motrin, Naproxen, Naprosyn, Goodie powder, or aspirin products. (May take Tylenol or Acetaminophen if needed.)  NOW!  Stop OVER THE COUNTER supplements until  after surgery.   If you are being discharged the day of surgery, you will not be allowed to drive home. You will need someone to drive you home and stay with you that night.   If you are taking public transportation, you will need to have a responsible adult to with you.  Please call the number above if you have any questions about these instructions.

## 2017-02-04 NOTE — Pre-Procedure Instructions (Signed)
Dr. Saralyn Pilar' office notified to return the pacemaker form for tomorrow's surgery.

## 2017-02-04 NOTE — Pre-Procedure Instructions (Signed)
Faxed labs to Dr. Letta Kocher office which indicate staff positive.

## 2017-02-05 ENCOUNTER — Ambulatory Visit
Admission: RE | Admit: 2017-02-05 | Discharge: 2017-02-05 | Disposition: A | Discharge status: Home / Self Care | Payer: Medicare HMO | Source: Ambulatory Visit | Attending: Urology | Admitting: Urology

## 2017-02-05 ENCOUNTER — Encounter
Admission: RE | Disposition: A | Discharge status: Home / Self Care | Payer: Self-pay | Source: Ambulatory Visit | Attending: Urology

## 2017-02-05 ENCOUNTER — Other Ambulatory Visit: Payer: Self-pay

## 2017-02-05 ENCOUNTER — Encounter: Payer: Self-pay | Admitting: *Deleted

## 2017-02-05 ENCOUNTER — Ambulatory Visit: Payer: Medicare HMO | Admitting: Anesthesiology

## 2017-02-05 DIAGNOSIS — I10 Essential (primary) hypertension: Secondary | ICD-10-CM | POA: Insufficient documentation

## 2017-02-05 DIAGNOSIS — N1330 Unspecified hydronephrosis: Secondary | ICD-10-CM | POA: Insufficient documentation

## 2017-02-05 DIAGNOSIS — M069 Rheumatoid arthritis, unspecified: Secondary | ICD-10-CM | POA: Insufficient documentation

## 2017-02-05 DIAGNOSIS — Z87891 Personal history of nicotine dependence: Secondary | ICD-10-CM | POA: Diagnosis not present

## 2017-02-05 DIAGNOSIS — I4891 Unspecified atrial fibrillation: Secondary | ICD-10-CM | POA: Diagnosis not present

## 2017-02-05 DIAGNOSIS — E119 Type 2 diabetes mellitus without complications: Secondary | ICD-10-CM | POA: Diagnosis not present

## 2017-02-05 DIAGNOSIS — E785 Hyperlipidemia, unspecified: Secondary | ICD-10-CM | POA: Diagnosis not present

## 2017-02-05 DIAGNOSIS — I5189 Other ill-defined heart diseases: Secondary | ICD-10-CM | POA: Diagnosis not present

## 2017-02-05 DIAGNOSIS — K219 Gastro-esophageal reflux disease without esophagitis: Secondary | ICD-10-CM | POA: Insufficient documentation

## 2017-02-05 DIAGNOSIS — J449 Chronic obstructive pulmonary disease, unspecified: Secondary | ICD-10-CM | POA: Diagnosis not present

## 2017-02-05 DIAGNOSIS — Z7984 Long term (current) use of oral hypoglycemic drugs: Secondary | ICD-10-CM | POA: Insufficient documentation

## 2017-02-05 DIAGNOSIS — Z79899 Other long term (current) drug therapy: Secondary | ICD-10-CM | POA: Diagnosis not present

## 2017-02-05 DIAGNOSIS — Z7901 Long term (current) use of anticoagulants: Secondary | ICD-10-CM | POA: Diagnosis not present

## 2017-02-05 DIAGNOSIS — N35911 Unspecified urethral stricture, male, meatal: Secondary | ICD-10-CM | POA: Insufficient documentation

## 2017-02-05 DIAGNOSIS — F419 Anxiety disorder, unspecified: Secondary | ICD-10-CM | POA: Diagnosis not present

## 2017-02-05 DIAGNOSIS — N261 Atrophy of kidney (terminal): Secondary | ICD-10-CM | POA: Diagnosis not present

## 2017-02-05 DIAGNOSIS — D649 Anemia, unspecified: Secondary | ICD-10-CM | POA: Diagnosis not present

## 2017-02-05 DIAGNOSIS — Z950 Presence of cardiac pacemaker: Secondary | ICD-10-CM | POA: Insufficient documentation

## 2017-02-05 DIAGNOSIS — N368 Other specified disorders of urethra: Secondary | ICD-10-CM | POA: Diagnosis present

## 2017-02-05 HISTORY — PX: CYSTOSCOPY WITH URETHRAL DILATATION: SHX5125

## 2017-02-05 LAB — GLUCOSE, CAPILLARY: GLUCOSE-CAPILLARY: 81 mg/dL (ref 65–99)

## 2017-02-05 SURGERY — CYSTOSCOPY, WITH URETHRAL DILATION
Anesthesia: General | Site: Ureter | Wound class: Clean Contaminated

## 2017-02-05 MED ORDER — LIDOCAINE HCL 2 % EX GEL
CUTANEOUS | Status: AC
  Filled 2017-02-05: qty 10

## 2017-02-05 MED ORDER — BELLADONNA ALKALOIDS-OPIUM 16.2-60 MG RE SUPP
RECTAL | Status: DC | PRN
  Administered 2017-02-05: 1 via RECTAL

## 2017-02-05 MED ORDER — ONDANSETRON HCL 4 MG/2ML IJ SOLN
INTRAMUSCULAR | Status: AC
  Filled 2017-02-05: qty 2

## 2017-02-05 MED ORDER — MIDAZOLAM HCL 2 MG/2ML IJ SOLN
INTRAMUSCULAR | Status: AC
  Filled 2017-02-05: qty 2

## 2017-02-05 MED ORDER — BELLADONNA ALKALOIDS-OPIUM 16.2-60 MG RE SUPP
RECTAL | Status: AC
  Filled 2017-02-05: qty 1

## 2017-02-05 MED ORDER — CEFAZOLIN SODIUM-DEXTROSE 1-4 GM/50ML-% IV SOLN
1.0000 g | Freq: Once | INTRAVENOUS | Status: AC
  Administered 2017-02-05: 1 g via INTRAVENOUS

## 2017-02-05 MED ORDER — PROPOFOL 10 MG/ML IV BOLUS
INTRAVENOUS | Status: DC | PRN
Start: 2017-02-05 — End: 2017-02-05
  Administered 2017-02-05: 30 mg via INTRAVENOUS
  Administered 2017-02-05: 100 mg via INTRAVENOUS

## 2017-02-05 MED ORDER — LACTATED RINGERS IV SOLN
INTRAVENOUS | Status: DC | PRN
  Administered 2017-02-05: 11:00:00 via INTRAVENOUS

## 2017-02-05 MED ORDER — LIDOCAINE HCL (CARDIAC) 20 MG/ML IV SOLN
INTRAVENOUS | Status: DC | PRN
  Administered 2017-02-05: 100 mg via INTRAVENOUS

## 2017-02-05 MED ORDER — PHENYLEPHRINE HCL 10 MG/ML IJ SOLN
INTRAMUSCULAR | Status: DC | PRN
  Administered 2017-02-05 (×4): 100 ug via INTRAVENOUS

## 2017-02-05 MED ORDER — DEXAMETHASONE SODIUM PHOSPHATE 10 MG/ML IJ SOLN
INTRAMUSCULAR | Status: DC | PRN
  Administered 2017-02-05: 10 mg via INTRAVENOUS

## 2017-02-05 MED ORDER — PROPOFOL 10 MG/ML IV BOLUS
INTRAVENOUS | Status: AC
Start: 2017-02-05 — End: 2017-02-05
  Filled 2017-02-05: qty 20

## 2017-02-05 MED ORDER — FENTANYL CITRATE (PF) 100 MCG/2ML IJ SOLN
INTRAMUSCULAR | Status: DC | PRN
  Administered 2017-02-05: 50 ug via INTRAVENOUS

## 2017-02-05 MED ORDER — ONDANSETRON HCL 4 MG/2ML IJ SOLN
INTRAMUSCULAR | Status: DC | PRN
  Administered 2017-02-05: 4 mg via INTRAVENOUS

## 2017-02-05 MED ORDER — CEFAZOLIN SODIUM-DEXTROSE 1-4 GM/50ML-% IV SOLN
INTRAVENOUS | Status: AC
  Filled 2017-02-05: qty 50

## 2017-02-05 MED ORDER — SODIUM CHLORIDE 0.9 % IV SOLN
INTRAVENOUS | Status: DC
  Administered 2017-02-05: 10:00:00 via INTRAVENOUS

## 2017-02-05 MED ORDER — DEXAMETHASONE SODIUM PHOSPHATE 10 MG/ML IJ SOLN
INTRAMUSCULAR | Status: AC
  Filled 2017-02-05: qty 1

## 2017-02-05 MED ORDER — LIDOCAINE HCL (PF) 2 % IJ SOLN
INTRAMUSCULAR | Status: AC
  Filled 2017-02-05: qty 10

## 2017-02-05 MED ORDER — LIDOCAINE HCL 2 % EX GEL
CUTANEOUS | Status: DC | PRN
  Administered 2017-02-05: 1 via URETHRAL

## 2017-02-05 MED ORDER — FENTANYL CITRATE (PF) 100 MCG/2ML IJ SOLN
INTRAMUSCULAR | Status: AC
  Filled 2017-02-05: qty 2

## 2017-02-05 MED ORDER — IOTHALAMATE MEGLUMINE 43 % IV SOLN
INTRAVENOUS | Status: DC | PRN
  Administered 2017-02-05: 15 mL via URETHRAL

## 2017-02-05 SURGICAL SUPPLY — 21 items
BAG DRAIN CYSTO-URO LG1000N (MISCELLANEOUS) ×4 IMPLANT
CATH FOL LX CONE TIP  8F (CATHETERS)
CATH FOL LX CONE TIP 8F (CATHETERS) IMPLANT
CATH URETL 5X70 OPEN END (CATHETERS) ×4 IMPLANT
CONRAY 43 FOR UROLOGY 50M (MISCELLANEOUS) ×4 IMPLANT
GLOVE BIO SURGEON STRL SZ7 (GLOVE) ×4 IMPLANT
GLOVE BIO SURGEON STRL SZ7.5 (GLOVE) ×4 IMPLANT
GOWN STRL REUS W/ TWL LRG LVL4 (GOWN DISPOSABLE) ×2 IMPLANT
GOWN STRL REUS W/ TWL XL LVL3 (GOWN DISPOSABLE) ×2 IMPLANT
GOWN STRL REUS W/TWL LRG LVL4 (GOWN DISPOSABLE) ×2
GOWN STRL REUS W/TWL XL LVL3 (GOWN DISPOSABLE) ×2
KIT RM TURNOVER CYSTO AR (KITS) ×4 IMPLANT
PACK CYSTO AR (MISCELLANEOUS) ×4 IMPLANT
PREP PVP WINGED SPONGE (MISCELLANEOUS) ×4 IMPLANT
SET CYSTO W/LG BORE CLAMP LF (SET/KITS/TRAYS/PACK) ×4 IMPLANT
SOL .9 NS 3000ML IRR  AL (IV SOLUTION) ×2
SOL .9 NS 3000ML IRR UROMATIC (IV SOLUTION) ×2 IMPLANT
SOL PREP PVP 2OZ (MISCELLANEOUS) ×4
SOLUTION PREP PVP 2OZ (MISCELLANEOUS) ×2 IMPLANT
SURGILUBE 2OZ TUBE FLIPTOP (MISCELLANEOUS) ×4 IMPLANT
WATER STERILE IRR 1000ML POUR (IV SOLUTION) ×4 IMPLANT

## 2017-02-05 NOTE — Anesthesia Post-op Follow-up Note (Signed)
Anesthesia QCDR form completed.        

## 2017-02-05 NOTE — Transfer of Care (Signed)
Immediate Anesthesia Transfer of Care Note  Patient: Johnathan Hudson  Procedure(s) Performed: CYSTOSCOPY WITH URETHRAL DILATATION (Left Ureter)  Patient Location: PACU  Anesthesia Type:General  Level of Consciousness: sedated  Airway & Oxygen Therapy: Patient Spontanous Breathing and Patient connected to face mask oxygen  Post-op Assessment: Report given to RN and Post -op Vital signs reviewed and stable  Post vital signs: Reviewed and stable  Last Vitals:  Vitals:   02/05/17 0943 02/05/17 1216  BP: (!) 147/88 122/72  Pulse: 81 60  Resp: 16 10  Temp: (!) 36.3 C 36.8 C  SpO2: 98% 98%    Last Pain:  Vitals:   02/05/17 0943  TempSrc: Tympanic         Complications: No apparent anesthesia complications

## 2017-02-05 NOTE — Anesthesia Procedure Notes (Signed)
Procedure Name: LMA Insertion Date/Time: 02/05/2017 11:43 AM Performed by: Nelda Marseille, CRNA Pre-anesthesia Checklist: Patient identified, Patient being monitored, Timeout performed, Emergency Drugs available and Suction available Patient Re-evaluated:Patient Re-evaluated prior to induction Oxygen Delivery Method: Circle system utilized Preoxygenation: Pre-oxygenation with 100% oxygen Induction Type: IV induction Ventilation: Mask ventilation without difficulty LMA: LMA inserted LMA Size: 4.5 Tube type: Oral Number of attempts: 1 Placement Confirmation: positive ETCO2 and breath sounds checked- equal and bilateral Tube secured with: Tape Dental Injury: Teeth and Oropharynx as per pre-operative assessment

## 2017-02-05 NOTE — H&P (Signed)
Date of Initial H&P: 01/29/17  History reviewed, patient examined, no change in status, stable for surgery.

## 2017-02-05 NOTE — Anesthesia Preprocedure Evaluation (Addendum)
Anesthesia Evaluation  Patient identified by MRN, date of birth, ID band Patient awake    Reviewed: Allergy & Precautions, NPO status , Patient's Chart, lab work & pertinent test results  History of Anesthesia Complications Negative for: history of anesthetic complications  Airway Mallampati: II       Dental   Pulmonary COPD,  COPD inhaler, former smoker,           Cardiovascular hypertension, Pt. on medications (-) Past MI and (-) CHF (-) dysrhythmias (-) Valvular Problems/Murmurs     Neuro/Psych Anxiety    GI/Hepatic Neg liver ROS, neg GERD  ,  Endo/Other  diabetes, Type 2, Oral Hypoglycemic Agents  Renal/GU Renal InsufficiencyRenal disease     Musculoskeletal   Abdominal   Peds  Hematology  (+) anemia ,   Anesthesia Other Findings   Reproductive/Obstetrics                            Anesthesia Physical Anesthesia Plan  ASA: III  Anesthesia Plan: General   Post-op Pain Management:    Induction: Intravenous  PONV Risk Score and Plan:   Airway Management Planned: Nasal Cannula  Additional Equipment:   Intra-op Plan:   Post-operative Plan:   Informed Consent: I have reviewed the patients History and Physical, chart, labs and discussed the procedure including the risks, benefits and alternatives for the proposed anesthesia with the patient or authorized representative who has indicated his/her understanding and acceptance.     Plan Discussed with:   Anesthesia Plan Comments:         Anesthesia Quick Evaluation

## 2017-02-05 NOTE — Discharge Instructions (Addendum)
Cystoscopy, Care After Refer to this sheet in the next few weeks. These instructions provide you with information about caring for yourself after your procedure. Your health care provider may also give you more specific instructions. Your treatment has been planned according to current medical practices, but problems sometimes occur. Call your health care provider if you have any problems or questions after your procedure. What can I expect after the procedure? After the procedure, it is common to have:  Mild pain when you urinate. Pain should stop within a few minutes after you urinate. This may last for up to 1 week.  A small amount of blood in your urine for several days.  Feeling like you need to urinate but producing only a small amount of urine.  Follow these instructions at home:  Medicines  Take over-the-counter and prescription medicines only as told by your health care provider.  If you were prescribed an antibiotic medicine, take it as told by your health care provider. Do not stop taking the antibiotic even if you start to feel better. General instructions   Return to your normal activities as told by your health care provider. Ask your health care provider what activities are safe for you.  Do not drive for 24 hours if you received a sedative.  Watch for any blood in your urine. If the amount of blood in your urine increases, call your health care provider.  Follow instructions from your health care provider about eating or drinking restrictions.  If a tissue sample was removed for testing (biopsy) during your procedure, it is your responsibility to get your test results. Ask your health care provider or the department performing the test when your results will be ready.  Drink enough fluid to keep your urine clear or pale yellow.  Keep all follow-up visits as told by your health care provider. This is important. Contact a health care provider if:  You have pain that  gets worse or does not get better with medicine, especially pain when you urinate.  You have difficulty urinating. Get help right away if:  You have more blood in your urine.  You have blood clots in your urine.  You have abdominal pain.  You have a fever or chills.  You are unable to urinate. This information is not intended to replace advice given to you by your health care provider. Make sure you discuss any questions you have with your health care provider. Document Released: 09/29/2004 Document Revised: 08/18/2015 Document Reviewed: 01/27/2015 Elsevier Interactive Patient Education  2017 [place]   1) The drugs that you were given will stay in your system until tomorrow so for the next 24 hours you should not:  A) Drive an automobile B) Make any legal decisions C) Drink any alcoholic beverage   2) You may resume regular meals tomorrow.  Today it is better to start with liquids and gradually work up to solid foods.  You may eat anything you prefer, but it is better to start with liquids, then soup and crackers, and gradually work up to solid foods.   3) Please notify your doctor immediately if you have any unusual bleeding, trouble breathing, redness and pain at the surgery site, drainage, fever, or pain not relieved by medication.    4) Additional Instructions:        Please contact your physician with any problems or Same Day Surgery at [phone], Monday through Friday 6 am to 4  pm, or [place] at [facility] number at [phone].

## 2017-02-05 NOTE — Anesthesia Postprocedure Evaluation (Signed)
Anesthesia Post Note  Patient: Johnathan Hudson  Procedure(s) Performed: CYSTOSCOPY WITH URETHRAL DILATATION (Left Ureter)  Patient location during evaluation: PACU Anesthesia Type: General Level of consciousness: awake and alert Pain management: pain level controlled Vital Signs Assessment: post-procedure vital signs reviewed and stable Respiratory status: spontaneous breathing and respiratory function stable Cardiovascular status: stable Anesthetic complications: no     Last Vitals:  Vitals:   02/05/17 1300 02/05/17 1310  BP: 124/74 131/77  Pulse: 60 65  Resp: 12 12  Temp: 36.7 C (!) 36.3 C  SpO2: 94% 96%    Last Pain:  Vitals:   02/05/17 1310  TempSrc: Temporal  PainSc: 0-No pain                 [name]

## 2017-02-05 NOTE — Op Note (Signed)
Preoperative diagnosis:1. Left dilated ureter                                           2.Left renal atrophy  Postoperative diagnosis:   1. Left dilated ureter                                                2.Left renal atrophy                                                3. Urethral meatal stenosis  Procedure:1. Urethral dilatation                     2. Cystoscopy    Surgeon: Otelia Limes. Yves Dill MD  Anesthesia: General  Indications:See the history and physical. After informed consent the above procedure(s) were requested     Technique and findings:  After adequate general anesthesia been obtained the patient was placed into dorsal lithotomy position and the perineum was prepped and draped in the usual fashion.The meatus of the urethra was very stenotic and had the appearance of BXO. The meatus was then dilated up to 52 [place] with the male dilators. The 21 French cystoscope was coupled to the camera and then visually advanced into the bladder. Prostatic fossa was consistent with TURP defect. The bladder was moderately trabeculated. No bladder tumors were identified. The right ureteral orifice was identified and had clear efflux. The left ureteral orifice could not be identified.At this point the bladder was drained and the cystoscope was removed.10 cc of viscous Xylocaine was instilled within the urethra. A B&O suppository was placed. The procedure was then terminated and patient transferred to the recovery room in stable condition.

## 2017-02-06 ENCOUNTER — Encounter: Payer: Self-pay | Admitting: Urology

## 2017-02-12 DIAGNOSIS — N401 Enlarged prostate with lower urinary tract symptoms: Secondary | ICD-10-CM | POA: Diagnosis not present

## 2017-02-12 DIAGNOSIS — N35911 Unspecified urethral stricture, male, meatal: Secondary | ICD-10-CM | POA: Diagnosis not present

## 2017-02-12 DIAGNOSIS — N1339 Other hydronephrosis: Secondary | ICD-10-CM | POA: Diagnosis not present

## 2017-02-26 DIAGNOSIS — N400 Enlarged prostate without lower urinary tract symptoms: Secondary | ICD-10-CM | POA: Diagnosis not present

## 2017-02-26 DIAGNOSIS — N1339 Other hydronephrosis: Secondary | ICD-10-CM | POA: Diagnosis not present

## 2017-02-26 DIAGNOSIS — R972 Elevated prostate specific antigen [PSA]: Secondary | ICD-10-CM | POA: Diagnosis not present

## 2017-02-26 DIAGNOSIS — R3914 Feeling of incomplete bladder emptying: Secondary | ICD-10-CM | POA: Diagnosis not present

## 2017-02-26 DIAGNOSIS — N35911 Unspecified urethral stricture, male, meatal: Secondary | ICD-10-CM | POA: Diagnosis not present

## 2017-02-26 DIAGNOSIS — N189 Chronic kidney disease, unspecified: Secondary | ICD-10-CM | POA: Diagnosis not present

## 2017-02-26 DIAGNOSIS — N401 Enlarged prostate with lower urinary tract symptoms: Secondary | ICD-10-CM | POA: Diagnosis not present

## 2017-03-21 DIAGNOSIS — N401 Enlarged prostate with lower urinary tract symptoms: Secondary | ICD-10-CM | POA: Diagnosis not present

## 2017-04-02 DIAGNOSIS — M47816 Spondylosis without myelopathy or radiculopathy, lumbar region: Secondary | ICD-10-CM | POA: Diagnosis not present

## 2017-04-02 DIAGNOSIS — M48062 Spinal stenosis, lumbar region with neurogenic claudication: Secondary | ICD-10-CM | POA: Diagnosis not present

## 2017-04-02 DIAGNOSIS — M5136 Other intervertebral disc degeneration, lumbar region: Secondary | ICD-10-CM | POA: Diagnosis not present

## 2017-04-02 DIAGNOSIS — M5416 Radiculopathy, lumbar region: Secondary | ICD-10-CM | POA: Diagnosis not present

## 2017-04-04 DIAGNOSIS — N390 Urinary tract infection, site not specified: Secondary | ICD-10-CM | POA: Diagnosis not present

## 2017-04-04 DIAGNOSIS — N1339 Other hydronephrosis: Secondary | ICD-10-CM | POA: Diagnosis not present

## 2017-04-12 DIAGNOSIS — I129 Hypertensive chronic kidney disease with stage 1 through stage 4 chronic kidney disease, or unspecified chronic kidney disease: Secondary | ICD-10-CM | POA: Diagnosis not present

## 2017-04-12 DIAGNOSIS — E1122 Type 2 diabetes mellitus with diabetic chronic kidney disease: Secondary | ICD-10-CM | POA: Diagnosis not present

## 2017-04-12 DIAGNOSIS — N183 Chronic kidney disease, stage 3 (moderate): Secondary | ICD-10-CM | POA: Diagnosis not present

## 2017-04-12 DIAGNOSIS — R319 Hematuria, unspecified: Secondary | ICD-10-CM | POA: Diagnosis not present

## 2017-04-12 DIAGNOSIS — R809 Proteinuria, unspecified: Secondary | ICD-10-CM | POA: Diagnosis not present

## 2017-04-18 DIAGNOSIS — E113293 Type 2 diabetes mellitus with mild nonproliferative diabetic retinopathy without macular edema, bilateral: Secondary | ICD-10-CM | POA: Diagnosis not present

## 2017-04-18 DIAGNOSIS — H35433 Paving stone degeneration of retina, bilateral: Secondary | ICD-10-CM | POA: Diagnosis not present

## 2017-04-18 DIAGNOSIS — H35341 Macular cyst, hole, or pseudohole, right eye: Secondary | ICD-10-CM | POA: Diagnosis not present

## 2017-04-18 DIAGNOSIS — H35371 Puckering of macula, right eye: Secondary | ICD-10-CM | POA: Diagnosis not present

## 2017-05-03 DIAGNOSIS — H35371 Puckering of macula, right eye: Secondary | ICD-10-CM | POA: Diagnosis not present

## 2017-05-09 DIAGNOSIS — H35341 Macular cyst, hole, or pseudohole, right eye: Secondary | ICD-10-CM | POA: Diagnosis not present

## 2017-05-09 DIAGNOSIS — H35371 Puckering of macula, right eye: Secondary | ICD-10-CM | POA: Diagnosis not present

## 2017-05-15 DIAGNOSIS — I480 Paroxysmal atrial fibrillation: Secondary | ICD-10-CM | POA: Diagnosis not present

## 2017-05-15 DIAGNOSIS — I10 Essential (primary) hypertension: Secondary | ICD-10-CM | POA: Diagnosis not present

## 2017-05-15 DIAGNOSIS — E119 Type 2 diabetes mellitus without complications: Secondary | ICD-10-CM | POA: Diagnosis not present

## 2017-05-15 DIAGNOSIS — G252 Other specified forms of tremor: Secondary | ICD-10-CM | POA: Diagnosis not present

## 2017-05-15 DIAGNOSIS — N183 Chronic kidney disease, stage 3 (moderate): Secondary | ICD-10-CM | POA: Diagnosis not present

## 2017-05-15 DIAGNOSIS — M48062 Spinal stenosis, lumbar region with neurogenic claudication: Secondary | ICD-10-CM | POA: Diagnosis not present

## 2017-05-22 DIAGNOSIS — M48062 Spinal stenosis, lumbar region with neurogenic claudication: Secondary | ICD-10-CM | POA: Diagnosis not present

## 2017-05-22 DIAGNOSIS — N183 Chronic kidney disease, stage 3 (moderate): Secondary | ICD-10-CM | POA: Diagnosis not present

## 2017-05-22 DIAGNOSIS — Z0000 Encounter for general adult medical examination without abnormal findings: Secondary | ICD-10-CM | POA: Diagnosis not present

## 2017-05-22 DIAGNOSIS — I10 Essential (primary) hypertension: Secondary | ICD-10-CM | POA: Diagnosis not present

## 2017-05-22 DIAGNOSIS — E119 Type 2 diabetes mellitus without complications: Secondary | ICD-10-CM | POA: Diagnosis not present

## 2017-05-22 DIAGNOSIS — G252 Other specified forms of tremor: Secondary | ICD-10-CM | POA: Diagnosis not present

## 2017-05-22 DIAGNOSIS — Z950 Presence of cardiac pacemaker: Secondary | ICD-10-CM | POA: Diagnosis not present

## 2017-05-22 DIAGNOSIS — I480 Paroxysmal atrial fibrillation: Secondary | ICD-10-CM | POA: Diagnosis not present

## 2017-05-30 DIAGNOSIS — H35341 Macular cyst, hole, or pseudohole, right eye: Secondary | ICD-10-CM | POA: Diagnosis not present

## 2017-05-30 DIAGNOSIS — H35351 Cystoid macular degeneration, right eye: Secondary | ICD-10-CM | POA: Diagnosis not present

## 2017-06-03 DIAGNOSIS — H35341 Macular cyst, hole, or pseudohole, right eye: Secondary | ICD-10-CM | POA: Diagnosis not present

## 2017-06-03 DIAGNOSIS — H2511 Age-related nuclear cataract, right eye: Secondary | ICD-10-CM | POA: Diagnosis not present

## 2017-06-03 DIAGNOSIS — H25011 Cortical age-related cataract, right eye: Secondary | ICD-10-CM | POA: Diagnosis not present

## 2017-06-03 DIAGNOSIS — E113293 Type 2 diabetes mellitus with mild nonproliferative diabetic retinopathy without macular edema, bilateral: Secondary | ICD-10-CM | POA: Diagnosis not present

## 2017-06-03 DIAGNOSIS — H3589 Other specified retinal disorders: Secondary | ICD-10-CM | POA: Diagnosis not present

## 2017-06-03 DIAGNOSIS — Z961 Presence of intraocular lens: Secondary | ICD-10-CM | POA: Diagnosis not present

## 2017-06-03 DIAGNOSIS — H35371 Puckering of macula, right eye: Secondary | ICD-10-CM | POA: Diagnosis not present

## 2017-06-25 DIAGNOSIS — I495 Sick sinus syndrome: Secondary | ICD-10-CM | POA: Diagnosis not present

## 2017-06-25 DIAGNOSIS — Z789 Other specified health status: Secondary | ICD-10-CM | POA: Diagnosis not present

## 2017-06-25 DIAGNOSIS — E119 Type 2 diabetes mellitus without complications: Secondary | ICD-10-CM | POA: Diagnosis not present

## 2017-06-25 DIAGNOSIS — R0602 Shortness of breath: Secondary | ICD-10-CM | POA: Diagnosis not present

## 2017-06-25 DIAGNOSIS — I10 Essential (primary) hypertension: Secondary | ICD-10-CM | POA: Diagnosis not present

## 2017-06-25 DIAGNOSIS — E7800 Pure hypercholesterolemia, unspecified: Secondary | ICD-10-CM | POA: Diagnosis not present

## 2017-06-25 DIAGNOSIS — I480 Paroxysmal atrial fibrillation: Secondary | ICD-10-CM | POA: Diagnosis not present

## 2017-06-25 DIAGNOSIS — Z950 Presence of cardiac pacemaker: Secondary | ICD-10-CM | POA: Diagnosis not present

## 2017-06-27 DIAGNOSIS — H35351 Cystoid macular degeneration, right eye: Secondary | ICD-10-CM | POA: Diagnosis not present

## 2017-07-01 DIAGNOSIS — M48062 Spinal stenosis, lumbar region with neurogenic claudication: Secondary | ICD-10-CM | POA: Diagnosis not present

## 2017-07-01 DIAGNOSIS — M47816 Spondylosis without myelopathy or radiculopathy, lumbar region: Secondary | ICD-10-CM | POA: Diagnosis not present

## 2017-07-01 DIAGNOSIS — M5416 Radiculopathy, lumbar region: Secondary | ICD-10-CM | POA: Diagnosis not present

## 2017-07-01 DIAGNOSIS — M5136 Other intervertebral disc degeneration, lumbar region: Secondary | ICD-10-CM | POA: Diagnosis not present

## 2017-07-02 DIAGNOSIS — H2511 Age-related nuclear cataract, right eye: Secondary | ICD-10-CM | POA: Diagnosis not present

## 2017-07-02 DIAGNOSIS — H25811 Combined forms of age-related cataract, right eye: Secondary | ICD-10-CM | POA: Diagnosis not present

## 2017-07-08 DIAGNOSIS — H2511 Age-related nuclear cataract, right eye: Secondary | ICD-10-CM | POA: Diagnosis not present

## 2017-07-18 DIAGNOSIS — H2511 Age-related nuclear cataract, right eye: Secondary | ICD-10-CM | POA: Diagnosis not present

## 2017-07-18 DIAGNOSIS — H35351 Cystoid macular degeneration, right eye: Secondary | ICD-10-CM | POA: Diagnosis not present

## 2017-07-22 DIAGNOSIS — H353112 Nonexudative age-related macular degeneration, right eye, intermediate dry stage: Secondary | ICD-10-CM | POA: Diagnosis not present

## 2017-07-22 DIAGNOSIS — H18231 Secondary corneal edema, right eye: Secondary | ICD-10-CM | POA: Diagnosis not present

## 2017-07-22 DIAGNOSIS — H353123 Nonexudative age-related macular degeneration, left eye, advanced atrophic without subfoveal involvement: Secondary | ICD-10-CM | POA: Diagnosis not present

## 2017-07-22 DIAGNOSIS — E113293 Type 2 diabetes mellitus with mild nonproliferative diabetic retinopathy without macular edema, bilateral: Secondary | ICD-10-CM | POA: Diagnosis not present

## 2017-07-29 DIAGNOSIS — H18231 Secondary corneal edema, right eye: Secondary | ICD-10-CM | POA: Diagnosis not present

## 2017-07-29 DIAGNOSIS — H35341 Macular cyst, hole, or pseudohole, right eye: Secondary | ICD-10-CM | POA: Diagnosis not present

## 2017-09-09 DIAGNOSIS — I10 Essential (primary) hypertension: Secondary | ICD-10-CM | POA: Diagnosis not present

## 2017-09-09 DIAGNOSIS — Z950 Presence of cardiac pacemaker: Secondary | ICD-10-CM | POA: Diagnosis not present

## 2017-09-09 DIAGNOSIS — N183 Chronic kidney disease, stage 3 (moderate): Secondary | ICD-10-CM | POA: Diagnosis not present

## 2017-09-09 DIAGNOSIS — Z0000 Encounter for general adult medical examination without abnormal findings: Secondary | ICD-10-CM | POA: Diagnosis not present

## 2017-09-09 DIAGNOSIS — I480 Paroxysmal atrial fibrillation: Secondary | ICD-10-CM | POA: Diagnosis not present

## 2017-09-09 DIAGNOSIS — G252 Other specified forms of tremor: Secondary | ICD-10-CM | POA: Diagnosis not present

## 2017-09-09 DIAGNOSIS — M48062 Spinal stenosis, lumbar region with neurogenic claudication: Secondary | ICD-10-CM | POA: Diagnosis not present

## 2017-09-09 DIAGNOSIS — E119 Type 2 diabetes mellitus without complications: Secondary | ICD-10-CM | POA: Diagnosis not present

## 2017-09-16 DIAGNOSIS — E119 Type 2 diabetes mellitus without complications: Secondary | ICD-10-CM | POA: Diagnosis not present

## 2017-09-16 DIAGNOSIS — I480 Paroxysmal atrial fibrillation: Secondary | ICD-10-CM | POA: Diagnosis not present

## 2017-09-16 DIAGNOSIS — Z950 Presence of cardiac pacemaker: Secondary | ICD-10-CM | POA: Diagnosis not present

## 2017-09-16 DIAGNOSIS — E7800 Pure hypercholesterolemia, unspecified: Secondary | ICD-10-CM | POA: Diagnosis not present

## 2017-09-16 DIAGNOSIS — G252 Other specified forms of tremor: Secondary | ICD-10-CM | POA: Diagnosis not present

## 2017-09-16 DIAGNOSIS — I10 Essential (primary) hypertension: Secondary | ICD-10-CM | POA: Diagnosis not present

## 2017-09-16 DIAGNOSIS — N183 Chronic kidney disease, stage 3 (moderate): Secondary | ICD-10-CM | POA: Diagnosis not present

## 2017-09-30 DIAGNOSIS — M48062 Spinal stenosis, lumbar region with neurogenic claudication: Secondary | ICD-10-CM | POA: Diagnosis not present

## 2017-09-30 DIAGNOSIS — M5136 Other intervertebral disc degeneration, lumbar region: Secondary | ICD-10-CM | POA: Diagnosis not present

## 2017-09-30 DIAGNOSIS — M47816 Spondylosis without myelopathy or radiculopathy, lumbar region: Secondary | ICD-10-CM | POA: Diagnosis not present

## 2017-09-30 DIAGNOSIS — M5416 Radiculopathy, lumbar region: Secondary | ICD-10-CM | POA: Diagnosis not present

## 2017-10-10 DIAGNOSIS — H35353 Cystoid macular degeneration, bilateral: Secondary | ICD-10-CM | POA: Diagnosis not present

## 2017-10-10 DIAGNOSIS — H35433 Paving stone degeneration of retina, bilateral: Secondary | ICD-10-CM | POA: Diagnosis not present

## 2017-10-10 DIAGNOSIS — H18231 Secondary corneal edema, right eye: Secondary | ICD-10-CM | POA: Diagnosis not present

## 2017-10-10 DIAGNOSIS — E113293 Type 2 diabetes mellitus with mild nonproliferative diabetic retinopathy without macular edema, bilateral: Secondary | ICD-10-CM | POA: Diagnosis not present

## 2017-10-17 DIAGNOSIS — N183 Chronic kidney disease, stage 3 (moderate): Secondary | ICD-10-CM | POA: Diagnosis not present

## 2017-10-17 DIAGNOSIS — R809 Proteinuria, unspecified: Secondary | ICD-10-CM | POA: Diagnosis not present

## 2017-10-17 DIAGNOSIS — I129 Hypertensive chronic kidney disease with stage 1 through stage 4 chronic kidney disease, or unspecified chronic kidney disease: Secondary | ICD-10-CM | POA: Diagnosis not present

## 2017-10-17 DIAGNOSIS — N2581 Secondary hyperparathyroidism of renal origin: Secondary | ICD-10-CM | POA: Diagnosis not present

## 2017-10-17 DIAGNOSIS — E1122 Type 2 diabetes mellitus with diabetic chronic kidney disease: Secondary | ICD-10-CM | POA: Diagnosis not present

## 2017-12-12 DIAGNOSIS — H353131 Nonexudative age-related macular degeneration, bilateral, early dry stage: Secondary | ICD-10-CM | POA: Diagnosis not present

## 2017-12-12 DIAGNOSIS — H35342 Macular cyst, hole, or pseudohole, left eye: Secondary | ICD-10-CM | POA: Diagnosis not present

## 2017-12-12 DIAGNOSIS — H35371 Puckering of macula, right eye: Secondary | ICD-10-CM | POA: Diagnosis not present

## 2017-12-12 DIAGNOSIS — E113293 Type 2 diabetes mellitus with mild nonproliferative diabetic retinopathy without macular edema, bilateral: Secondary | ICD-10-CM | POA: Diagnosis not present

## 2017-12-25 DIAGNOSIS — Z950 Presence of cardiac pacemaker: Secondary | ICD-10-CM | POA: Diagnosis not present

## 2017-12-25 DIAGNOSIS — E119 Type 2 diabetes mellitus without complications: Secondary | ICD-10-CM | POA: Diagnosis not present

## 2017-12-25 DIAGNOSIS — I10 Essential (primary) hypertension: Secondary | ICD-10-CM | POA: Diagnosis not present

## 2017-12-25 DIAGNOSIS — R0602 Shortness of breath: Secondary | ICD-10-CM | POA: Diagnosis not present

## 2017-12-25 DIAGNOSIS — I480 Paroxysmal atrial fibrillation: Secondary | ICD-10-CM | POA: Diagnosis not present

## 2017-12-25 DIAGNOSIS — I495 Sick sinus syndrome: Secondary | ICD-10-CM | POA: Diagnosis not present

## 2017-12-30 DIAGNOSIS — M47816 Spondylosis without myelopathy or radiculopathy, lumbar region: Secondary | ICD-10-CM | POA: Diagnosis not present

## 2017-12-30 DIAGNOSIS — M5416 Radiculopathy, lumbar region: Secondary | ICD-10-CM | POA: Diagnosis not present

## 2017-12-30 DIAGNOSIS — M48062 Spinal stenosis, lumbar region with neurogenic claudication: Secondary | ICD-10-CM | POA: Diagnosis not present

## 2017-12-30 DIAGNOSIS — M5136 Other intervertebral disc degeneration, lumbar region: Secondary | ICD-10-CM | POA: Diagnosis not present

## 2018-01-09 DIAGNOSIS — H35353 Cystoid macular degeneration, bilateral: Secondary | ICD-10-CM | POA: Diagnosis not present

## 2018-01-09 DIAGNOSIS — H18231 Secondary corneal edema, right eye: Secondary | ICD-10-CM | POA: Diagnosis not present

## 2018-01-09 DIAGNOSIS — E113293 Type 2 diabetes mellitus with mild nonproliferative diabetic retinopathy without macular edema, bilateral: Secondary | ICD-10-CM | POA: Diagnosis not present

## 2018-01-10 DIAGNOSIS — N183 Chronic kidney disease, stage 3 (moderate): Secondary | ICD-10-CM | POA: Diagnosis not present

## 2018-01-10 DIAGNOSIS — I480 Paroxysmal atrial fibrillation: Secondary | ICD-10-CM | POA: Diagnosis not present

## 2018-01-10 DIAGNOSIS — I10 Essential (primary) hypertension: Secondary | ICD-10-CM | POA: Diagnosis not present

## 2018-01-10 DIAGNOSIS — G252 Other specified forms of tremor: Secondary | ICD-10-CM | POA: Diagnosis not present

## 2018-01-10 DIAGNOSIS — E119 Type 2 diabetes mellitus without complications: Secondary | ICD-10-CM | POA: Diagnosis not present

## 2018-01-10 DIAGNOSIS — Z950 Presence of cardiac pacemaker: Secondary | ICD-10-CM | POA: Diagnosis not present

## 2018-01-10 DIAGNOSIS — E7800 Pure hypercholesterolemia, unspecified: Secondary | ICD-10-CM | POA: Diagnosis not present

## 2018-01-17 DIAGNOSIS — I480 Paroxysmal atrial fibrillation: Secondary | ICD-10-CM | POA: Diagnosis not present

## 2018-01-17 DIAGNOSIS — Z950 Presence of cardiac pacemaker: Secondary | ICD-10-CM | POA: Diagnosis not present

## 2018-01-17 DIAGNOSIS — N183 Chronic kidney disease, stage 3 (moderate): Secondary | ICD-10-CM | POA: Diagnosis not present

## 2018-01-17 DIAGNOSIS — R251 Tremor, unspecified: Secondary | ICD-10-CM | POA: Diagnosis not present

## 2018-01-17 DIAGNOSIS — E1122 Type 2 diabetes mellitus with diabetic chronic kidney disease: Secondary | ICD-10-CM | POA: Diagnosis not present

## 2018-01-17 DIAGNOSIS — I10 Essential (primary) hypertension: Secondary | ICD-10-CM | POA: Diagnosis not present

## 2018-01-17 DIAGNOSIS — M48062 Spinal stenosis, lumbar region with neurogenic claudication: Secondary | ICD-10-CM | POA: Diagnosis not present

## 2018-01-17 DIAGNOSIS — F411 Generalized anxiety disorder: Secondary | ICD-10-CM | POA: Diagnosis not present

## 2018-01-17 DIAGNOSIS — Z720 Tobacco use: Secondary | ICD-10-CM | POA: Diagnosis not present

## 2018-01-19 ENCOUNTER — Emergency Department
Admission: EM | Admit: 2018-01-19 | Discharge: 2018-01-19 | Disposition: A | Discharge status: Home / Self Care | Payer: Medicare HMO | Attending: Emergency Medicine | Admitting: Emergency Medicine

## 2018-01-19 ENCOUNTER — Other Ambulatory Visit: Payer: Self-pay

## 2018-01-19 ENCOUNTER — Emergency Department: Payer: Medicare HMO

## 2018-01-19 DIAGNOSIS — I129 Hypertensive chronic kidney disease with stage 1 through stage 4 chronic kidney disease, or unspecified chronic kidney disease: Secondary | ICD-10-CM | POA: Insufficient documentation

## 2018-01-19 DIAGNOSIS — I774 Celiac artery compression syndrome: Secondary | ICD-10-CM | POA: Diagnosis not present

## 2018-01-19 DIAGNOSIS — J180 Bronchopneumonia, unspecified organism: Secondary | ICD-10-CM | POA: Insufficient documentation

## 2018-01-19 DIAGNOSIS — Z950 Presence of cardiac pacemaker: Secondary | ICD-10-CM | POA: Diagnosis not present

## 2018-01-19 DIAGNOSIS — N189 Chronic kidney disease, unspecified: Secondary | ICD-10-CM | POA: Insufficient documentation

## 2018-01-19 DIAGNOSIS — Z7984 Long term (current) use of oral hypoglycemic drugs: Secondary | ICD-10-CM | POA: Diagnosis not present

## 2018-01-19 DIAGNOSIS — Z79899 Other long term (current) drug therapy: Secondary | ICD-10-CM | POA: Diagnosis not present

## 2018-01-19 DIAGNOSIS — Z7901 Long term (current) use of anticoagulants: Secondary | ICD-10-CM | POA: Insufficient documentation

## 2018-01-19 DIAGNOSIS — N390 Urinary tract infection, site not specified: Secondary | ICD-10-CM | POA: Diagnosis not present

## 2018-01-19 DIAGNOSIS — J449 Chronic obstructive pulmonary disease, unspecified: Secondary | ICD-10-CM | POA: Diagnosis not present

## 2018-01-19 DIAGNOSIS — F17220 Nicotine dependence, chewing tobacco, uncomplicated: Secondary | ICD-10-CM | POA: Diagnosis not present

## 2018-01-19 DIAGNOSIS — M545 Low back pain: Secondary | ICD-10-CM | POA: Diagnosis not present

## 2018-01-19 DIAGNOSIS — R109 Unspecified abdominal pain: Secondary | ICD-10-CM

## 2018-01-19 DIAGNOSIS — E1122 Type 2 diabetes mellitus with diabetic chronic kidney disease: Secondary | ICD-10-CM | POA: Diagnosis not present

## 2018-01-19 DIAGNOSIS — M549 Dorsalgia, unspecified: Secondary | ICD-10-CM | POA: Diagnosis present

## 2018-01-19 LAB — CBC WITH DIFFERENTIAL/PLATELET
Abs Immature Granulocytes: 0.04 10*3/uL (ref 0.00–0.07)
BASOS ABS: 0 10*3/uL (ref 0.0–0.1)
Basophils Relative: 0 %
Eosinophils Absolute: 0.1 10*3/uL (ref 0.0–0.5)
Eosinophils Relative: 1 %
HEMATOCRIT: 43 % (ref 39.0–52.0)
Hemoglobin: 13.4 g/dL (ref 13.0–17.0)
Immature Granulocytes: 1 %
LYMPHS ABS: 0.9 10*3/uL (ref 0.7–4.0)
LYMPHS PCT: 12 %
MCH: 29.8 pg (ref 26.0–34.0)
MCHC: 31.2 g/dL (ref 30.0–36.0)
MCV: 95.6 fL (ref 80.0–100.0)
Monocytes Absolute: 0.5 10*3/uL (ref 0.1–1.0)
Monocytes Relative: 6 %
NRBC: 0 % (ref 0.0–0.2)
Neutro Abs: 5.8 10*3/uL (ref 1.7–7.7)
Neutrophils Relative %: 80 %
PLATELETS: 171 10*3/uL (ref 150–400)
RBC: 4.5 MIL/uL (ref 4.22–5.81)
RDW: 13.2 % (ref 11.5–15.5)
WBC: 7.3 10*3/uL (ref 4.0–10.5)

## 2018-01-19 LAB — COMPREHENSIVE METABOLIC PANEL
ALT: 10 U/L (ref 0–44)
AST: 18 U/L (ref 15–41)
Albumin: 4.3 g/dL (ref 3.5–5.0)
Alkaline Phosphatase: 46 U/L (ref 38–126)
Anion gap: 8 (ref 5–15)
BUN: 17 mg/dL (ref 8–23)
CHLORIDE: 106 mmol/L (ref 98–111)
CO2: 25 mmol/L (ref 22–32)
CREATININE: 1.67 mg/dL — AB (ref 0.61–1.24)
Calcium: 9.7 mg/dL (ref 8.9–10.3)
GFR calc non Af Amer: 37 mL/min — ABNORMAL LOW (ref >60)
GFR, EST AFRICAN AMERICAN: 42 mL/min — AB (ref >60)
Glucose, Bld: 106 mg/dL — ABNORMAL HIGH (ref 70–99)
Potassium: 4.7 mmol/L (ref 3.5–5.1)
SODIUM: 139 mmol/L (ref 135–145)
Total Bilirubin: 0.7 mg/dL (ref 0.3–1.2)
Total Protein: 7.5 g/dL (ref 6.5–8.1)

## 2018-01-19 LAB — URINALYSIS, COMPLETE (UACMP) WITH MICROSCOPIC
BACTERIA UA: NONE SEEN
Bilirubin Urine: NEGATIVE
Glucose, UA: NEGATIVE mg/dL
HGB URINE DIPSTICK: NEGATIVE
Ketones, ur: NEGATIVE mg/dL
NITRITE: NEGATIVE
PROTEIN: NEGATIVE mg/dL
Specific Gravity, Urine: 1.017 (ref 1.005–1.030)
pH: 5 (ref 5.0–8.0)

## 2018-01-19 MED ORDER — MORPHINE SULFATE (PF) 4 MG/ML IV SOLN
4.0000 mg | Freq: Once | INTRAVENOUS | Status: AC
  Administered 2018-01-19: 4 mg via INTRAVENOUS
  Filled 2018-01-19: qty 1

## 2018-01-19 MED ORDER — SODIUM CHLORIDE 0.9 % IV SOLN
1.0000 g | Freq: Once | INTRAVENOUS | Status: AC
  Administered 2018-01-19: 1 g via INTRAVENOUS
  Filled 2018-01-19: qty 10

## 2018-01-19 MED ORDER — OXYCODONE-ACETAMINOPHEN 5-325 MG PO TABS: 1.0000 | ORAL_TABLET | ORAL | 0 refills | Status: DC | PRN

## 2018-01-19 MED ORDER — MORPHINE SULFATE (PF) 4 MG/ML IV SOLN
4.0000 mg | Freq: Once | INTRAVENOUS | Status: AC
Start: 2018-01-19 — End: 2018-01-19
  Administered 2018-01-19: 4 mg via INTRAVENOUS
  Filled 2018-01-19: qty 1

## 2018-01-19 MED ORDER — SODIUM CHLORIDE 0.9 % IV BOLUS
1000.0000 mL | Freq: Once | INTRAVENOUS | Status: AC
  Administered 2018-01-19: 1000 mL via INTRAVENOUS

## 2018-01-19 MED ORDER — IOHEXOL 350 MG/ML SOLN
75.0000 mL | Freq: Once | INTRAVENOUS | Status: AC | PRN
  Administered 2018-01-19: 75 mL via INTRAVENOUS
  Filled 2018-01-19: qty 75

## 2018-01-19 MED ORDER — LEVOFLOXACIN 500 MG PO TABS: 500.0000 mg | ORAL_TABLET | Freq: Every day | ORAL | 0 refills | Status: DC

## 2018-01-19 NOTE — ED Notes (Signed)
Pain  r  Flank  Pain started  Last  Pm  Denies   Any nausea or  Vomiting  Denies any injury

## 2018-01-19 NOTE — Discharge Instructions (Signed)
Follow-up with your regular doctor in 3 to 5 days if not improving.  Return to the emergency department if worsening.  Follow-up with your regular doctor in 1 to 2 weeks to discuss a repeat CT of your chest.  Take the medication as prescribed.

## 2018-01-19 NOTE — ED Notes (Signed)
Patient and  Son  Left  Papers in room when discharged    Message left with patients wife that papers would be at the front desk

## 2018-01-19 NOTE — ED Provider Notes (Signed)
[facility] Emergency Department Provider Note  ____________________________________________   None    (approximate)  I have reviewed the triage vital signs and the nursing notes.   HISTORY  Chief Complaint No chief complaint on file.    HPI Johnathan Hudson is a 84 y.o. male presents to the emergency department complaining of back pain.  He states this is different than his normal back pain.  It is in the right flank type area.  The patient is unsure if he has vascular disease such as an aneurysm they are watching.  He denies any fever or chills.  Denies any blood in the urine.  He denies any vomiting or diarrhea.  He denies any known injury.  The pain started last night.    Past Medical History:  Diagnosis Date  . Anxiety   . Arthritis    rheumatoid  . Benign essential tremor   . Cardiomyopathy ([place])    mild  . Chronic kidney disease    kidney stones  . COPD (chronic obstructive pulmonary disease) ([place])   . Diabetes mellitus without complication ([place])   . Dysrhythmia    atrial fib  . Episodic atrial fibrillation ([place])   . Hyperlipemia   . Hypertension   . Lumbar stenosis with neurogenic claudication   . Osteoarthritis   . Psoriasis   . Renal insufficiency     There are no active problems to display for this patient.   Past Surgical History:  Procedure Laterality Date  . CYSTOSCOPY  1993 and 2004  . CYSTOSCOPY WITH URETHRAL DILATATION Left 02/05/2017   Procedure: CYSTOSCOPY WITH URETHRAL DILATATION;  Surgeon: Royston Cowper, MD;  Location: [facility] ORS;  Service: Urology;  Laterality: Left;  . INSERT / REPLACE / REMOVE PACEMAKER    . KIDNEY STONE SURGERY Left   . LASER OF PROSTATE W/ GREEN LIGHT PVP  2004   photovaporization of prostate with greelight laser   . ORIF ANKLE FRACTURE Right   . PACEMAKER INSERTION    . PACEMAKER INSERTION N/A 03/01/2015   Procedure: PACEMAKER CHANGE OUT;  Surgeon: Isaias Cowman, MD;  Location: [facility]  ORS;  Service: Cardiovascular;  Laterality: N/A;  . PENILE PROSTHESIS IMPLANT  1995   inflatable  . RETINAL DETACHMENT SURGERY Left   . TONSILLECTOMY      Prior to Admission medications   Medication Sig Start Date End Date Taking? Authorizing Provider  acetaminophen (TYLENOL) 500 MG tablet Take 1,000 mg 3 (three) times daily as needed by mouth for moderate pain.     [provider]  acetaminophen-codeine (TYLENOL #3) 300-30 MG tablet Take 1 tablet 2 (two) times daily as needed by mouth for moderate pain.  01/28/17   [provider]  apixaban (ELIQUIS) 2.5 MG TABS tablet Take 2.5 mg by mouth 2 (two) times daily.    [provider]  clotrimazole-betamethasone (LOTRISONE) cream APPLY TO AFFECTED AREAS TWICE DAILY AS NEEDED FOR RASH 01/22/17   [provider]  gabapentin (NEURONTIN) 300 MG capsule Take 300 mg 2 (two) times daily by mouth. Two capsules     [provider]  glipiZIDE (GLUCOTROL XL) 2.5 MG 24 hr tablet Take 2.5 mg by mouth daily with breakfast.    [provider]  levofloxacin (LEVAQUIN) 500 MG tablet Take 1 tablet (500 mg total) by mouth daily. 01/19/18   Fisher, Linden Dolin, PA-C  omeprazole (PRILOSEC) 20 MG capsule Take 20 mg by mouth 2 (two) times daily before a meal.  [provider]  oxyCODONE-acetaminophen (PERCOCET/ROXICET) 5-325 MG tablet Take 1 tablet by mouth every 4 (four) hours as needed for severe pain. 01/19/18   Fisher, Linden Dolin, PA-C  quinapril (ACCUPRIL) 10 MG tablet Take 10 mg by mouth daily.    [provider]  sertraline (ZOLOFT) 50 MG tablet Take 50 mg by mouth daily.    [provider]  simvastatin (ZOCOR) 20 MG tablet Take 20 mg by mouth daily at 6 PM.    [provider]    Allergies Sulfa antibiotics and Lipitor [atorvastatin]  Family History  Problem Relation Age of Onset  . Cancer Mother     Social History Social History   Tobacco Use  . Smoking status: Former  Research scientist (life sciences)  . Smokeless tobacco: Current User    Types: Chew  Substance Use Topics  . Alcohol use: No  . Drug use: No    Review of Systems  Constitutional: No fever/chills Eyes: No visual changes. ENT: No sore throat. Respiratory: Denies cough Genitourinary: Negative for dysuria. Musculoskeletal: Positive for back pain. Skin: Negative for rash.    ____________________________________________   PHYSICAL EXAM:  VITAL SIGNS: ED Triage Vitals  Enc Vitals Group     BP 01/19/18 1335 (!) 160/62     Pulse Rate 01/19/18 1335 65     Resp 01/19/18 1335 18     Temp 01/19/18 1335 (!) 97.3 F (36.3 C)     Temp Source 01/19/18 1335 Oral     SpO2 01/19/18 1335 95 %     Weight 01/19/18 1336 160 lb (72.6 kg)     Height 01/19/18 1336 5\' 6"  (1.676 m)     Head Circumference --      Peak Flow --      Pain Score 01/19/18 1336 8     Pain Loc --      Pain Edu? --      Excl. in [facility]? --     Constitutional: Alert and oriented. Well appearing and in no acute distress.  Patient is HOH Eyes: Conjunctivae are normal.  Head: Atraumatic. Nose: No congestion/rhinnorhea. Mouth/Throat: Mucous membranes are moist.   Neck:  supple no lymphadenopathy noted Cardiovascular: Normal rate, regular rhythm. Heart sounds are normal Respiratory: Normal respiratory effort.  No retractions, lungs c t a  Abd: soft nontender bs normal all 4 quad, no CVA tenderness noted, no pulsatile masses noted GU: deferred Musculoskeletal: FROM all extremities, warm and well perfused Neurologic:  Normal speech and language.  Skin:  Skin is warm, dry and intact. No rash noted. Psychiatric: Mood and affect are normal. Speech and behavior are normal.  ____________________________________________   LABS (all labs ordered are listed, but only abnormal results are displayed)  Labs Reviewed  URINALYSIS, COMPLETE (UACMP) WITH MICROSCOPIC - Abnormal; Notable for the following components:      Result Value   Color, Urine YELLOW  (*)    APPearance HAZY (*)    Leukocytes, UA MODERATE (*)    All other components within normal limits  COMPREHENSIVE METABOLIC PANEL - Abnormal; Notable for the following components:   Glucose, Bld 106 (*)    Creatinine, Ser 1.67 (*)    GFR calc non Af Amer 37 (*)    GFR calc Af Amer 42 (*)    All other components within normal limits  CBC WITH DIFFERENTIAL/PLATELET   ____________________________________________   ____________________________________________  RADIOLOGY  CT chest abdomen pelvis for dissecting aneurysm is negative for aneurysm.  ____________________________________________   PROCEDURES  Procedure(s) performed: Morphine 4 mg IV x3 was given to the patient.  Procedures    ____________________________________________   INITIAL IMPRESSION / ASSESSMENT AND PLAN / ED COURSE  Pertinent labs & imaging results that were available during my care of the patient were reviewed by me and considered in my medical decision making (see chart for details).   Patient is an 84 year old male presents emergency department complaining of a new onset of right-sided flank pain.  On physical exam patient appears well.  He does appear to be uncomfortable.  The spine is not tender.  Abdomen is nontender.  CT of the chest/abdomen/pelvis for dissecting aortic aneurysm was ordered.  This is negative for any aneurysm.  However it did show some questionable pneumonia in the lungs and a opacity that should be reevaluated by his primary care doctor.  All of this was conveyed to the patient and his son.  He was given Rocephin 1 g IV.  He was given a prescription for Levaquin 500 mg daily for 7 days.  He was given a prescription for Percocet 5/325 for pain as needed.  He is to follow-up with his regular doctor.  He ends some both state they understand.  Patient was discharged in stable condition.     As part of my medical decision making, I reviewed the following data within the [place] History obtained from family, Nursing notes reviewed and incorporated, Labs reviewed UA shows moderate leuks, CBC is normal, conference metabolic panel is basically normal but has decreased kidney function, Old chart reviewed, Radiograph reviewed CT of the chest/abdomen/pelvis for aneurysm is negative, Notes from prior ED visits and [place] Controlled Substance Database  ____________________________________________   FINAL CLINICAL IMPRESSION(S) / ED DIAGNOSES  Final diagnoses:  Acute flank pain  Bronchopneumonia  Urinary tract infection without hematuria, site unspecified      NEW MEDICATIONS STARTED DURING THIS VISIT:  Discharge Medication List as of 01/19/2018  5:06 PM    START taking these medications   Details  levofloxacin (LEVAQUIN) 500 MG tablet Take 1 tablet (500 mg total) by mouth daily., Starting Sun 01/19/2018, Normal    oxyCODONE-acetaminophen (PERCOCET/ROXICET) 5-325 MG tablet Take 1 tablet by mouth every 4 (four) hours as needed for severe pain., Starting Sun 01/19/2018, Normal         Note:  This document was prepared using Dragon voice recognition software and may include unintentional dictation errors.    Versie Starks, PA-C 01/19/18 1933    Lavonia Drafts, MD 01/20/18 [phone]

## 2018-01-19 NOTE — ED Triage Notes (Addendum)
Pt c/o low back pain that began last night. States he usually "has a lot of back pain". Denies injury, denies fall. Denies urinary symptoms. Denies hematuria.   A&O, in wheelchair. Family member with pt. HOH.

## 2018-01-22 DIAGNOSIS — N390 Urinary tract infection, site not specified: Secondary | ICD-10-CM | POA: Diagnosis not present

## 2018-01-22 DIAGNOSIS — N400 Enlarged prostate without lower urinary tract symptoms: Secondary | ICD-10-CM | POA: Diagnosis not present

## 2018-01-22 DIAGNOSIS — I129 Hypertensive chronic kidney disease with stage 1 through stage 4 chronic kidney disease, or unspecified chronic kidney disease: Secondary | ICD-10-CM | POA: Diagnosis not present

## 2018-01-27 ENCOUNTER — Other Ambulatory Visit: Payer: Self-pay | Admitting: Internal Medicine

## 2018-01-27 DIAGNOSIS — E1122 Type 2 diabetes mellitus with diabetic chronic kidney disease: Secondary | ICD-10-CM | POA: Diagnosis not present

## 2018-01-27 DIAGNOSIS — N183 Chronic kidney disease, stage 3 (moderate): Secondary | ICD-10-CM | POA: Diagnosis not present

## 2018-01-27 DIAGNOSIS — J181 Lobar pneumonia, unspecified organism: Principal | ICD-10-CM

## 2018-01-27 DIAGNOSIS — Z720 Tobacco use: Secondary | ICD-10-CM | POA: Diagnosis not present

## 2018-01-27 DIAGNOSIS — R109 Unspecified abdominal pain: Secondary | ICD-10-CM | POA: Diagnosis not present

## 2018-01-27 DIAGNOSIS — I10 Essential (primary) hypertension: Secondary | ICD-10-CM | POA: Diagnosis not present

## 2018-01-27 DIAGNOSIS — G252 Other specified forms of tremor: Secondary | ICD-10-CM | POA: Diagnosis not present

## 2018-01-27 DIAGNOSIS — J189 Pneumonia, unspecified organism: Secondary | ICD-10-CM

## 2018-01-27 DIAGNOSIS — I480 Paroxysmal atrial fibrillation: Secondary | ICD-10-CM | POA: Diagnosis not present

## 2018-02-17 DIAGNOSIS — M5136 Other intervertebral disc degeneration, lumbar region: Secondary | ICD-10-CM | POA: Diagnosis not present

## 2018-02-17 DIAGNOSIS — M5416 Radiculopathy, lumbar region: Secondary | ICD-10-CM | POA: Diagnosis not present

## 2018-02-17 DIAGNOSIS — M47816 Spondylosis without myelopathy or radiculopathy, lumbar region: Secondary | ICD-10-CM | POA: Diagnosis not present

## 2018-02-17 DIAGNOSIS — M48062 Spinal stenosis, lumbar region with neurogenic claudication: Secondary | ICD-10-CM | POA: Diagnosis not present

## 2018-02-25 DIAGNOSIS — N35919 Unspecified urethral stricture, male, unspecified site: Secondary | ICD-10-CM | POA: Diagnosis not present

## 2018-02-25 DIAGNOSIS — N401 Enlarged prostate with lower urinary tract symptoms: Secondary | ICD-10-CM | POA: Diagnosis not present

## 2018-02-25 DIAGNOSIS — N35911 Unspecified urethral stricture, male, meatal: Secondary | ICD-10-CM | POA: Diagnosis not present

## 2018-02-25 DIAGNOSIS — Z125 Encounter for screening for malignant neoplasm of prostate: Secondary | ICD-10-CM | POA: Diagnosis not present

## 2018-02-25 DIAGNOSIS — R351 Nocturia: Secondary | ICD-10-CM | POA: Diagnosis not present

## 2018-03-05 ENCOUNTER — Ambulatory Visit
Admission: RE | Admit: 2018-03-05 | Discharge: 2018-03-05 | Disposition: A | Discharge status: Home / Self Care | Payer: Medicare HMO | Source: Ambulatory Visit | Attending: Internal Medicine | Admitting: Internal Medicine

## 2018-03-05 DIAGNOSIS — K449 Diaphragmatic hernia without obstruction or gangrene: Secondary | ICD-10-CM | POA: Diagnosis not present

## 2018-03-05 DIAGNOSIS — J189 Pneumonia, unspecified organism: Secondary | ICD-10-CM

## 2018-03-05 DIAGNOSIS — J181 Lobar pneumonia, unspecified organism: Secondary | ICD-10-CM | POA: Diagnosis not present

## 2018-04-09 DIAGNOSIS — M48062 Spinal stenosis, lumbar region with neurogenic claudication: Secondary | ICD-10-CM | POA: Diagnosis not present

## 2018-04-09 DIAGNOSIS — M5416 Radiculopathy, lumbar region: Secondary | ICD-10-CM | POA: Diagnosis not present

## 2018-04-09 DIAGNOSIS — M5136 Other intervertebral disc degeneration, lumbar region: Secondary | ICD-10-CM | POA: Diagnosis not present

## 2018-04-09 DIAGNOSIS — M47816 Spondylosis without myelopathy or radiculopathy, lumbar region: Secondary | ICD-10-CM | POA: Diagnosis not present

## 2018-04-17 DIAGNOSIS — H35351 Cystoid macular degeneration, right eye: Secondary | ICD-10-CM | POA: Diagnosis not present

## 2018-04-17 DIAGNOSIS — H35033 Hypertensive retinopathy, bilateral: Secondary | ICD-10-CM | POA: Diagnosis not present

## 2018-04-17 DIAGNOSIS — H35433 Paving stone degeneration of retina, bilateral: Secondary | ICD-10-CM | POA: Diagnosis not present

## 2018-04-17 DIAGNOSIS — E113293 Type 2 diabetes mellitus with mild nonproliferative diabetic retinopathy without macular edema, bilateral: Secondary | ICD-10-CM | POA: Diagnosis not present

## 2018-04-23 DIAGNOSIS — N183 Chronic kidney disease, stage 3 (moderate): Secondary | ICD-10-CM | POA: Diagnosis not present

## 2018-04-23 DIAGNOSIS — R319 Hematuria, unspecified: Secondary | ICD-10-CM | POA: Diagnosis not present

## 2018-04-23 DIAGNOSIS — E1122 Type 2 diabetes mellitus with diabetic chronic kidney disease: Secondary | ICD-10-CM | POA: Diagnosis not present

## 2018-04-23 DIAGNOSIS — I129 Hypertensive chronic kidney disease with stage 1 through stage 4 chronic kidney disease, or unspecified chronic kidney disease: Secondary | ICD-10-CM | POA: Diagnosis not present

## 2018-04-23 DIAGNOSIS — R809 Proteinuria, unspecified: Secondary | ICD-10-CM | POA: Diagnosis not present

## 2018-05-19 DIAGNOSIS — I10 Essential (primary) hypertension: Secondary | ICD-10-CM | POA: Diagnosis not present

## 2018-05-19 DIAGNOSIS — F411 Generalized anxiety disorder: Secondary | ICD-10-CM | POA: Diagnosis not present

## 2018-05-19 DIAGNOSIS — N183 Chronic kidney disease, stage 3 (moderate): Secondary | ICD-10-CM | POA: Diagnosis not present

## 2018-05-19 DIAGNOSIS — M48062 Spinal stenosis, lumbar region with neurogenic claudication: Secondary | ICD-10-CM | POA: Diagnosis not present

## 2018-05-19 DIAGNOSIS — Z720 Tobacco use: Secondary | ICD-10-CM | POA: Diagnosis not present

## 2018-05-19 DIAGNOSIS — I480 Paroxysmal atrial fibrillation: Secondary | ICD-10-CM | POA: Diagnosis not present

## 2018-05-19 DIAGNOSIS — E1122 Type 2 diabetes mellitus with diabetic chronic kidney disease: Secondary | ICD-10-CM | POA: Diagnosis not present

## 2018-05-19 DIAGNOSIS — R251 Tremor, unspecified: Secondary | ICD-10-CM | POA: Diagnosis not present

## 2018-05-19 DIAGNOSIS — Z950 Presence of cardiac pacemaker: Secondary | ICD-10-CM | POA: Diagnosis not present

## 2018-05-26 DIAGNOSIS — N183 Chronic kidney disease, stage 3 (moderate): Secondary | ICD-10-CM | POA: Diagnosis not present

## 2018-05-26 DIAGNOSIS — I10 Essential (primary) hypertension: Secondary | ICD-10-CM | POA: Diagnosis not present

## 2018-05-26 DIAGNOSIS — F411 Generalized anxiety disorder: Secondary | ICD-10-CM | POA: Diagnosis not present

## 2018-05-26 DIAGNOSIS — Z720 Tobacco use: Secondary | ICD-10-CM | POA: Diagnosis not present

## 2018-05-26 DIAGNOSIS — I480 Paroxysmal atrial fibrillation: Secondary | ICD-10-CM | POA: Diagnosis not present

## 2018-05-26 DIAGNOSIS — Z950 Presence of cardiac pacemaker: Secondary | ICD-10-CM | POA: Diagnosis not present

## 2018-05-26 DIAGNOSIS — Z0000 Encounter for general adult medical examination without abnormal findings: Secondary | ICD-10-CM | POA: Diagnosis not present

## 2018-05-26 DIAGNOSIS — R251 Tremor, unspecified: Secondary | ICD-10-CM | POA: Diagnosis not present

## 2018-05-26 DIAGNOSIS — E1122 Type 2 diabetes mellitus with diabetic chronic kidney disease: Secondary | ICD-10-CM | POA: Diagnosis not present

## 2018-08-06 DIAGNOSIS — M5416 Radiculopathy, lumbar region: Secondary | ICD-10-CM | POA: Diagnosis not present

## 2018-08-06 DIAGNOSIS — M47816 Spondylosis without myelopathy or radiculopathy, lumbar region: Secondary | ICD-10-CM | POA: Diagnosis not present

## 2018-08-06 DIAGNOSIS — M48062 Spinal stenosis, lumbar region with neurogenic claudication: Secondary | ICD-10-CM | POA: Diagnosis not present

## 2018-08-06 DIAGNOSIS — M5136 Other intervertebral disc degeneration, lumbar region: Secondary | ICD-10-CM | POA: Diagnosis not present

## 2018-09-18 DIAGNOSIS — Z720 Tobacco use: Secondary | ICD-10-CM | POA: Diagnosis not present

## 2018-09-18 DIAGNOSIS — Z125 Encounter for screening for malignant neoplasm of prostate: Secondary | ICD-10-CM | POA: Diagnosis not present

## 2018-09-18 DIAGNOSIS — I10 Essential (primary) hypertension: Secondary | ICD-10-CM | POA: Diagnosis not present

## 2018-09-18 DIAGNOSIS — E1122 Type 2 diabetes mellitus with diabetic chronic kidney disease: Secondary | ICD-10-CM | POA: Diagnosis not present

## 2018-09-18 DIAGNOSIS — Z950 Presence of cardiac pacemaker: Secondary | ICD-10-CM | POA: Diagnosis not present

## 2018-09-18 DIAGNOSIS — F411 Generalized anxiety disorder: Secondary | ICD-10-CM | POA: Diagnosis not present

## 2018-09-18 DIAGNOSIS — R251 Tremor, unspecified: Secondary | ICD-10-CM | POA: Diagnosis not present

## 2018-09-18 DIAGNOSIS — Z0000 Encounter for general adult medical examination without abnormal findings: Secondary | ICD-10-CM | POA: Diagnosis not present

## 2018-09-18 DIAGNOSIS — N183 Chronic kidney disease, stage 3 (moderate): Secondary | ICD-10-CM | POA: Diagnosis not present

## 2018-09-18 DIAGNOSIS — I480 Paroxysmal atrial fibrillation: Secondary | ICD-10-CM | POA: Diagnosis not present

## 2018-09-25 DIAGNOSIS — F411 Generalized anxiety disorder: Secondary | ICD-10-CM | POA: Diagnosis not present

## 2018-09-25 DIAGNOSIS — Z950 Presence of cardiac pacemaker: Secondary | ICD-10-CM | POA: Diagnosis not present

## 2018-09-25 DIAGNOSIS — R251 Tremor, unspecified: Secondary | ICD-10-CM | POA: Diagnosis not present

## 2018-09-25 DIAGNOSIS — E7800 Pure hypercholesterolemia, unspecified: Secondary | ICD-10-CM | POA: Diagnosis not present

## 2018-09-25 DIAGNOSIS — I480 Paroxysmal atrial fibrillation: Secondary | ICD-10-CM | POA: Diagnosis not present

## 2018-09-25 DIAGNOSIS — E1122 Type 2 diabetes mellitus with diabetic chronic kidney disease: Secondary | ICD-10-CM | POA: Diagnosis not present

## 2018-09-25 DIAGNOSIS — I10 Essential (primary) hypertension: Secondary | ICD-10-CM | POA: Diagnosis not present

## 2018-09-25 DIAGNOSIS — N183 Chronic kidney disease, stage 3 (moderate): Secondary | ICD-10-CM | POA: Diagnosis not present

## 2018-09-25 DIAGNOSIS — Z720 Tobacco use: Secondary | ICD-10-CM | POA: Diagnosis not present

## 2018-10-23 DIAGNOSIS — I10 Essential (primary) hypertension: Secondary | ICD-10-CM | POA: Diagnosis not present

## 2018-10-23 DIAGNOSIS — E1122 Type 2 diabetes mellitus with diabetic chronic kidney disease: Secondary | ICD-10-CM | POA: Diagnosis not present

## 2018-10-23 DIAGNOSIS — N2581 Secondary hyperparathyroidism of renal origin: Secondary | ICD-10-CM | POA: Diagnosis not present

## 2018-10-23 DIAGNOSIS — R809 Proteinuria, unspecified: Secondary | ICD-10-CM | POA: Diagnosis not present

## 2018-10-23 DIAGNOSIS — N183 Chronic kidney disease, stage 3 (moderate): Secondary | ICD-10-CM | POA: Diagnosis not present

## 2018-10-23 DIAGNOSIS — R319 Hematuria, unspecified: Secondary | ICD-10-CM | POA: Diagnosis not present

## 2018-10-23 DIAGNOSIS — E785 Hyperlipidemia, unspecified: Secondary | ICD-10-CM | POA: Diagnosis not present

## 2018-11-05 DIAGNOSIS — M48062 Spinal stenosis, lumbar region with neurogenic claudication: Secondary | ICD-10-CM | POA: Diagnosis not present

## 2018-11-05 DIAGNOSIS — M5136 Other intervertebral disc degeneration, lumbar region: Secondary | ICD-10-CM | POA: Diagnosis not present

## 2018-11-05 DIAGNOSIS — M5416 Radiculopathy, lumbar region: Secondary | ICD-10-CM | POA: Diagnosis not present

## 2018-11-05 DIAGNOSIS — M47816 Spondylosis without myelopathy or radiculopathy, lumbar region: Secondary | ICD-10-CM | POA: Diagnosis not present

## 2018-11-11 DIAGNOSIS — N399 Disorder of urinary system, unspecified: Secondary | ICD-10-CM | POA: Diagnosis not present

## 2018-11-11 DIAGNOSIS — N390 Urinary tract infection, site not specified: Secondary | ICD-10-CM | POA: Diagnosis not present

## 2018-11-11 DIAGNOSIS — N35911 Unspecified urethral stricture, male, meatal: Secondary | ICD-10-CM | POA: Diagnosis not present

## 2018-11-11 DIAGNOSIS — N23 Unspecified renal colic: Secondary | ICD-10-CM | POA: Diagnosis not present

## 2018-11-11 DIAGNOSIS — N401 Enlarged prostate with lower urinary tract symptoms: Secondary | ICD-10-CM | POA: Diagnosis not present

## 2018-11-12 ENCOUNTER — Other Ambulatory Visit: Payer: Self-pay

## 2018-11-15 IMAGING — CT CT L SPINE W/O CM
1 of 8 series · 5 of 14 positions shown, 7 images · non-contrast
Comparison: 02/25/2013

CLINICAL DATA: Low back pain radiating down both legs to the toes
for 1 week.

EXAM:
CT LUMBAR SPINE WITHOUT CONTRAST
TECHNIQUE: Multidetector CT imaging of the lumbar spine was performed without
intravenous contrast administration. Multiplanar CT image
reconstructions were also generated.

[Series 4: l spine soft · axial · 0.37mm/px · z∈[-840,-666]mm · 5 of 131 slices shown, 7 images]
[im 22/131  soft-tissue]
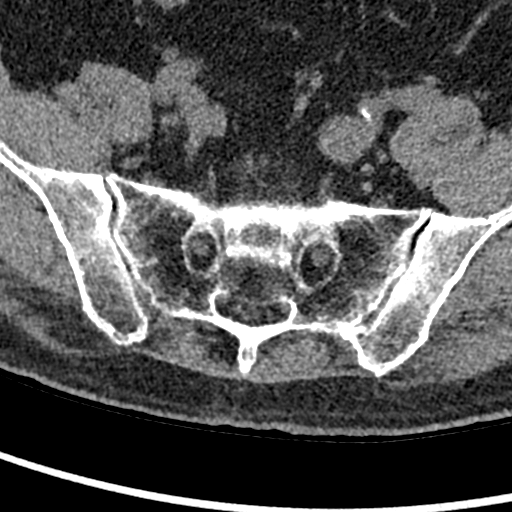
[im 22/131  bone]
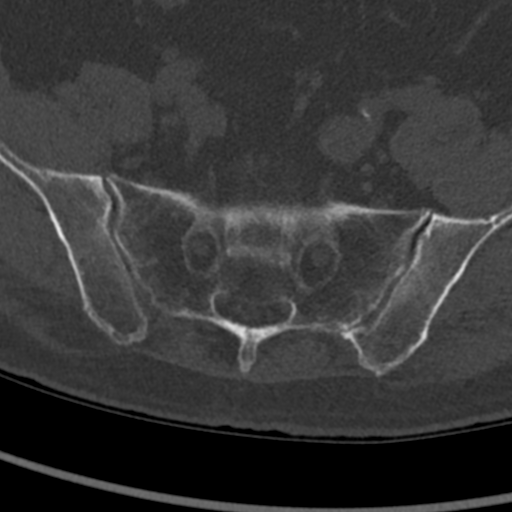
[im 44/131  bone]
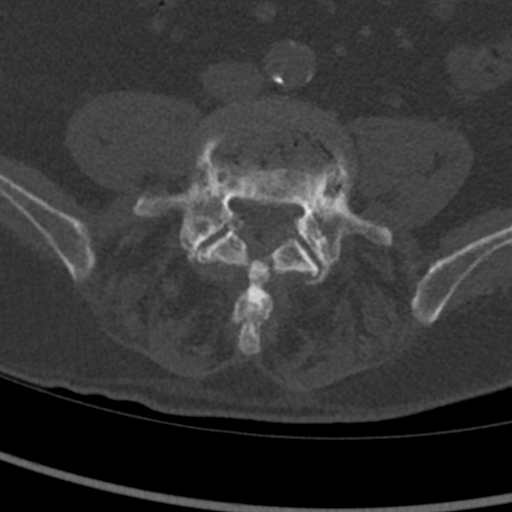
[im 66/131  bone]
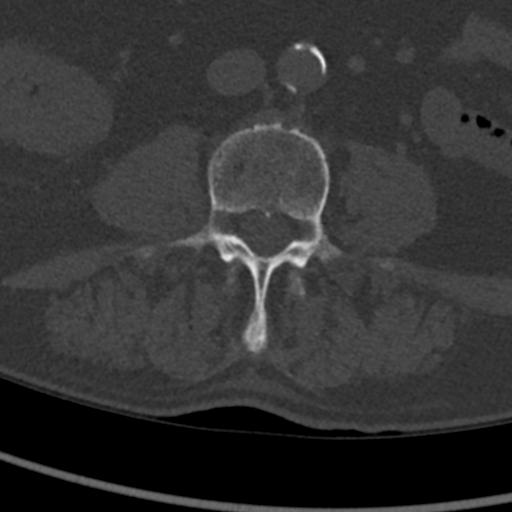
[im 87/131  bone]
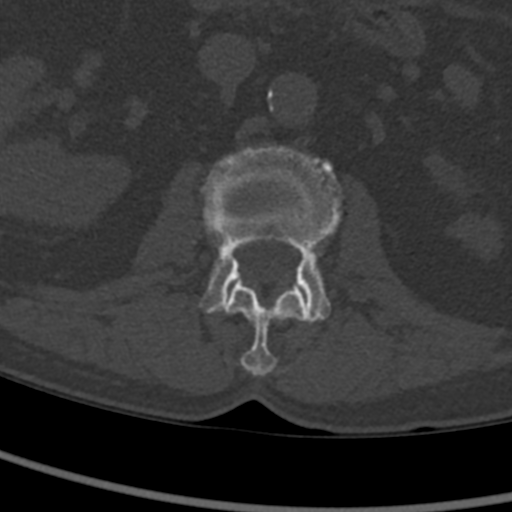
[im 109/131  soft-tissue]
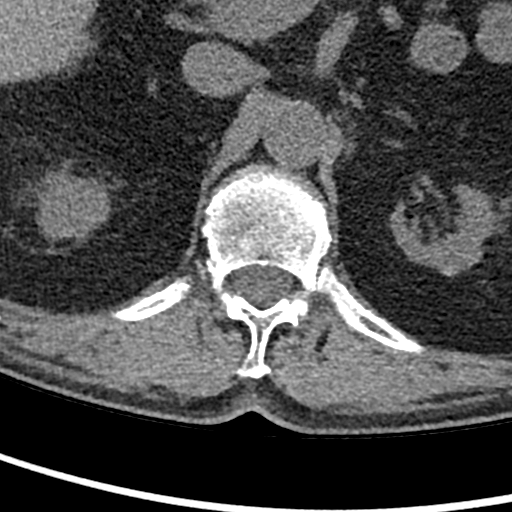
[im 109/131  bone]
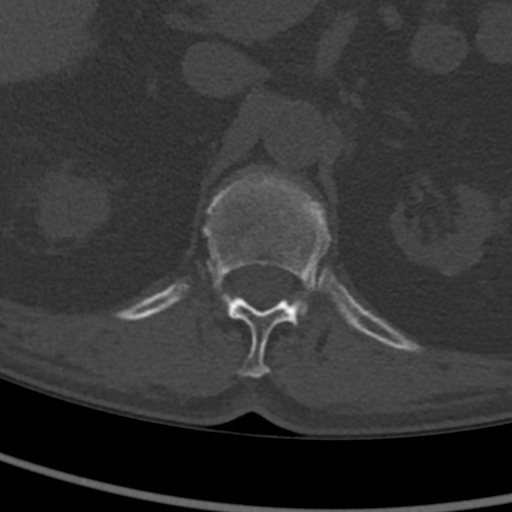

[5 of 14 positions shown; findings below may reference images not displayed]

FINDINGS: Segmentation: 5 lumbar type vertebrae.

Alignment: Grade 1 anterolisthesis at L4-5, related to chronic pars
defects and severe degeneration. Slight retrolisthesis at L2-3.

Vertebrae: Remote L2 superior endplate fracture. No acute fracture.
Endplate irregularities appear chronic and degenerative. No
suspected discitis. No aggressive bone lesions seen. Chronic
bilateral L5 pars defects.

Paraspinal and other soft tissues: Severe left renal atrophy.
Atherosclerosis of the aorta and branches.

Disc levels:

T12- L1: Mild facet spurring.  No evidence of impingement

L1-L2: Mild facet spurring. Mild endplate ridging. No evidence of
impingement

L2-L3: Advanced degenerative disc narrowing with endplate ridging
and irregularity. Facet arthropathy and ligamentum flavum
thickening. Mild spinal stenosis. Bilateral subarticular recess
narrowing. Patent foramina

L3-L4: Advanced degenerative disc narrowing with endplate ridging
and irregularity. Degenerative disc narrowing and fissuring with
vacuum phenomenon has progressed. Facet arthropathy with ligamentum
flavum thickening. Bilateral subarticular recess narrowing and mild
spinal stenosis. Patent foramina

L4-L5: Advanced degenerative disc disease with bulging and endplate
ridging. Facet arthropathy with hypertrophy and ligamentum flavum
thickening. Likely advanced spinal stenosis. Bilateral subarticular
recess narrowing. Bilateral moderate foraminal stenosis.

L5-S1:Chronic L5 pars defects with anterolisthesis and severe disc
degeneration. Spinal stenosis is mild. Bilateral foraminal
distortion and possible L5 distortion.
IMPRESSION: 1. No acute finding.
2. Advanced disc degeneration from L2-3 to L5-S1. Generalized facet
arthropathy.
3. L5 chronic pars defects with grade 1 anterolisthesis.
4. L4-5 spinal stenosis that moderate to advanced. Bilateral
moderate foraminal narrowing.
5. L5-S1 bilateral foraminal stenosis with L5 impingement.
6. L2-3 and L3-4 mild spinal stenosis and bilateral subarticular
recess narrowing.
7. Remote L2 superior endplate fracture.

## 2018-11-17 DIAGNOSIS — E875 Hyperkalemia: Secondary | ICD-10-CM | POA: Diagnosis not present

## 2018-11-17 DIAGNOSIS — I129 Hypertensive chronic kidney disease with stage 1 through stage 4 chronic kidney disease, or unspecified chronic kidney disease: Secondary | ICD-10-CM | POA: Diagnosis not present

## 2018-11-17 DIAGNOSIS — N184 Chronic kidney disease, stage 4 (severe): Secondary | ICD-10-CM | POA: Diagnosis not present

## 2018-11-17 DIAGNOSIS — R809 Proteinuria, unspecified: Secondary | ICD-10-CM | POA: Diagnosis not present

## 2018-11-17 DIAGNOSIS — R319 Hematuria, unspecified: Secondary | ICD-10-CM | POA: Diagnosis not present

## 2018-11-18 DIAGNOSIS — R3121 Asymptomatic microscopic hematuria: Secondary | ICD-10-CM | POA: Diagnosis not present

## 2018-11-18 DIAGNOSIS — N35911 Unspecified urethral stricture, male, meatal: Secondary | ICD-10-CM | POA: Diagnosis not present

## 2018-11-18 DIAGNOSIS — N3021 Other chronic cystitis with hematuria: Secondary | ICD-10-CM | POA: Diagnosis not present

## 2018-11-18 DIAGNOSIS — N401 Enlarged prostate with lower urinary tract symptoms: Secondary | ICD-10-CM | POA: Diagnosis not present

## 2018-11-20 DIAGNOSIS — I10 Essential (primary) hypertension: Secondary | ICD-10-CM | POA: Diagnosis not present

## 2018-11-20 DIAGNOSIS — Z950 Presence of cardiac pacemaker: Secondary | ICD-10-CM | POA: Diagnosis not present

## 2018-11-20 DIAGNOSIS — E7800 Pure hypercholesterolemia, unspecified: Secondary | ICD-10-CM | POA: Diagnosis not present

## 2018-11-20 DIAGNOSIS — I495 Sick sinus syndrome: Secondary | ICD-10-CM | POA: Diagnosis not present

## 2019-01-16 DIAGNOSIS — N35919 Unspecified urethral stricture, male, unspecified site: Secondary | ICD-10-CM | POA: Diagnosis not present

## 2019-01-16 DIAGNOSIS — E1122 Type 2 diabetes mellitus with diabetic chronic kidney disease: Secondary | ICD-10-CM | POA: Diagnosis not present

## 2019-01-16 DIAGNOSIS — I480 Paroxysmal atrial fibrillation: Secondary | ICD-10-CM | POA: Diagnosis not present

## 2019-01-16 DIAGNOSIS — I10 Essential (primary) hypertension: Secondary | ICD-10-CM | POA: Diagnosis not present

## 2019-01-16 DIAGNOSIS — Z720 Tobacco use: Secondary | ICD-10-CM | POA: Diagnosis not present

## 2019-01-16 DIAGNOSIS — N183 Chronic kidney disease, stage 3 unspecified: Secondary | ICD-10-CM | POA: Diagnosis not present

## 2019-01-16 DIAGNOSIS — R8290 Unspecified abnormal findings in urine: Secondary | ICD-10-CM | POA: Diagnosis not present

## 2019-01-16 DIAGNOSIS — R300 Dysuria: Secondary | ICD-10-CM | POA: Diagnosis not present

## 2019-01-16 DIAGNOSIS — R251 Tremor, unspecified: Secondary | ICD-10-CM | POA: Diagnosis not present

## 2019-01-16 DIAGNOSIS — Z950 Presence of cardiac pacemaker: Secondary | ICD-10-CM | POA: Diagnosis not present

## 2019-01-16 DIAGNOSIS — E7800 Pure hypercholesterolemia, unspecified: Secondary | ICD-10-CM | POA: Diagnosis not present

## 2019-01-16 DIAGNOSIS — N390 Urinary tract infection, site not specified: Secondary | ICD-10-CM | POA: Diagnosis not present

## 2019-01-16 DIAGNOSIS — N189 Chronic kidney disease, unspecified: Secondary | ICD-10-CM | POA: Diagnosis not present

## 2019-01-16 DIAGNOSIS — F411 Generalized anxiety disorder: Secondary | ICD-10-CM | POA: Diagnosis not present

## 2019-01-27 DIAGNOSIS — I480 Paroxysmal atrial fibrillation: Secondary | ICD-10-CM | POA: Diagnosis not present

## 2019-01-27 DIAGNOSIS — R251 Tremor, unspecified: Secondary | ICD-10-CM | POA: Diagnosis not present

## 2019-01-27 DIAGNOSIS — N1832 Chronic kidney disease, stage 3b: Secondary | ICD-10-CM | POA: Diagnosis not present

## 2019-01-27 DIAGNOSIS — F411 Generalized anxiety disorder: Secondary | ICD-10-CM | POA: Diagnosis not present

## 2019-01-27 DIAGNOSIS — E1122 Type 2 diabetes mellitus with diabetic chronic kidney disease: Secondary | ICD-10-CM | POA: Diagnosis not present

## 2019-01-27 DIAGNOSIS — Z720 Tobacco use: Secondary | ICD-10-CM | POA: Diagnosis not present

## 2019-01-27 DIAGNOSIS — M48062 Spinal stenosis, lumbar region with neurogenic claudication: Secondary | ICD-10-CM | POA: Diagnosis not present

## 2019-01-27 DIAGNOSIS — E7800 Pure hypercholesterolemia, unspecified: Secondary | ICD-10-CM | POA: Diagnosis not present

## 2019-01-27 DIAGNOSIS — I129 Hypertensive chronic kidney disease with stage 1 through stage 4 chronic kidney disease, or unspecified chronic kidney disease: Secondary | ICD-10-CM | POA: Diagnosis not present

## 2019-01-27 DIAGNOSIS — E1121 Type 2 diabetes mellitus with diabetic nephropathy: Secondary | ICD-10-CM | POA: Diagnosis not present

## 2019-02-04 ENCOUNTER — Encounter: Payer: Self-pay | Admitting: Emergency Medicine

## 2019-02-04 ENCOUNTER — Inpatient Hospital Stay
Admission: EM | Admit: 2019-02-04 | Discharge: 2019-02-07 | DRG: 871 | Disposition: A | Discharge status: Home / Self Care | Payer: Medicare HMO | Attending: Internal Medicine | Admitting: Internal Medicine

## 2019-02-04 ENCOUNTER — Emergency Department: Payer: Medicare HMO

## 2019-02-04 ENCOUNTER — Other Ambulatory Visit: Payer: Self-pay

## 2019-02-04 DIAGNOSIS — F329 Major depressive disorder, single episode, unspecified: Secondary | ICD-10-CM | POA: Diagnosis present

## 2019-02-04 DIAGNOSIS — Z7901 Long term (current) use of anticoagulants: Secondary | ICD-10-CM

## 2019-02-04 DIAGNOSIS — N1831 Chronic kidney disease, stage 3a: Secondary | ICD-10-CM | POA: Diagnosis present

## 2019-02-04 DIAGNOSIS — Z7984 Long term (current) use of oral hypoglycemic drugs: Secondary | ICD-10-CM | POA: Diagnosis not present

## 2019-02-04 DIAGNOSIS — G250 Essential tremor: Secondary | ICD-10-CM | POA: Diagnosis present

## 2019-02-04 DIAGNOSIS — J441 Chronic obstructive pulmonary disease with (acute) exacerbation: Secondary | ICD-10-CM | POA: Diagnosis present

## 2019-02-04 DIAGNOSIS — I129 Hypertensive chronic kidney disease with stage 1 through stage 4 chronic kidney disease, or unspecified chronic kidney disease: Secondary | ICD-10-CM | POA: Diagnosis present

## 2019-02-04 DIAGNOSIS — F419 Anxiety disorder, unspecified: Secondary | ICD-10-CM | POA: Diagnosis not present

## 2019-02-04 DIAGNOSIS — I482 Chronic atrial fibrillation, unspecified: Secondary | ICD-10-CM | POA: Diagnosis present

## 2019-02-04 DIAGNOSIS — R05 Cough: Secondary | ICD-10-CM

## 2019-02-04 DIAGNOSIS — Z87442 Personal history of urinary calculi: Secondary | ICD-10-CM | POA: Diagnosis not present

## 2019-02-04 DIAGNOSIS — M069 Rheumatoid arthritis, unspecified: Secondary | ICD-10-CM | POA: Diagnosis present

## 2019-02-04 DIAGNOSIS — F32A Depression, unspecified: Secondary | ICD-10-CM

## 2019-02-04 DIAGNOSIS — F17220 Nicotine dependence, chewing tobacco, uncomplicated: Secondary | ICD-10-CM | POA: Diagnosis present

## 2019-02-04 DIAGNOSIS — I429 Cardiomyopathy, unspecified: Secondary | ICD-10-CM | POA: Diagnosis not present

## 2019-02-04 DIAGNOSIS — E1122 Type 2 diabetes mellitus with diabetic chronic kidney disease: Secondary | ICD-10-CM | POA: Diagnosis present

## 2019-02-04 DIAGNOSIS — E1140 Type 2 diabetes mellitus with diabetic neuropathy, unspecified: Secondary | ICD-10-CM | POA: Diagnosis present

## 2019-02-04 DIAGNOSIS — J440 Chronic obstructive pulmonary disease with acute lower respiratory infection: Secondary | ICD-10-CM | POA: Diagnosis not present

## 2019-02-04 DIAGNOSIS — Z882 Allergy status to sulfonamides status: Secondary | ICD-10-CM

## 2019-02-04 DIAGNOSIS — Z79899 Other long term (current) drug therapy: Secondary | ICD-10-CM | POA: Diagnosis not present

## 2019-02-04 DIAGNOSIS — E1121 Type 2 diabetes mellitus with diabetic nephropathy: Secondary | ICD-10-CM

## 2019-02-04 DIAGNOSIS — R059 Cough, unspecified: Secondary | ICD-10-CM

## 2019-02-04 DIAGNOSIS — A419 Sepsis, unspecified organism: Principal | ICD-10-CM | POA: Diagnosis present

## 2019-02-04 DIAGNOSIS — E1165 Type 2 diabetes mellitus with hyperglycemia: Secondary | ICD-10-CM | POA: Diagnosis not present

## 2019-02-04 DIAGNOSIS — R251 Tremor, unspecified: Secondary | ICD-10-CM

## 2019-02-04 DIAGNOSIS — Z66 Do not resuscitate: Secondary | ICD-10-CM | POA: Diagnosis present

## 2019-02-04 DIAGNOSIS — Z888 Allergy status to other drugs, medicaments and biological substances status: Secondary | ICD-10-CM

## 2019-02-04 DIAGNOSIS — I4891 Unspecified atrial fibrillation: Secondary | ICD-10-CM

## 2019-02-04 DIAGNOSIS — J189 Pneumonia, unspecified organism: Secondary | ICD-10-CM | POA: Diagnosis present

## 2019-02-04 DIAGNOSIS — R509 Fever, unspecified: Secondary | ICD-10-CM | POA: Diagnosis not present

## 2019-02-04 DIAGNOSIS — I959 Hypotension, unspecified: Secondary | ICD-10-CM | POA: Diagnosis not present

## 2019-02-04 DIAGNOSIS — R0602 Shortness of breath: Secondary | ICD-10-CM | POA: Diagnosis not present

## 2019-02-04 DIAGNOSIS — Z20828 Contact with and (suspected) exposure to other viral communicable diseases: Secondary | ICD-10-CM | POA: Diagnosis present

## 2019-02-04 DIAGNOSIS — E785 Hyperlipidemia, unspecified: Secondary | ICD-10-CM | POA: Diagnosis present

## 2019-02-04 LAB — COMPREHENSIVE METABOLIC PANEL
ALT: 9 U/L (ref 0–44)
AST: 14 U/L — ABNORMAL LOW (ref 15–41)
Albumin: 3.8 g/dL (ref 3.5–5.0)
Alkaline Phosphatase: 49 U/L (ref 38–126)
Anion gap: 11 (ref 5–15)
BUN: 25 mg/dL — ABNORMAL HIGH (ref 8–23)
CO2: 20 mmol/L — ABNORMAL LOW (ref 22–32)
Calcium: 8.9 mg/dL (ref 8.9–10.3)
Chloride: 109 mmol/L (ref 98–111)
Creatinine, Ser: 1.62 mg/dL — ABNORMAL HIGH (ref 0.61–1.24)
GFR calc Af Amer: 45 mL/min — ABNORMAL LOW (ref >60)
GFR calc non Af Amer: 39 mL/min — ABNORMAL LOW (ref >60)
Glucose, Bld: 134 mg/dL — ABNORMAL HIGH (ref 70–99)
Potassium: 4.7 mmol/L (ref 3.5–5.1)
Sodium: 140 mmol/L (ref 135–145)
Total Bilirubin: 0.5 mg/dL (ref 0.3–1.2)
Total Protein: 7.5 g/dL (ref 6.5–8.1)

## 2019-02-04 LAB — GLUCOSE, CAPILLARY
Glucose-Capillary: 250 mg/dL — ABNORMAL HIGH (ref 70–99)
Glucose-Capillary: 202 mg/dL — ABNORMAL HIGH (ref 70–99)

## 2019-02-04 LAB — SARS CORONAVIRUS 2 (TAT 6-24 HRS): SARS Coronavirus 2: NEGATIVE

## 2019-02-04 LAB — CBC WITH DIFFERENTIAL/PLATELET
Abs Immature Granulocytes: 0.04 10*3/uL (ref 0.00–0.07)
Basophils Absolute: 0 10*3/uL (ref 0.0–0.1)
Basophils Relative: 0 %
Eosinophils Absolute: 0.2 10*3/uL (ref 0.0–0.5)
Eosinophils Relative: 1 %
HCT: 41.2 % (ref 39.0–52.0)
Hemoglobin: 13.5 g/dL (ref 13.0–17.0)
Immature Granulocytes: 0 %
Lymphocytes Relative: 4 %
Lymphs Abs: 0.5 10*3/uL — ABNORMAL LOW (ref 0.7–4.0)
MCH: 29.6 pg (ref 26.0–34.0)
MCHC: 32.8 g/dL (ref 30.0–36.0)
MCV: 90.4 fL (ref 80.0–100.0)
Monocytes Absolute: 0.8 10*3/uL (ref 0.1–1.0)
Monocytes Relative: 7 %
Neutro Abs: 10.9 10*3/uL — ABNORMAL HIGH (ref 1.7–7.7)
Neutrophils Relative %: 88 %
Platelets: 223 10*3/uL (ref 150–400)
RBC: 4.56 MIL/uL (ref 4.22–5.81)
RDW: 13.9 % (ref 11.5–15.5)
WBC: 12.5 10*3/uL — ABNORMAL HIGH (ref 4.0–10.5)
nRBC: 0 % (ref 0.0–0.2)

## 2019-02-04 LAB — HEMOGLOBIN A1C
Hgb A1c MFr Bld: 6.9 % — ABNORMAL HIGH (ref 4.8–5.6)
Mean Plasma Glucose: 151.33 mg/dL

## 2019-02-04 LAB — URINALYSIS, ROUTINE W REFLEX MICROSCOPIC
Bilirubin Urine: NEGATIVE
Glucose, UA: NEGATIVE mg/dL
Hgb urine dipstick: NEGATIVE
Ketones, ur: NEGATIVE mg/dL
Leukocytes,Ua: NEGATIVE
Nitrite: NEGATIVE
Protein, ur: NEGATIVE mg/dL
Specific Gravity, Urine: 1.014 (ref 1.005–1.030)
pH: 5 (ref 5.0–8.0)

## 2019-02-04 LAB — APTT: aPTT: 34 seconds (ref 24–36)

## 2019-02-04 LAB — TROPONIN I (HIGH SENSITIVITY)
Troponin I (High Sensitivity): 23 ng/L — ABNORMAL HIGH (ref <18)
Troponin I (High Sensitivity): 21 ng/L — ABNORMAL HIGH (ref <18)

## 2019-02-04 LAB — PROCALCITONIN: Procalcitonin: 0.27 ng/mL

## 2019-02-04 LAB — PROTIME-INR
INR: 1.2 (ref 0.8–1.2)
Prothrombin Time: 14.7 seconds (ref 11.4–15.2)

## 2019-02-04 LAB — MRSA PCR SCREENING: MRSA by PCR: POSITIVE — AB

## 2019-02-04 LAB — LACTIC ACID, PLASMA: Lactic Acid, Venous: 1.7 mmol/L (ref 0.5–1.9)

## 2019-02-04 MED ORDER — SERTRALINE HCL 50 MG PO TABS
50.0000 mg | ORAL_TABLET | Freq: Every day | ORAL | Status: DC
  Administered 2019-02-04 – 2019-02-07 (×4): 50 mg via ORAL
  Filled 2019-02-04 (×4): qty 1

## 2019-02-04 MED ORDER — INSULIN ASPART 100 UNIT/ML ~~LOC~~ SOLN
0.0000 [IU] | Freq: Every day | SUBCUTANEOUS | Status: DC
  Administered 2019-02-04: 2 [IU] via SUBCUTANEOUS
  Filled 2019-02-04: qty 1

## 2019-02-04 MED ORDER — IPRATROPIUM-ALBUTEROL 0.5-2.5 (3) MG/3ML IN SOLN
3.0000 mL | RESPIRATORY_TRACT | Status: DC | PRN
  Administered 2019-02-04: 3 mL via RESPIRATORY_TRACT
  Filled 2019-02-04: qty 3

## 2019-02-04 MED ORDER — SODIUM CHLORIDE 0.9 % IV SOLN
2.0000 g | INTRAVENOUS | Status: DC
  Administered 2019-02-04 – 2019-02-06 (×3): 2 g via INTRAVENOUS
  Filled 2019-02-04: qty 20
  Filled 2019-02-04 (×2): qty 2
  Filled 2019-02-04 (×2): qty 20

## 2019-02-04 MED ORDER — PANTOPRAZOLE SODIUM 40 MG PO TBEC
40.0000 mg | DELAYED_RELEASE_TABLET | Freq: Every day | ORAL | Status: DC
  Administered 2019-02-04 – 2019-02-07 (×4): 40 mg via ORAL
  Filled 2019-02-04 (×4): qty 1

## 2019-02-04 MED ORDER — ACETAMINOPHEN 325 MG PO TABS
650.0000 mg | ORAL_TABLET | Freq: Four times a day (QID) | ORAL | Status: DC | PRN
  Filled 2019-02-04: qty 2

## 2019-02-04 MED ORDER — GABAPENTIN 300 MG PO CAPS
300.0000 mg | ORAL_CAPSULE | Freq: Two times a day (BID) | ORAL | Status: DC
  Administered 2019-02-04 – 2019-02-07 (×6): 300 mg via ORAL
  Filled 2019-02-04 (×7): qty 1

## 2019-02-04 MED ORDER — ACETAMINOPHEN 500 MG PO TABS
1000.0000 mg | ORAL_TABLET | Freq: Once | ORAL | Status: AC
  Administered 2019-02-04: 1000 mg via ORAL
  Filled 2019-02-04: qty 2

## 2019-02-04 MED ORDER — ACETAMINOPHEN-CODEINE #3 300-30 MG PO TABS
1.0000 | ORAL_TABLET | Freq: Two times a day (BID) | ORAL | Status: DC | PRN
  Filled 2019-02-04: qty 1

## 2019-02-04 MED ORDER — ACETAMINOPHEN 650 MG RE SUPP: 650.0000 mg | Freq: Four times a day (QID) | RECTAL | Status: DC | PRN

## 2019-02-04 MED ORDER — OXYCODONE-ACETAMINOPHEN 5-325 MG PO TABS: 1.0000 | ORAL_TABLET | ORAL | Status: DC | PRN

## 2019-02-04 MED ORDER — LACTATED RINGERS IV SOLN
INTRAVENOUS | Status: DC
  Administered 2019-02-04: 16:00:00 via INTRAVENOUS

## 2019-02-04 MED ORDER — METHYLPREDNISOLONE SODIUM SUCC 40 MG IJ SOLR
40.0000 mg | Freq: Every day | INTRAMUSCULAR | Status: DC
  Administered 2019-02-04: 40 mg via INTRAVENOUS
  Filled 2019-02-04: qty 1

## 2019-02-04 MED ORDER — INSULIN ASPART 100 UNIT/ML ~~LOC~~ SOLN
0.0000 [IU] | Freq: Three times a day (TID) | SUBCUTANEOUS | Status: DC
  Administered 2019-02-04: 3 [IU] via SUBCUTANEOUS
  Administered 2019-02-05: 2 [IU] via SUBCUTANEOUS
  Administered 2019-02-05: 3 [IU] via SUBCUTANEOUS
  Administered 2019-02-05: 1 [IU] via SUBCUTANEOUS
  Administered 2019-02-06 – 2019-02-07 (×3): 2 [IU] via SUBCUTANEOUS
  Filled 2019-02-04 (×7): qty 1

## 2019-02-04 MED ORDER — MOMETASONE FURO-FORMOTEROL FUM 100-5 MCG/ACT IN AERO
2.0000 | INHALATION_SPRAY | Freq: Two times a day (BID) | RESPIRATORY_TRACT | Status: DC
  Administered 2019-02-04: 2 via RESPIRATORY_TRACT
  Filled 2019-02-04: qty 8.8

## 2019-02-04 MED ORDER — LACTATED RINGERS IV BOLUS
500.0000 mL | Freq: Once | INTRAVENOUS | Status: AC
  Administered 2019-02-04: 500 mL via INTRAVENOUS

## 2019-02-04 MED ORDER — LACTATED RINGERS IV BOLUS
1000.0000 mL | Freq: Once | INTRAVENOUS | Status: AC
  Administered 2019-02-04: 1000 mL via INTRAVENOUS

## 2019-02-04 MED ORDER — ONDANSETRON HCL 4 MG/2ML IJ SOLN: 4.0000 mg | Freq: Four times a day (QID) | INTRAMUSCULAR | Status: DC | PRN

## 2019-02-04 MED ORDER — SIMVASTATIN 20 MG PO TABS
20.0000 mg | ORAL_TABLET | Freq: Every day | ORAL | Status: DC
  Administered 2019-02-04 – 2019-02-06 (×3): 20 mg via ORAL
  Filled 2019-02-04: qty 2
  Filled 2019-02-04: qty 1
  Filled 2019-02-04: qty 2

## 2019-02-04 MED ORDER — SODIUM CHLORIDE 0.9 % IV SOLN
500.0000 mg | INTRAVENOUS | Status: DC
  Administered 2019-02-04: 500 mg via INTRAVENOUS
  Filled 2019-02-04: qty 500

## 2019-02-04 MED ORDER — SODIUM CHLORIDE 0.9 % IV SOLN
2.0000 g | Freq: Once | INTRAVENOUS | Status: AC
  Administered 2019-02-04: 2 g via INTRAVENOUS
  Filled 2019-02-04: qty 2

## 2019-02-04 MED ORDER — VANCOMYCIN HCL IN DEXTROSE 1-5 GM/200ML-% IV SOLN
1000.0000 mg | Freq: Once | INTRAVENOUS | Status: AC
  Administered 2019-02-04: 1000 mg via INTRAVENOUS
  Filled 2019-02-04: qty 200

## 2019-02-04 MED ORDER — ONDANSETRON HCL 4 MG PO TABS
4.0000 mg | ORAL_TABLET | Freq: Four times a day (QID) | ORAL | Status: DC | PRN
  Filled 2019-02-04: qty 1

## 2019-02-04 MED ORDER — SODIUM CHLORIDE 0.9 % IV SOLN: 2.0000 g | INTRAVENOUS | Status: DC

## 2019-02-04 MED ORDER — HYDROCODONE-ACETAMINOPHEN 10-325 MG PO TABS
1.0000 | ORAL_TABLET | Freq: Four times a day (QID) | ORAL | Status: DC | PRN
  Administered 2019-02-04 – 2019-02-05 (×3): 1 via ORAL
  Filled 2019-02-04 (×5): qty 1

## 2019-02-04 MED ORDER — ALBUTEROL SULFATE HFA 108 (90 BASE) MCG/ACT IN AERS
2.0000 | INHALATION_SPRAY | Freq: Four times a day (QID) | RESPIRATORY_TRACT | Status: DC
  Administered 2019-02-04: 2 via RESPIRATORY_TRACT
  Filled 2019-02-04: qty 6.7

## 2019-02-04 MED ORDER — METRONIDAZOLE IN NACL 5-0.79 MG/ML-% IV SOLN
500.0000 mg | Freq: Once | INTRAVENOUS | Status: AC
  Administered 2019-02-04: 500 mg via INTRAVENOUS
  Filled 2019-02-04: qty 100

## 2019-02-04 MED ORDER — APIXABAN 2.5 MG PO TABS
2.5000 mg | ORAL_TABLET | Freq: Two times a day (BID) | ORAL | Status: DC
  Administered 2019-02-04 – 2019-02-07 (×7): 2.5 mg via ORAL
  Filled 2019-02-04 (×8): qty 1

## 2019-02-04 MED ORDER — HYDROCORTISONE NA SUCCINATE PF 100 MG IJ SOLR
100.0000 mg | Freq: Three times a day (TID) | INTRAMUSCULAR | Status: DC
  Administered 2019-02-04 – 2019-02-05 (×3): 100 mg via INTRAVENOUS
  Filled 2019-02-04 (×3): qty 2

## 2019-02-04 MED ORDER — LACTATED RINGERS IV BOLUS
1000.0000 mL | Freq: Once | INTRAVENOUS | Status: AC
  Administered 2019-02-04: 21:00:00 1000 mL via INTRAVENOUS

## 2019-02-04 NOTE — H&P (Signed)
[facility] [place] NAME: Johnathan Hudson    MR#:  [id number]  DATE OF BIRTH:  1934-03-04  DATE OF ADMISSION:  02/04/2019  PRIMARY CARE PHYSICIAN: Tracie Harrier, MD   REQUESTING/REFERRING PHYSICIAN: Dr Audrea Muscat  CHIEF COMPLAINT:   Chief Complaint  Patient presents with  . Fever    HISTORY OF PRESENT ILLNESS:  Johnathan Hudson  is a 85 y.o. male coming in with not feeling well.  He states that he had fever last night and some chills.  He feels short of breath and some coughing.  He is feeling very weak.  He lives by himself.  Hospitalist services contacted for sepsis and pneumonia seen on chest x-ray.  COVID-19 testing sent off but still pending.  PAST MEDICAL HISTORY:   Past Medical History:  Diagnosis Date  . Anxiety   . Arthritis    rheumatoid  . Benign essential tremor   . Cardiomyopathy ([place])    mild  . Chronic kidney disease    kidney stones  . COPD (chronic obstructive pulmonary disease) ([place])   . Diabetes mellitus without complication ([place])   . Dysrhythmia    atrial fib  . Episodic atrial fibrillation ([place])   . Hyperlipemia   . Hypertension   . Lumbar stenosis with neurogenic claudication   . Osteoarthritis   . Psoriasis   . Renal insufficiency     PAST SURGICAL HISTORY:   Past Surgical History:  Procedure Laterality Date  . CYSTOSCOPY  1993 and 2004  . CYSTOSCOPY WITH URETHRAL DILATATION Left 02/05/2017   Procedure: CYSTOSCOPY WITH URETHRAL DILATATION;  Surgeon: Royston Cowper, MD;  Location: [facility] ORS;  Service: Urology;  Laterality: Left;  . INSERT / REPLACE / REMOVE PACEMAKER    . KIDNEY STONE SURGERY Left   . LASER OF PROSTATE W/ GREEN LIGHT PVP  2004   photovaporization of prostate with greelight laser   . ORIF ANKLE FRACTURE Right   . PACEMAKER INSERTION    . PACEMAKER INSERTION N/A 03/01/2015   Procedure: PACEMAKER CHANGE OUT;  Surgeon: Isaias Cowman, MD;  Location: [facility] ORS;  Service:  Cardiovascular;  Laterality: N/A;  . PENILE PROSTHESIS IMPLANT  1995   inflatable  . RETINAL DETACHMENT SURGERY Left   . TONSILLECTOMY      SOCIAL HISTORY:   Social History   Tobacco Use  . Smoking status: Former Research scientist (life sciences)  . Smokeless tobacco: Current User    Types: Chew  Substance Use Topics  . Alcohol use: No    FAMILY HISTORY:   Family History  Problem Relation Age of Onset  . Cancer Mother   . Alzheimer's disease Father     DRUG ALLERGIES:   Allergies  Allergen Reactions  . Sulfa Antibiotics Rash  . Lipitor [Atorvastatin] Other (See Comments)    Myalgia    REVIEW OF SYSTEMS:  CONSTITUTIONAL: Positive for fever, chills and sweats.  Positive for fatigue and weakness.  EYES: No blurred or double vision.  EARS, NOSE, AND THROAT: No tinnitus or ear pain. No sore throat RESPIRATORY: Positive for cough.positive for shortness of breath.no wheezing or hemoptysis.  CARDIOVASCULAR: No chest pain, orthopnea, edema.  GASTROINTESTINAL: No nausea, vomiting, diarrhea or abdominal pain. No blood in bowel movements GENITOURINARY: No dysuria, hematuria.  ENDOCRINE: No polyuria, nocturia,  HEMATOLOGY: No anemia, easy bruising or bleeding SKIN: No rash or lesion. MUSCULOSKELETAL: No joint pain or arthritis.   NEUROLOGIC: No tingling, numbness, weakness.  PSYCHIATRY: No anxiety or depression.  MEDICATIONS AT HOME:   Prior to Admission medications   Medication Sig Start Date End Date Taking? Authorizing Provider  acetaminophen (TYLENOL) 500 MG tablet Take 1,000 mg 3 (three) times daily as needed by mouth for moderate pain.     [provider]  acetaminophen-codeine (TYLENOL #3) 300-30 MG tablet Take 1 tablet 2 (two) times daily as needed by mouth for moderate pain.  01/28/17   [provider]  apixaban (ELIQUIS) 2.5 MG TABS tablet Take 2.5 mg by mouth 2 (two) times daily.    [provider]  clotrimazole-betamethasone (LOTRISONE) cream APPLY TO AFFECTED  AREAS TWICE DAILY AS NEEDED FOR RASH 01/22/17   [provider]  gabapentin (NEURONTIN) 300 MG capsule Take 300 mg 2 (two) times daily by mouth. Two capsules     [provider]  glipiZIDE (GLUCOTROL XL) 2.5 MG 24 hr tablet Take 2.5 mg by mouth daily with breakfast.    [provider]  levofloxacin (LEVAQUIN) 500 MG tablet Take 1 tablet (500 mg total) by mouth daily. 01/19/18   Fisher, Linden Dolin, PA-C  omeprazole (PRILOSEC) 20 MG capsule Take 20 mg by mouth 2 (two) times daily before a meal.    [provider]  oxyCODONE-acetaminophen (PERCOCET/ROXICET) 5-325 MG tablet Take 1 tablet by mouth every 4 (four) hours as needed for severe pain. 01/19/18   Fisher, Linden Dolin, PA-C  quinapril (ACCUPRIL) 10 MG tablet Take 10 mg by mouth daily.    [provider]  sertraline (ZOLOFT) 50 MG tablet Take 50 mg by mouth daily.    [provider]  simvastatin (ZOCOR) 20 MG tablet Take 20 mg by mouth daily at 6 PM.    [provider]      VITAL SIGNS:  Blood pressure (!) 80/58, pulse (!) 108, temperature (!) 100.7 F (38.2 C), temperature source Oral, resp. rate (!) 24, height 5\' 6"  (1.676 m), weight 73.5 kg, SpO2 93 %.  PHYSICAL EXAMINATION:  GENERAL:  85 y.o.-year-old patient lying in the bed with no acute distress.  EYES: Pupils equal, round, reactive to light and accommodation. No scleral icterus. Extraocular muscles intact.  HEENT: Head atraumatic, normocephalic. Oropharynx and nasopharynx clear.  NECK:  Supple, no jugular venous distention. No thyroid enlargement, no tenderness.  LUNGS: Decreased breath sounds bilaterally, no wheezing, rales,rhonchi or crepitation. No use of accessory muscles of respiration.  CARDIOVASCULAR: S1, S2 irregular regular tachycardic. No murmurs, rubs, or gallops.  ABDOMEN: Soft, nontender, nondistended. Bowel sounds present. No organomegaly or mass.  EXTREMITIES: No pedal edema, cyanosis, or clubbing.  NEUROLOGIC:  Cranial nerves II through XII are intact. Muscle strength 5/5 in all extremities. Sensation intact. Gait not checked.  PSYCHIATRIC: The patient is alert and oriented x 3.  SKIN: No rash, lesion, or ulcer.   LABORATORY PANEL:   CBC Recent Labs  Lab 02/04/19 0921  WBC 12.5*  HGB 13.5  HCT 41.2  PLT 223   ------------------------------------------------------------------------------------------------------------------  Chemistries  Recent Labs  Lab 02/04/19 0945  NA 140  K 4.7  CL 109  CO2 20*  GLUCOSE 134*  BUN 25*  CREATININE 1.62*  CALCIUM 8.9  AST 14*  ALT 9  ALKPHOS 49  BILITOT 0.5   ------------------------------------------------------------------------------------------------------------------    RADIOLOGY:  Dg Chest Port 1 View  Result Date: 02/04/2019 CLINICAL DATA:  Fever.  Shortness of breath. EXAM: PORTABLE CHEST 1 VIEW COMPARISON:  February 23, 2015 FINDINGS: The cardiomediastinal silhouette is stable. Stable pacemaker. The right lung is clear. There is  mild opacity in the left mid lower lung. No other acute abnormalities. IMPRESSION: Mild opacity in left lung may represent infection or asymmetric edema. Recommend clinical correlation. Electronically Signed   By: Dorise Bullion III M.D   On: 02/04/2019 09:55    EKG:   Atrial fibrillation 115 bpm, flipped T waves inferiorly and laterally.  IMPRESSION AND PLAN:   1.  Sepsis (present on admission) with hypotension.  Pneumonia on chest x-ray.  Blood pressure in the 80s when I saw him.  Give another fluid bolus.  Already received 1 in the ED.  Hold antihypertensive medications.  Admit to stepdown unit.  Rocephin and Zithromax ordered by me.  Aggressive antibiotics ordered by ER physician. Follow-up blood cultures. 2.  Atrial fibrillation with rapid ventricular response.  Chronic.  IV fluid hydration has helped a little bit.  Continue hydration. 3.  Chronic kidney disease stage IIIa.  Continue to monitor  with hydration. 4.  Type 2 diabetes mellitus.  Hold glipizide and put on sliding scale. 5.  COPD.  Placed on albuterol inhaler.  Also placed on Dulera inhaler.  Give daily Solu-Medrol. 6.  Diabetic neuropathy on Neurontin 7.  Depression on Zoloft   All the records are reviewed and case discussed with ED provider. Management plans discussed with the patient, family and they are in agreement.  CODE STATUS: DNR  TOTAL TIME TAKING CARE OF THIS PATIENT: 50 minutes.    Loletha Grayer M.D on 02/04/2019 at 11:55 AM  Between 7am to 6pm - Pager - [phone]  After 6pm call admission pager [phone]  [facility]  CC: Primary care physician; Tracie Harrier, MD

## 2019-02-04 NOTE — Progress Notes (Signed)
CODE SEPSIS - PHARMACY COMMUNICATION  **Broad Spectrum Antibiotics should be administered within 1 hour of Sepsis diagnosis**  Time Code Sepsis Called/Page Received: [id number]  Antibiotics Ordered: cefepime, metronidazole, vancomycin  Time of 1st antibiotic administration: 0954  Additional action taken by pharmacy: Spoke w/ RN about giving broad-spectrum beta lactam first in sepsis.   [place] Resident 02/04/2019  10:02 AM

## 2019-02-04 NOTE — ED Triage Notes (Signed)
First Nurse Note:  Awoke this morning at 0200 with c/o fever and SOB.  Tylenol taken at 0730.

## 2019-02-04 NOTE — ED Notes (Signed)
Pt provided with meal tray.

## 2019-02-04 NOTE — ED Notes (Signed)
Pt daughter called, updated on pt status.

## 2019-02-04 NOTE — Progress Notes (Addendum)
Consultation was requested via secure chat however not on orders.  Consultation was for hypotension in the setting of sepsis.  Patient is DNR.  I have reviewed the patient's vital signs and he is responding well to fluids.  Additional fluid bolus was given.  Took the liberty to make some exchanges and medications to optimize his management.  I have reviewed his CT scan of the chest from prior he does have a very large hiatal hernia with intrathoracic stomach and the possibility of aspiration is highly likely.  He has a faint left lower lobe infiltrate.  Agree with antibiotic therapy would de-escalate once cultures are known.  Because of his shock physiology, which appears to be correcting, recommend fluid therapy and switching methylprednisolone to hydrocortisone.  Add DuoNeb as needed for shortness of breath, secretion mobilization and/or wheezing.  Currently the patient is in no distress.  Reassess volume status in the morning and adjust IV fluids accordingly.  COVID-19 testing pending.  MRSA in the nares was positive.  As the patient does not appear to be critically ill currently or requiring critical care services will not actively follow but please do place formal consult if needed.    Renold Don, MD [facility]   *This note was dictated using voice recognition software/Dragon.  Despite best efforts to proofread, errors can occur which can change the meaning.  Any change was purely unintentional.

## 2019-02-04 NOTE — ED Notes (Signed)
Pt daughter called and updated on pt status

## 2019-02-04 NOTE — ED Provider Notes (Signed)
[facility] Emergency Department Provider Note   ____________________________________________   First MD Initiated Contact with Patient 02/04/19 [phone]     (approximate)  I have reviewed the triage vital signs and the nursing notes.   HISTORY  Chief Complaint Fever    HPI Johnathan Hudson is a 85 y.o. male with possible history of essential tremor, atrial fibrillation on Eliquis, COPD, diabetes, and chronic kidney disease presents to the ED complaining of fever and chills.  Patient reports that he woke up this morning around 2 AM feeling feverish with chills and increased tremor.  He has a tremor at baseline, but states it is much worse since he started feeling bad overnight.  He states he has had a nonproductive cough since yesterday and has been feeling increasingly short of breath.  He denies any associated chest pain, abdominal pain, dysuria, hematuria, nausea, vomiting, or diarrhea.  He is not aware of any sick contacts or COVID-19 exposures.        Past Medical History:  Diagnosis Date  . Anxiety   . Arthritis    rheumatoid  . Benign essential tremor   . Cardiomyopathy ([place])    mild  . Chronic kidney disease    kidney stones  . COPD (chronic obstructive pulmonary disease) ([place])   . Diabetes mellitus without complication ([place])   . Dysrhythmia    atrial fib  . Episodic atrial fibrillation ([place])   . Hyperlipemia   . Hypertension   . Lumbar stenosis with neurogenic claudication   . Osteoarthritis   . Psoriasis   . Renal insufficiency     Patient Active Problem List   Diagnosis Date Noted  . Sepsis ([place]) 02/04/2019  . Atrial fibrillation with RVR ([place])   . Community acquired pneumonia   . Hypotension   . Type 2 diabetes mellitus with stage 3a chronic kidney disease, without long-term current use of insulin ([place])     Past Surgical History:  Procedure Laterality Date  . CYSTOSCOPY  1993 and 2004  . CYSTOSCOPY WITH URETHRAL DILATATION  Left 02/05/2017   Procedure: CYSTOSCOPY WITH URETHRAL DILATATION;  Surgeon: Royston Cowper, MD;  Location: [facility] ORS;  Service: Urology;  Laterality: Left;  . INSERT / REPLACE / REMOVE PACEMAKER    . KIDNEY STONE SURGERY Left   . LASER OF PROSTATE W/ GREEN LIGHT PVP  2004   photovaporization of prostate with greelight laser   . ORIF ANKLE FRACTURE Right   . PACEMAKER INSERTION    . PACEMAKER INSERTION N/A 03/01/2015   Procedure: PACEMAKER CHANGE OUT;  Surgeon: Isaias Cowman, MD;  Location: [facility] ORS;  Service: Cardiovascular;  Laterality: N/A;  . PENILE PROSTHESIS IMPLANT  1995   inflatable  . RETINAL DETACHMENT SURGERY Left   . TONSILLECTOMY      Prior to Admission medications   Medication Sig Start Date End Date Taking? Authorizing Provider  acetaminophen (TYLENOL) 500 MG tablet Take 1,000 mg 3 (three) times daily as needed by mouth for moderate pain.    Yes [provider]  ALPRAZolam (XANAX) 0.25 MG tablet Take 0.25 mg by mouth at bedtime as needed for sleep. 01/27/19  Yes [provider]  apixaban (ELIQUIS) 2.5 MG TABS tablet Take 2.5 mg by mouth 2 (two) times daily.   Yes [provider]  gabapentin (NEURONTIN) 300 MG capsule Take 300 mg by mouth 2 (two) times daily.    Yes [provider]  glipiZIDE (GLUCOTROL XL) 2.5 MG 24 hr tablet  Take 2.5 mg by mouth daily with breakfast.   Yes [provider]  HYDROcodone-acetaminophen (NORCO) 7.5-325 MG tablet Take 1 tablet by mouth 3 (three) times daily as needed for pain. 01/08/19  Yes [provider]  LORazepam (ATIVAN) 0.5 MG tablet Take 0.5 mg by mouth 2 (two) times daily as needed for anxiety. 01/01/19  Yes [provider]  methenamine (HIPREX) 1 g tablet Take 1 g by mouth 2 (two) times daily with a meal.   Yes [provider]  omeprazole (PRILOSEC) 20 MG capsule Take 20 mg by mouth 2 (two) times daily before a meal.   Yes [provider]  quinapril  (ACCUPRIL) 10 MG tablet Take 10 mg by mouth daily.   Yes [provider]  sertraline (ZOLOFT) 50 MG tablet Take 50 mg by mouth daily.   Yes [provider]  simvastatin (ZOCOR) 20 MG tablet Take 20 mg by mouth every evening.    Yes [provider]  levofloxacin (LEVAQUIN) 500 MG tablet Take 1 tablet (500 mg total) by mouth daily. Patient not taking: Reported on 02/04/2019 01/19/18   Versie Starks, PA-C  oxyCODONE-acetaminophen (PERCOCET/ROXICET) 5-325 MG tablet Take 1 tablet by mouth every 4 (four) hours as needed for severe pain. Patient not taking: Reported on 02/04/2019 01/19/18   Versie Starks, PA-C    Allergies Sulfa antibiotics and Lipitor [atorvastatin]  Family History  Problem Relation Age of Onset  . Cancer Mother   . Alzheimer's disease Father     Social History Social History   Tobacco Use  . Smoking status: Former Research scientist (life sciences)  . Smokeless tobacco: Current User    Types: Chew  Substance Use Topics  . Alcohol use: No  . Drug use: No    Review of Systems  Constitutional: Positive for fever/chills Eyes: No visual changes. ENT: No sore throat. Cardiovascular: Denies chest pain. Respiratory: Positive for cough and shortness of breath. Gastrointestinal: No abdominal pain.  No nausea, no vomiting.  No diarrhea.  No constipation. Genitourinary: Negative for dysuria. Musculoskeletal: Negative for back pain. Skin: Negative for rash. Neurological: Negative for headaches, focal weakness or numbness.  ____________________________________________   PHYSICAL EXAM:  VITAL SIGNS: ED Triage Vitals  Enc Vitals Group     BP 02/04/19 0900 110/62     Pulse Rate 02/04/19 0900 (!) 127     Resp 02/04/19 0900 20     Temp 02/04/19 0900 (!) 100.7 F (38.2 C)     Temp Source 02/04/19 0900 Oral     SpO2 02/04/19 0900 91 %     Weight 02/04/19 0903 162 lb (73.5 kg)     Height 02/04/19 0903 5\' 6"  (1.676 m)     Head Circumference --      Peak Flow --       Pain Score 02/04/19 0903 0     Pain Loc --      Pain Edu? --      Excl. in [place]? --     Constitutional: Alert and oriented. Eyes: Conjunctivae are normal. Head: Atraumatic. Nose: No congestion/rhinnorhea. Mouth/Throat: Mucous membranes are moist. Neck: Normal ROM Cardiovascular: Tachycardic, irregularly irregular rhythm. Grossly normal heart sounds. Respiratory: Normal respiratory effort.  No retractions. Lungs CTAB. Gastrointestinal: Soft and nontender. No distention. Genitourinary: deferred Musculoskeletal: No lower extremity tenderness nor edema. Neurologic:  Normal speech and language. No gross focal neurologic deficits are appreciated.  Tremulous in bilateral upper extremities. Skin:  Skin is warm, dry and intact. No rash noted.  Psychiatric: Mood and affect are normal. Speech and behavior are normal.  ____________________________________________   LABS (all labs ordered are listed, but only abnormal results are displayed)  Labs Reviewed  MRSA PCR SCREENING - Abnormal; Notable for the following components:      Result Value   MRSA by PCR POSITIVE (*)    All other components within normal limits  CBC WITH DIFFERENTIAL/PLATELET - Abnormal; Notable for the following components:   WBC 12.5 (*)    Neutro Abs 10.9 (*)    Lymphs Abs 0.5 (*)    All other components within normal limits  URINALYSIS, ROUTINE W REFLEX MICROSCOPIC - Abnormal; Notable for the following components:   Color, Urine YELLOW (*)    APPearance CLEAR (*)    All other components within normal limits  COMPREHENSIVE METABOLIC PANEL - Abnormal; Notable for the following components:   CO2 20 (*)    Glucose, Bld 134 (*)    BUN 25 (*)    Creatinine, Ser 1.62 (*)    AST 14 (*)    GFR calc non Af Amer 39 (*)    GFR calc Af Amer 45 (*)    All other components within normal limits  TROPONIN I (HIGH SENSITIVITY) - Abnormal; Notable for the following components:   Troponin I (High Sensitivity) 23 (*)    All  other components within normal limits  TROPONIN I (HIGH SENSITIVITY) - Abnormal; Notable for the following components:   Troponin I (High Sensitivity) 21 (*)    All other components within normal limits  CULTURE, BLOOD (ROUTINE X 2)  CULTURE, BLOOD (ROUTINE X 2)  URINE CULTURE  SARS CORONAVIRUS 2 (TAT 6-24 HRS)  LACTIC ACID, PLASMA  PROCALCITONIN  PROTIME-INR  APTT  LACTIC ACID, PLASMA  HEMOGLOBIN A1C   ____________________________________________  EKG  ED ECG REPORT I, Blake Divine, the attending physician, personally viewed and interpreted this ECG.   Date: 02/04/2019  EKG Time: 9:10  Rate: 115  Rhythm: atrial fibrillation, rate 115  Axis: Normal  Intervals:none  ST&T Change: Diffuse T wave inversions, similar to prior   PROCEDURES  Procedure(s) performed (including Critical Care):  .Critical Care Performed by: Blake Divine, MD Authorized by: Blake Divine, MD   Critical care provider statement:    Critical care time (minutes):  45   Critical care time was exclusive of:  Separately billable procedures and treating other patients and teaching time   Critical care was necessary to treat or prevent imminent or life-threatening deterioration of the following conditions:  Sepsis   Critical care was time spent personally by me on the following activities:  Discussions with consultants, evaluation of patient's response to treatment, examination of patient, ordering and performing treatments and interventions, ordering and review of laboratory studies, ordering and review of radiographic studies, pulse oximetry, re-evaluation of patient's condition, obtaining history from patient or surrogate and review of old charts   I assumed direction of critical care for this patient from another provider in my specialty: no       ____________________________________________   INITIAL IMPRESSION / ASSESSMENT AND PLAN / ED COURSE       85 year old male with history of A.  fib on Eliquis, COPD, diabetes, CKD presents to the ED with increasing cough and shortness of breath since yesterday, noted fever and chills overnight.  On arrival he is febrile to 100.7 and tachycardic to the 120s with a stable blood pressure.  Code sepsis initiated and will start patient on broad-spectrum antibiotics for now.  EKG shows rapid A. fib with no acute ischemic changes, will continue to evaluate patient's rate as he receives fluid resuscitation.  Plan to hold off on rate control medications for now.  Most likely source appears to be pneumonia, will check UA but no reason to suspect intra-abdominal source.  Chest x-ray is concerning for pneumonia, no evidence of infection on UA.  Patient's heart rate is improving following IV fluids, although he remains in atrial fibrillation.  Remainder of labs are unremarkable and he is in no respiratory distress.  Case discussed with hospitalist, who accepts patient for admission.      ____________________________________________   FINAL CLINICAL IMPRESSION(S) / ED DIAGNOSES  Final diagnoses:  Sepsis without acute organ dysfunction, due to unspecified organism [facility])  Community acquired pneumonia, unspecified laterality  Atrial fibrillation with RVR [facility])     ED Discharge Orders    None       Note:  This document was prepared using Dragon voice recognition software and may include unintentional dictation errors.   Blake Divine, MD 02/04/19 [phone]

## 2019-02-04 NOTE — ED Triage Notes (Signed)
Pt in via POV, reports new onset fever, shortness of breath, tremors since waking up this morning.  Pt febrile upon arrival, tremors noted to bilateral arms, tachycardic in 120's.  NAD noted at this time.

## 2019-02-04 NOTE — ED Notes (Signed)
Gave pt meal tray.

## 2019-02-04 NOTE — Consult Note (Signed)
PHARMACY -  BRIEF ANTIBIOTIC NOTE   Pharmacy has received consult(s) for cefepime and vancomycin from an ED provider. Patient is also ordered metronidazole.The patient's profile has been reviewed for ht/wt/allergies/indication/available labs.    Patient is an 85 y/o M with essential tremor, atrial fibrillation on apixaban, COPD, diabetes, CKD who presents to [facility] c/o fevers, chills, worsening shortness of breath. Per chart oxygen saturations are in the low 90s on room air. Reports no known COVID sick contacts. COVID pending. PCT pending. MRSA PCR ordered. Most recent MRSA PCR with MSSA. Recent antibiotic course of ciprofloxacin at the end of October for Enterobacter cloacae UTI.   One time order(s) placed for  -cefepime 2 g already ordered -vancomycin 1 g already ordered  Further antibiotics/pharmacy consults should be ordered by admitting physician if indicated.                       Thank you, [place] Resident 02/04/2019  9:49 AM

## 2019-02-05 DIAGNOSIS — J441 Chronic obstructive pulmonary disease with (acute) exacerbation: Secondary | ICD-10-CM | POA: Insufficient documentation

## 2019-02-05 LAB — CBC
HCT: 33.9 % — ABNORMAL LOW (ref 39.0–52.0)
Hemoglobin: 10.6 g/dL — ABNORMAL LOW (ref 13.0–17.0)
MCH: 29.4 pg (ref 26.0–34.0)
MCHC: 31.3 g/dL (ref 30.0–36.0)
MCV: 93.9 fL (ref 80.0–100.0)
Platelets: 182 10*3/uL (ref 150–400)
RBC: 3.61 MIL/uL — ABNORMAL LOW (ref 4.22–5.81)
RDW: 13.8 % (ref 11.5–15.5)
WBC: 13.9 10*3/uL — ABNORMAL HIGH (ref 4.0–10.5)
nRBC: 0 % (ref 0.0–0.2)

## 2019-02-05 LAB — BASIC METABOLIC PANEL
Anion gap: 8 (ref 5–15)
BUN: 28 mg/dL — ABNORMAL HIGH (ref 8–23)
CO2: 20 mmol/L — ABNORMAL LOW (ref 22–32)
Calcium: 8.6 mg/dL — ABNORMAL LOW (ref 8.9–10.3)
Chloride: 110 mmol/L (ref 98–111)
Creatinine, Ser: 1.68 mg/dL — ABNORMAL HIGH (ref 0.61–1.24)
GFR calc Af Amer: 43 mL/min — ABNORMAL LOW (ref >60)
GFR calc non Af Amer: 37 mL/min — ABNORMAL LOW (ref >60)
Glucose, Bld: 145 mg/dL — ABNORMAL HIGH (ref 70–99)
Potassium: 4.7 mmol/L (ref 3.5–5.1)
Sodium: 138 mmol/L (ref 135–145)

## 2019-02-05 LAB — GLUCOSE, CAPILLARY
Glucose-Capillary: 146 mg/dL — ABNORMAL HIGH (ref 70–99)
Glucose-Capillary: 238 mg/dL — ABNORMAL HIGH (ref 70–99)
Glucose-Capillary: 199 mg/dL — ABNORMAL HIGH (ref 70–99)
Glucose-Capillary: 176 mg/dL — ABNORMAL HIGH (ref 70–99)

## 2019-02-05 LAB — CORTISOL: Cortisol, Plasma: 30 ug/dL

## 2019-02-05 LAB — URINE CULTURE

## 2019-02-05 MED ORDER — DOXYCYCLINE HYCLATE 100 MG PO TABS
100.0000 mg | ORAL_TABLET | Freq: Two times a day (BID) | ORAL | Status: DC
  Administered 2019-02-05 – 2019-02-07 (×5): 100 mg via ORAL
  Filled 2019-02-05 (×5): qty 1

## 2019-02-05 MED ORDER — BUDESONIDE 0.5 MG/2ML IN SUSP
0.5000 mg | Freq: Two times a day (BID) | RESPIRATORY_TRACT | Status: DC
  Administered 2019-02-05 – 2019-02-07 (×5): 0.5 mg via RESPIRATORY_TRACT
  Filled 2019-02-05 (×6): qty 2

## 2019-02-05 MED ORDER — HYDROCODONE-ACETAMINOPHEN 10-325 MG PO TABS
1.0000 | ORAL_TABLET | Freq: Four times a day (QID) | ORAL | Status: DC | PRN
  Administered 2019-02-05 – 2019-02-07 (×6): 1 via ORAL
  Filled 2019-02-05 (×7): qty 1

## 2019-02-05 MED ORDER — HYDROCORTISONE NA SUCCINATE PF 100 MG IJ SOLR
50.0000 mg | Freq: Three times a day (TID) | INTRAMUSCULAR | Status: DC
  Administered 2019-02-05 – 2019-02-06 (×3): 50 mg via INTRAVENOUS
  Filled 2019-02-05 (×3): qty 1
  Filled 2019-02-05: qty 2
  Filled 2019-02-05: qty 1

## 2019-02-05 MED ORDER — ALPRAZOLAM 0.25 MG PO TABS
0.2500 mg | ORAL_TABLET | Freq: Every evening | ORAL | Status: DC | PRN
  Administered 2019-02-05: 0.25 mg via ORAL
  Filled 2019-02-05: qty 1

## 2019-02-05 MED ORDER — HYDROCODONE-ACETAMINOPHEN 5-325 MG PO TABS
1.0000 | ORAL_TABLET | Freq: Four times a day (QID) | ORAL | Status: DC | PRN
  Administered 2019-02-05: 1 via ORAL
  Filled 2019-02-05: qty 1

## 2019-02-05 MED ORDER — IPRATROPIUM-ALBUTEROL 0.5-2.5 (3) MG/3ML IN SOLN
3.0000 mL | Freq: Four times a day (QID) | RESPIRATORY_TRACT | Status: DC
  Administered 2019-02-05 – 2019-02-06 (×5): 3 mL via RESPIRATORY_TRACT
  Filled 2019-02-05 (×6): qty 3

## 2019-02-05 NOTE — Progress Notes (Signed)
Received to room 232 from ER via wheelchair at [place]. Assisted to bed and positioned for comfort. Oriented to room, bed and unit.

## 2019-02-05 NOTE — Progress Notes (Signed)
Patient ID: Johnathan Hudson, male   DOB: 1934-01-19, 85 y.o.   MRN: [id number] [facility] PROGRESS NOTE  KAVEON ITO [id number] DOB: Nov 16, 1933 DOA: 02/04/2019 PCP: Tracie Harrier, MD  HPI/Subjective: Patient feeling better today than yesterday.  He actually asked me if he can go home.  Blood pressure trended better this morning.  Patient states he is breathing better.  Still with a little cough.  Objective: Vitals:   02/05/19 1000 02/05/19 1015  BP: (!) 85/63   Pulse: 72   Resp:    Temp:    SpO2: 97% 95%    Intake/Output Summary (Last 24 hours) at 02/05/2019 1051 Last data filed at 02/04/2019 1603 Gross per 24 hour  Intake 1050 ml  Output -  Net 1050 ml   Filed Weights   02/04/19 0903  Weight: 73.5 kg    ROS: Review of Systems  Constitutional: Negative for chills and fever.  Eyes: Negative for blurred vision.  Respiratory: Positive for cough and shortness of breath.   Cardiovascular: Negative for chest pain.  Gastrointestinal: Negative for abdominal pain, constipation, diarrhea, nausea and vomiting.  Genitourinary: Negative for dysuria.  Musculoskeletal: Negative for joint pain.  Neurological: Negative for dizziness and headaches.   Exam: Physical Exam  Constitutional: He is oriented to person, place, and time.  HENT:  Nose: No mucosal edema.  Mouth/Throat: No oropharyngeal exudate or posterior oropharyngeal edema.  Eyes: Pupils are equal, round, and reactive to light. Conjunctivae, EOM and lids are normal.  Neck: No JVD present. Carotid bruit is not present. No edema present. No thyroid mass and no thyromegaly present.  Cardiovascular: S1 normal, S2 normal and normal heart sounds. An irregularly irregular rhythm present. Exam reveals no gallop.  No murmur heard. Pulses:      Dorsalis pedis pulses are 2+ on the right side and 2+ on the left side.  Respiratory: No respiratory distress. He has decreased breath sounds in the right lower field and the  left lower field. He has wheezes in the right middle field, the right lower field, the left middle field and the left lower field. He has no rhonchi. He has no rales.  GI: Soft. Bowel sounds are normal. There is no abdominal tenderness.  Musculoskeletal:     Right ankle: He exhibits no swelling.     Left ankle: He exhibits no swelling.  Lymphadenopathy:    He has no cervical adenopathy.  Neurological: He is alert and oriented to person, place, and time. No cranial nerve deficit.  Skin: Skin is warm. No rash noted. Nails show no clubbing.  Psychiatric: He has a normal mood and affect.      Data Reviewed: Basic Metabolic Panel: Recent Labs  Lab 02/04/19 0945 02/05/19 0534  NA 140 138  K 4.7 4.7  CL 109 110  CO2 20* 20*  GLUCOSE 134* 145*  BUN 25* 28*  CREATININE 1.62* 1.68*  CALCIUM 8.9 8.6*   Liver Function Tests: Recent Labs  Lab 02/04/19 0945  AST 14*  ALT 9  ALKPHOS 49  BILITOT 0.5  PROT 7.5  ALBUMIN 3.8   No results for input(s): LIPASE, AMYLASE in the last 168 hours. No results for input(s): AMMONIA in the last 168 hours. CBC: Recent Labs  Lab 02/04/19 0921 02/05/19 0534  WBC 12.5* 13.9*  NEUTROABS 10.9*  --   HGB 13.5 10.6*  HCT 41.2 33.9*  MCV 90.4 93.9  PLT 223 182     CBG: Recent Labs  Lab 02/04/19 1746  02/04/19 2059 02/05/19 0841  GLUCAP 250* 202* 146*    Recent Results (from the past 240 hour(s))  Blood Culture (routine x 2)     Status: None (Preliminary result)   Collection Time: 02/04/19  9:41 AM   Specimen: BLOOD  Result Value Ref Range Status   Specimen Description BLOOD LEFT FA  Final   Special Requests   Final    BOTTLES DRAWN AEROBIC AND ANAEROBIC Blood Culture results may not be optimal due to an excessive volume of blood received in culture bottles   Culture   Final    NO GROWTH < 24 HOURS Performed at [facility], [address], [place], [place] [address]    Report Status PENDING  Incomplete  Blood Culture  (routine x 2)     Status: None (Preliminary result)   Collection Time: 02/04/19  9:41 AM   Specimen: BLOOD  Result Value Ref Range Status   Specimen Description BLOOD RIGHT HAND  Final   Special Requests   Final    BOTTLES DRAWN AEROBIC AND ANAEROBIC Blood Culture results may not be optimal due to an excessive volume of blood received in culture bottles   Culture   Final    NO GROWTH < 24 HOURS Performed at [facility], [address], [place], [place] [address]    Report Status PENDING  Incomplete  Urine culture     Status: Abnormal   Collection Time: 02/04/19  9:46 AM   Specimen: In/Out Cath Urine  Result Value Ref Range Status   Specimen Description   Final    IN/OUT CATH URINE Performed at [facility], [address], [place], [place] [address]    Special Requests   Final    NONE Performed at [facility], [address] [place], [place] [address]    Culture MULTIPLE SPECIES PRESENT, SUGGEST RECOLLECTION (A)  Final   Report Status 02/05/2019 FINAL  Final  SARS CORONAVIRUS 2 (TAT 6-24 HRS) Nasopharyngeal Nasopharyngeal Swab     Status: None   Collection Time: 02/04/19 11:11 AM   Specimen: Nasopharyngeal Swab  Result Value Ref Range Status   SARS Coronavirus 2 NEGATIVE NEGATIVE Final    Comment: (NOTE) SARS-CoV-2 target nucleic acids are NOT DETECTED. The SARS-CoV-2 RNA is generally detectable in upper and lower respiratory specimens during the acute phase of infection. Negative results do not preclude SARS-CoV-2 infection, do not rule out co-infections with other pathogens, and should not be used as the sole basis for treatment or other patient management decisions. Negative results must be combined with clinical observations, patient history, and epidemiological information. The expected result is Negative. Fact Sheet for Patients: SugarRoll.be Fact Sheet for Healthcare  Providers: https://www.woods-mathews.com/ This test is not yet approved or cleared by the [place] FDA and  has been authorized for detection and/or diagnosis of SARS-CoV-2 by FDA under an Emergency Use Authorization (EUA). This EUA will remain  in effect (meaning this test can be used) for the duration of the COVID-19 declaration under Section 56 4(b)(1) of the Act, 21 U.S.C. section 360bbb-3(b)(1), unless the authorization is terminated or revoked sooner. Performed at [facility], [place] [address] [place],  [address]   MRSA PCR Screening     Status: Abnormal   Collection Time: 02/04/19 11:38 AM  Result Value Ref Range Status   MRSA by PCR POSITIVE (A) NEGATIVE Final    Comment:        The GeneXpert MRSA Assay (FDA  approved for NASAL specimens only), is one component of a comprehensive MRSA colonization surveillance program. It is not intended to diagnose MRSA infection nor to guide or monitor treatment for MRSA infections. RESULT CALLED TO, READ BACK BY AND VERIFIED WITH: NOAH GRIFFITH @1318  ON 02/04/2019 BY FMW Performed at [facility], [address] [place], [place] [address]      Studies: Dg Chest [facility] 1 View  Result Date: 02/04/2019 CLINICAL DATA:  Fever.  Shortness of breath. EXAM: PORTABLE CHEST 1 VIEW COMPARISON:  February 23, 2015 FINDINGS: The cardiomediastinal silhouette is stable. Stable pacemaker. The right lung is clear. There is mild opacity in the left mid lower lung. No other acute abnormalities. IMPRESSION: Mild opacity in left lung may represent infection or asymmetric edema. Recommend clinical correlation. Electronically Signed   By: Dorise Bullion III M.D   On: 02/04/2019 09:55    Scheduled Meds: . apixaban  2.5 mg Oral BID  . budesonide (PULMICORT) nebulizer solution  0.5 mg Nebulization BID  . doxycycline  100 mg Oral Q12H  . gabapentin  300 mg Oral BID  . hydrocortisone sod succinate (SOLU-CORTEF) inj  50  mg Intravenous Q8H  . insulin aspart  0-5 Units Subcutaneous QHS  . insulin aspart  0-9 Units Subcutaneous TID WC  . ipratropium-albuterol  3 mL Nebulization Q6H  . pantoprazole  40 mg Oral Daily  . sertraline  50 mg Oral Daily  . simvastatin  20 mg Oral q1800   Continuous Infusions: . cefTRIAXone (ROCEPHIN)  IV Stopped (02/04/19 2139)  . lactated ringers 30 mL/hr at 02/05/19 0737    Assessment/Plan:   1. Clinical sepsis (present on admission) with hypotension and pneumonia lobar left lung.  Blood pressure trended better this morning, changed to telemetry instead of stepdown.  Continue Rocephin.  Zithromax switched over to doxycycline since MRSA positive.  Follow-up blood cultures.  Stress dose steroids. 2. Chronic atrial fibrillation with rapid ventricular response.  Heart rate better with IV fluids.  On Eliquis for anticoagulation 3. Type 2 diabetes mellitus with chronic kidney disease stage IIIa.  Patient on sliding scale insulin.  Hold glipizide.  Continue to monitor creatinine. 4. COPD exacerbation.  On stress dose steroids changed inhalers to nebulizer since COVID-19 testing negative. 5. Diabetic neuropathy on Neurontin 6. Depression on Zoloft 7. Weakness physical therapy evaluation  Code Status:     Code Status Orders  (From admission, onward)         Start     Ordered   02/04/19 1153  Do not attempt resuscitation (DNR)  Continuous    Question Answer Comment  In the event of cardiac or respiratory ARREST Do not call a "code blue"   In the event of cardiac or respiratory ARREST Do not perform Intubation, CPR, defibrillation or ACLS   In the event of cardiac or respiratory ARREST Use medication by any route, position, wound care, and other measures to relive pain and suffering. May use oxygen, suction and manual treatment of airway obstruction as needed for comfort.   Comments nurse may pronounce      02/04/19 1153        Code Status History    Date Active Date  Inactive Code Status Order ID Comments User Context   02/05/2017 1246 02/05/2017 1708 Full Code [id number]  Royston Cowper, MD Inpatient   03/01/2015 1315 03/01/2015 1740 Full Code [id number]  Isaias Cowman, MD Inpatient   Advance Care Planning Activity     Family Communication: Spoke  with daughter on the phone Disposition Plan: To be determined  Consultants:  Critical care team  Antibiotics:  Rocephin  Doxycycline  Time spent: 28 minutes  [place]

## 2019-02-05 NOTE — ED Notes (Signed)
ED TO INPATIENT HANDOFF REPORT  ED Nurse Name and Phone #: Mavis Gravelle [phone]  S Name/Age/Gender Johnathan Hudson 85 y.o. male Room/Bed: ED36A/ED36A  Code Status   Code Status: DNR  Home/SNF/Other Home Patient oriented to: self, place and time Is this baseline? Yes   Triage Complete: Triage complete  Chief Complaint fever sob  Triage Note First Nurse Note:  Awoke this morning at 0200 with c/o fever and SOB.  Tylenol taken at 0730.  Pt in via POV, reports new onset fever, shortness of breath, tremors since waking up this morning.  Pt febrile upon arrival, tremors noted to bilateral arms, tachycardic in 120's.  NAD noted at this time.   Allergies Allergies  Allergen Reactions  . Sulfa Antibiotics Rash  . Lipitor [Atorvastatin] Other (See Comments)    Myalgia    Level of Care/Admitting Diagnosis ED Disposition    ED Disposition Condition [facility] Area: [place] [[id number]]  Level of Care: Telemetry [5]  Covid Evaluation: Confirmed COVID Negative  Diagnosis: Sepsis [facility]) [id number]  Admitting Physician: Loletha Grayer [id number]  Attending Physician: Loletha Grayer [phone]  Estimated length of stay: past midnight tomorrow  Certification:: I certify this patient will need inpatient services for at least 2 midnights  PT Class (Do Not Modify): Inpatient [101]  PT Acc Code (Do Not Modify): Private [1]       B Medical/Surgery History Past Medical History:  Diagnosis Date  . Anxiety   . Arthritis    rheumatoid  . Benign essential tremor   . Cardiomyopathy ([place])    mild  . Chronic kidney disease    kidney stones  . COPD (chronic obstructive pulmonary disease) ([place])   . Diabetes mellitus without complication ([place])   . Dysrhythmia    atrial fib  . Episodic atrial fibrillation ([place])   . Hyperlipemia   . Hypertension   . Lumbar stenosis with neurogenic claudication   . Osteoarthritis   . Psoriasis   . Renal insufficiency     Past Surgical History:  Procedure Laterality Date  . CYSTOSCOPY  1993 and 2004  . CYSTOSCOPY WITH URETHRAL DILATATION Left 02/05/2017   Procedure: CYSTOSCOPY WITH URETHRAL DILATATION;  Surgeon: Royston Cowper, MD;  Location: [facility] ORS;  Service: Urology;  Laterality: Left;  . INSERT / REPLACE / REMOVE PACEMAKER    . KIDNEY STONE SURGERY Left   . LASER OF PROSTATE W/ GREEN LIGHT PVP  2004   photovaporization of prostate with greelight laser   . ORIF ANKLE FRACTURE Right   . PACEMAKER INSERTION    . PACEMAKER INSERTION N/A 03/01/2015   Procedure: PACEMAKER CHANGE OUT;  Surgeon: Isaias Cowman, MD;  Location: [facility] ORS;  Service: Cardiovascular;  Laterality: N/A;  . PENILE PROSTHESIS IMPLANT  1995   inflatable  . RETINAL DETACHMENT SURGERY Left   . TONSILLECTOMY       A IV Location/Drains/Wounds Patient Lines/Drains/Airways Status   Active Line/Drains/Airways    Name:   Placement date:   Placement time:   Site:   Days:   Peripheral IV 02/04/19 Left Arm   02/04/19    0933    Arm   1   Peripheral IV 02/04/19 Right Arm   02/04/19    1205    Arm   1   Incision (Closed) 03/01/15 Other (Comment) Other (Comment)   03/01/15    1236     1437   Incision (Closed) 02/05/17 Penis Other (Comment)   02/05/17  1144     730          Intake/Output Last 24 hours  Intake/Output Summary (Last 24 hours) at 02/05/2019 1934 Last data filed at 02/05/2019 1711 Gross per 24 hour  Intake 740.29 ml  Output -  Net 740.29 ml    Labs/Imaging Results for orders placed or performed during the hospital encounter of 02/04/19 (from the past 48 hour(s))  CBC WITH DIFFERENTIAL     Status: Abnormal   Collection Time: 02/04/19  9:21 AM  Result Value Ref Range   WBC 12.5 (H) 4.0 - 10.5 K/uL   RBC 4.56 4.22 - 5.81 MIL/uL   Hemoglobin 13.5 13.0 - 17.0 g/dL   HCT 41.2 39.0 - 52.0 %   MCV 90.4 80.0 - 100.0 fL   MCH 29.6 26.0 - 34.0 pg   MCHC 32.8 30.0 - 36.0 g/dL   RDW 13.9 11.5 - 15.5 %   Platelets  223 150 - 400 K/uL   nRBC 0.0 0.0 - 0.2 %   Neutrophils Relative % 88 %   Neutro Abs 10.9 (H) 1.7 - 7.7 K/uL   Lymphocytes Relative 4 %   Lymphs Abs 0.5 (L) 0.7 - 4.0 K/uL   Monocytes Relative 7 %   Monocytes Absolute 0.8 0.1 - 1.0 K/uL   Eosinophils Relative 1 %   Eosinophils Absolute 0.2 0.0 - 0.5 K/uL   Basophils Relative 0 %   Basophils Absolute 0.0 0.0 - 0.1 K/uL   Immature Granulocytes 0 %   Abs Immature Granulocytes 0.04 0.00 - 0.07 K/uL    Comment: Performed at [facility], [address] [place], [place] [address]  Hemoglobin A1c     Status: Abnormal   Collection Time: 02/04/19  9:21 AM  Result Value Ref Range   Hgb A1c MFr Bld 6.9 (H) 4.8 - 5.6 %    Comment: (NOTE) Pre diabetes:          5.7%-6.4% Diabetes:              >6.4% Glycemic control for   <7.0% adults with diabetes    Mean Plasma Glucose 151.33 mg/dL    Comment: Performed at [facility], [address], [place], [place] [address]  Blood Culture (routine x 2)     Status: None (Preliminary result)   Collection Time: 02/04/19  9:41 AM   Specimen: BLOOD  Result Value Ref Range   Specimen Description BLOOD LEFT FA    Special Requests      BOTTLES DRAWN AEROBIC AND ANAEROBIC Blood Culture results may not be optimal due to an excessive volume of blood received in culture bottles   Culture      NO GROWTH < 24 HOURS Performed at [facility], [address] [place], [place] [address]    Report Status PENDING   Blood Culture (routine x 2)     Status: None (Preliminary result)   Collection Time: 02/04/19  9:41 AM   Specimen: BLOOD  Result Value Ref Range   Specimen Description BLOOD RIGHT HAND    Special Requests      BOTTLES DRAWN AEROBIC AND ANAEROBIC Blood Culture results may not be optimal due to an excessive volume of blood received in culture bottles   Culture      NO GROWTH < 24 HOURS Performed at [facility], [address], [place], [place] [address]     Report Status PENDING   Comprehensive metabolic panel  Status: Abnormal   Collection Time: 02/04/19  9:45 AM  Result Value Ref Range   Sodium 140 135 - 145 mmol/L   Potassium 4.7 3.5 - 5.1 mmol/L   Chloride 109 98 - 111 mmol/L   CO2 20 (L) 22 - 32 mmol/L   Glucose, Bld 134 (H) 70 - 99 mg/dL   BUN 25 (H) 8 - 23 mg/dL   Creatinine, Ser 1.62 (H) 0.61 - 1.24 mg/dL   Calcium 8.9 8.9 - 10.3 mg/dL   Total Protein 7.5 6.5 - 8.1 g/dL   Albumin 3.8 3.5 - 5.0 g/dL   AST 14 (L) 15 - 41 U/L   ALT 9 0 - 44 U/L   Alkaline Phosphatase 49 38 - 126 U/L   Total Bilirubin 0.5 0.3 - 1.2 mg/dL   GFR calc non Af Amer 39 (L) >60 mL/min   GFR calc Af Amer 45 (L) >60 mL/min   Anion gap 11 5 - 15    Comment: Performed at [facility], [address] [place], [place] [address]  Lactic acid, plasma     Status: None   Collection Time: 02/04/19  9:45 AM  Result Value Ref Range   Lactic Acid, Venous 1.7 0.5 - 1.9 mmol/L    Comment: Performed at [facility], [address] [place], [place] [address]  Procalcitonin     Status: None   Collection Time: 02/04/19  9:45 AM  Result Value Ref Range   Procalcitonin 0.27 ng/mL    Comment:        Interpretation: PCT (Procalcitonin) <= 0.5 ng/mL: Systemic infection (sepsis) is not likely. Local bacterial infection is possible. (NOTE)       Sepsis PCT Algorithm           Lower Respiratory Tract                                      Infection PCT Algorithm    ----------------------------     ----------------------------         PCT < 0.25 ng/mL                PCT < 0.10 ng/mL         Strongly encourage             Strongly discourage   discontinuation of antibiotics    initiation of antibiotics    ----------------------------     -----------------------------       PCT 0.25 - 0.50 ng/mL            PCT 0.10 - 0.25 ng/mL               OR       >80% decrease in PCT            Discourage initiation of                                             antibiotics      Encourage discontinuation           of antibiotics    ----------------------------     -----------------------------         PCT >= 0.50 ng/mL              PCT 0.26 - 0.50  ng/mL               AND        <80% decrease in PCT             Encourage initiation of                                             antibiotics       Encourage continuation           of antibiotics    ----------------------------     -----------------------------        PCT >= 0.50 ng/mL                  PCT > 0.50 ng/mL               AND         increase in PCT                  Strongly encourage                                      initiation of antibiotics    Strongly encourage escalation           of antibiotics                                     -----------------------------                                           PCT <= 0.25 ng/mL                                                 OR                                        > 80% decrease in PCT                                     Discontinue / Do not initiate                                             antibiotics Performed at [facility], [address] [place], [place] [address]   Protime-INR     Status: None   Collection Time: 02/04/19  9:45 AM  Result Value Ref Range   Prothrombin Time 14.7 11.4 - 15.2 seconds   INR 1.2 0.8 - 1.2    Comment: (NOTE) INR goal varies based on device and disease states. Performed at [facility], [address] [place], [place] [address]   APTT     Status: None  Collection Time: 02/04/19  9:45 AM  Result Value Ref Range   aPTT 34 24 - 36 seconds    Comment: Performed at [facility], [place], [place] [address]  Troponin I (High Sensitivity)     Status: Abnormal   Collection Time: 02/04/19  9:45 AM  Result Value Ref Range   Troponin I (High Sensitivity) 23 (H) <18 ng/L    Comment: (NOTE) Elevated high sensitivity troponin I (hsTnI) values and  significant  changes across serial measurements may suggest ACS but many other  chronic and acute conditions are known to elevate hsTnI results.  Refer to the "Links" section for chest pain algorithms and additional  guidance. Performed at [facility], [address] [place], [place] [address]   Urinalysis, Routine w reflex microscopic     Status: Abnormal   Collection Time: 02/04/19  9:46 AM  Result Value Ref Range   Color, Urine YELLOW (A) YELLOW   APPearance CLEAR (A) CLEAR   Specific Gravity, Urine 1.014 1.005 - 1.030   pH 5.0 5.0 - 8.0   Glucose, UA NEGATIVE NEGATIVE mg/dL   Hgb urine dipstick NEGATIVE NEGATIVE   Bilirubin Urine NEGATIVE NEGATIVE   Ketones, ur NEGATIVE NEGATIVE mg/dL   Protein, ur NEGATIVE NEGATIVE mg/dL   Nitrite NEGATIVE NEGATIVE   Leukocytes,Ua NEGATIVE NEGATIVE    Comment: Performed at [facility], [address] [place], [place] [address]  Urine culture     Status: Abnormal   Collection Time: 02/04/19  9:46 AM   Specimen: In/Out Cath Urine  Result Value Ref Range   Specimen Description      IN/OUT CATH URINE Performed at [facility], [address], [place], [place] [address]    Special Requests      NONE Performed at [facility], [address] [place], [place] [address]    Culture MULTIPLE SPECIES PRESENT, SUGGEST RECOLLECTION (A)    Report Status 02/05/2019 FINAL   SARS CORONAVIRUS 2 (TAT 6-24 HRS) Nasopharyngeal Nasopharyngeal Swab     Status: None   Collection Time: 02/04/19 11:11 AM   Specimen: Nasopharyngeal Swab  Result Value Ref Range   SARS Coronavirus 2 NEGATIVE NEGATIVE    Comment: (NOTE) SARS-CoV-2 target nucleic acids are NOT DETECTED. The SARS-CoV-2 RNA is generally detectable in upper and lower respiratory specimens during the acute phase of infection. Negative results do not preclude SARS-CoV-2 infection, do not rule out co-infections with other pathogens, and should not be used  as the sole basis for treatment or other patient management decisions. Negative results must be combined with clinical observations, patient history, and epidemiological information. The expected result is Negative. Fact Sheet for Patients: SugarRoll.be Fact Sheet for Healthcare Providers: https://www.woods-mathews.com/ This test is not yet approved or cleared by the [place] FDA and  has been authorized for detection and/or diagnosis of SARS-CoV-2 by FDA under an Emergency Use Authorization (EUA). This EUA will remain  in effect (meaning this test can be used) for the duration of the COVID-19 declaration under Section 56 4(b)(1) of the Act, 21 U.S.C. section 360bbb-3(b)(1), unless the authorization is terminated or revoked sooner. Performed at [facility], [place] [address] [place], [place] [address]   Troponin I (High Sensitivity)     Status: Abnormal   Collection Time: 02/04/19 11:12 AM  Result Value Ref Range   Troponin I (High Sensitivity) 21 (H) <18 ng/L    Comment: (NOTE) Elevated high sensitivity troponin I (hsTnI)  values and significant  changes across serial measurements may suggest ACS but many other  chronic and acute conditions are known to elevate hsTnI results.  Refer to the "Links" section for chest pain algorithms and additional  guidance. Performed at [facility], [address] [place], [place] [address]   MRSA PCR Screening     Status: Abnormal   Collection Time: 02/04/19 11:38 AM  Result Value Ref Range   MRSA by PCR POSITIVE (A) NEGATIVE    Comment:        The GeneXpert MRSA Assay (FDA approved for NASAL specimens only), is one component of a comprehensive MRSA colonization surveillance program. It is not intended to diagnose MRSA infection nor to guide or monitor treatment for MRSA infections. RESULT CALLED TO, READ BACK BY AND VERIFIED WITH: NOAH GRIFFITH @1318  ON 02/04/2019 BY  FMW Performed at [facility], [address] [place], [place] [address]   Glucose, capillary     Status: Abnormal   Collection Time: 02/04/19  5:46 PM  Result Value Ref Range   Glucose-Capillary 250 (H) 70 - 99 mg/dL   Comment 1 Notify RN    Comment 2 Document in Chart   Glucose, capillary     Status: Abnormal   Collection Time: 02/04/19  8:59 PM  Result Value Ref Range   Glucose-Capillary 202 (H) 70 - 99 mg/dL  Cortisol     Status: None   Collection Time: 02/05/19  5:34 AM  Result Value Ref Range   Cortisol, Plasma 30.0 ug/dL    Comment: (NOTE) AM    6.7 - 22.6 ug/dL PM   <10.0       ug/dL Performed at [facility] [address], [place], [place] [id number]   Basic metabolic panel     Status: Abnormal   Collection Time: 02/05/19  5:34 AM  Result Value Ref Range   Sodium 138 135 - 145 mmol/L   Potassium 4.7 3.5 - 5.1 mmol/L   Chloride 110 98 - 111 mmol/L   CO2 20 (L) 22 - 32 mmol/L   Glucose, Bld 145 (H) 70 - 99 mg/dL   BUN 28 (H) 8 - 23 mg/dL   Creatinine, Ser 1.68 (H) 0.61 - 1.24 mg/dL   Calcium 8.6 (L) 8.9 - 10.3 mg/dL   GFR calc non Af Amer 37 (L) >60 mL/min   GFR calc Af Amer 43 (L) >60 mL/min   Anion gap 8 5 - 15    Comment: Performed at [facility], [address] [place], [place] [address]  CBC     Status: Abnormal   Collection Time: 02/05/19  5:34 AM  Result Value Ref Range   WBC 13.9 (H) 4.0 - 10.5 K/uL   RBC 3.61 (L) 4.22 - 5.81 MIL/uL   Hemoglobin 10.6 (L) 13.0 - 17.0 g/dL   HCT 33.9 (L) 39.0 - 52.0 %   MCV 93.9 80.0 - 100.0 fL   MCH 29.4 26.0 - 34.0 pg   MCHC 31.3 30.0 - 36.0 g/dL   RDW 13.8 11.5 - 15.5 %   Platelets 182 150 - 400 K/uL   nRBC 0.0 0.0 - 0.2 %    Comment: Performed at [facility], [address] [place], [place] [address]  Glucose, capillary     Status: Abnormal   Collection Time: 02/05/19  8:41 AM  Result Value Ref Range   Glucose-Capillary 146 (H) 70 - 99 mg/dL  Glucose, capillary  Status: Abnormal   Collection Time: 02/05/19 11:42 AM  Result Value Ref Range   Glucose-Capillary 238 (H) 70 - 99 mg/dL  Glucose, capillary     Status: Abnormal   Collection Time: 02/05/19  4:44 PM  Result Value Ref Range   Glucose-Capillary 199 (H) 70 - 99 mg/dL   Dg Chest Port 1 View  Result Date: 02/04/2019 CLINICAL DATA:  Fever.  Shortness of breath. EXAM: PORTABLE CHEST 1 VIEW COMPARISON:  February 23, 2015 FINDINGS: The cardiomediastinal silhouette is stable. Stable pacemaker. The right lung is clear. There is mild opacity in the left mid lower lung. No other acute abnormalities. IMPRESSION: Mild opacity in left lung may represent infection or asymmetric edema. Recommend clinical correlation. Electronically Signed   By: Dorise Bullion III M.D   On: 02/04/2019 09:55    Pending Labs Unresulted Labs (From admission, onward)    Start     Ordered   02/04/19 0935  Lactic acid, plasma  Now then every 2 hours,   STAT     02/04/19 0935          Vitals/Pain Today's Vitals   02/05/19 1235 02/05/19 1351 02/05/19 1508 02/05/19 1540  BP: 105/78  (!) 108/58   Pulse: 80  98   Resp: 17  18   Temp:   98.2 F (36.8 C)   TempSrc:      SpO2: 98%  98%   Weight:      Height:      PainSc: 0-No pain 8   Asleep    Isolation Precautions No active isolations  Medications Medications  lactated ringers infusion ( Intravenous Rate/Dose Change 02/05/19 0737)  cefTRIAXone (ROCEPHIN) 2 g in sodium chloride 0.9 % 100 mL IVPB (0 g Intravenous Stopped 02/04/19 2139)  acetaminophen (TYLENOL) tablet 650 mg (has no administration in time range)    Or  acetaminophen (TYLENOL) suppository 650 mg (has no administration in time range)  ondansetron (ZOFRAN) tablet 4 mg (has no administration in time range)    Or  ondansetron (ZOFRAN) injection 4 mg (has no administration in time range)  simvastatin (ZOCOR) tablet 20 mg (20 mg Oral Given 02/05/19 1713)  sertraline (ZOLOFT) tablet 50 mg (50 mg Oral  Given 02/05/19 1042)  apixaban (ELIQUIS) tablet 2.5 mg (2.5 mg Oral Given 02/05/19 0952)  pantoprazole (PROTONIX) EC tablet 40 mg (40 mg Oral Given 02/05/19 1040)  gabapentin (NEURONTIN) capsule 300 mg (300 mg Oral Given 02/05/19 1041)  insulin aspart (novoLOG) injection 0-9 Units (2 Units Subcutaneous Given 02/05/19 1713)  insulin aspart (novoLOG) injection 0-5 Units (2 Units Subcutaneous Given 02/04/19 2105)  ALPRAZolam (XANAX) tablet 0.25 mg (0.25 mg Oral Given 02/05/19 0138)  doxycycline (VIBRA-TABS) tablet 100 mg (100 mg Oral Given 02/05/19 1040)  hydrocortisone sodium succinate (SOLU-CORTEF) 100 MG injection 50 mg (50 mg Intravenous Given 02/05/19 1432)  ipratropium-albuterol (DUONEB) 0.5-2.5 (3) MG/3ML nebulizer solution 3 mL (3 mLs Nebulization Given 02/05/19 1434)  budesonide (PULMICORT) nebulizer solution 0.5 mg (0.5 mg Nebulization Given 02/05/19 0953)  HYDROcodone-acetaminophen (NORCO) 10-325 MG per tablet 1 tablet (has no administration in time range)  HYDROcodone-acetaminophen (NORCO/VICODIN) 5-325 MG per tablet 1 tablet (1 tablet Oral Given 02/05/19 1437)  lactated ringers bolus 1,000 mL (0 mLs Intravenous Stopped 02/04/19 1111)  acetaminophen (TYLENOL) tablet 1,000 mg (1,000 mg Oral Given 02/04/19 0945)  ceFEPIme (MAXIPIME) 2 g in sodium chloride 0.9 % 100 mL IVPB (0 g Intravenous Stopped 02/04/19 1149)  metroNIDAZOLE (FLAGYL) IVPB 500 mg (0 mg Intravenous Stopped  02/04/19 1111)  vancomycin (VANCOCIN) IVPB 1000 mg/200 mL premix (0 mg Intravenous Stopped 02/04/19 1326)  lactated ringers bolus 1,000 mL (0 mLs Intravenous Stopped 02/04/19 1601)  lactated ringers bolus 500 mL (0 mLs Intravenous Stopped 02/04/19 1603)  lactated ringers bolus 1,000 mL (1,000 mLs Intravenous New Bag/Given 02/04/19 2044)    Mobility walks with device High fall risk   Focused Assessments Pulmonary Assessment Handoff:  Lung sounds: Bilateral Breath Sounds: Expiratory wheezes O2 Device: Room Air         R Recommendations: See Admitting Provider Note  Report given to:   Additional Notes:

## 2019-02-05 NOTE — Evaluation (Signed)
Physical Therapy Evaluation Patient Details Name: Johnathan Hudson MRN: [id number] DOB: 1933/06/01 Today's Date: 02/05/2019   History of Present Illness  Pt is an 85 y.o. male presenting to hospital 02/04/19 with c/o feeling feverish, chills, increased tremors, and increased SOB.  Pt admitted with sepsis with hypotension and chronic a-fib with RVR.  PMH includes essential tremor, a-fib on Eliquis, COPD, DM, CKD, anxiety, RA, htn, ORIF R ankle fx, pacemaker, large hiatal hernia.  Clinical Impression  Prior to hospital admission, pt was independent and driving.  Pt lives alone in 1 level home with 1 step to enter.  No recent h/o falls.  Currently pt is modified independent with bed mobility, CGA with transfers, and CGA with ambulating 25 feet in room no AD (distance ambulated limited d/t pt with IV in place that was attached to the ED bed).  Pt with h/o tremors and pt noted to be tremulous in general during session but pt steady with ambulation and multiple turns d/t small room and short IV attachment (to bed).   Pt would benefit from skilled PT to address noted impairments and functional limitations (see below for any additional details).  Upon hospital discharge, anticipate no further PT needs.    Follow Up Recommendations No PT follow up    Equipment Recommendations  None recommended by PT    Recommendations for Other Services       Precautions / Restrictions Precautions Precautions: Fall Restrictions Weight Bearing Restrictions: No      Mobility  Bed Mobility Overal bed mobility: Modified Independent             General bed mobility comments: Semi-supine to/from sit with mild increased effort  Transfers Overall transfer level: Needs assistance Equipment used: None Transfers: Sit to/from Stand Sit to Stand: Min guard         General transfer comment: fairly strong stand from ER bed  Ambulation/Gait Ambulation/Gait assistance: Min guard Gait Distance (Feet): 25  Feet Assistive device: None   Gait velocity: mildly decreased (anticipate decreased d/t small room)   General Gait Details: partial step through gait pattern; tremulous in general but steady  Stairs            Wheelchair Mobility    Modified Rankin (Stroke Patients Only)       Balance Overall balance assessment: Needs assistance Sitting-balance support: No upper extremity supported;Feet supported Sitting balance-Leahy Scale: Normal Sitting balance - Comments: steady sitting reaching outside BOS   Standing balance support: No upper extremity supported;During functional activity Standing balance-Leahy Scale: Good Standing balance comment: tremulous in general but steady overall with ambulation in ER room  Pt able to stand and use urinal with SBA (pt tremulous but overall steady).                             Pertinent Vitals/Pain Pain Assessment: No/denies pain(although pt does report chronic pain).   Vitals (HR and O2 on room air) stable and WFL throughout treatment session.    Home Living Family/patient expects to be discharged to:: Private residence Living Arrangements: Alone Available Help at Discharge: Family Type of Home: House Home Access: Stairs to enter Entrance Stairs-Rails: None Entrance Stairs-Number of Steps: 1 Home Layout: One level Home Equipment: Grab bars - toilet;Grab bars - tub/shower;Shower seat      Prior Function Level of Independence: Independent         Comments: (+) drives.  Pt reports no recent falls.  Hand Dominance        Extremity/Trunk Assessment   Upper Extremity Assessment Upper Extremity Assessment: Generalized weakness    Lower Extremity Assessment Lower Extremity Assessment: Generalized weakness    Cervical / Trunk Assessment Cervical / Trunk Assessment: Normal  Communication   Communication: HOH  Cognition Arousal/Alertness: Awake/alert Behavior During Therapy: WFL for tasks  assessed/performed Overall Cognitive Status: Within Functional Limits for tasks assessed                                        General Comments   Nursing cleared pt for participation in physical therapy.  Pt agreeable to PT session.  Pt's daughter present during session and answered some questions for pt d/t pt HOH.    Exercises     Assessment/Plan    PT Assessment Patient needs continued PT services  PT Problem List Decreased mobility;Decreased strength;Decreased activity tolerance;Decreased balance       PT Treatment Interventions DME instruction;Gait training;Stair training;Functional mobility training;Therapeutic activities;Therapeutic exercise;Balance training;Patient/family education    PT Goals (Current goals can be found in the Care Plan section)  Acute Rehab PT Goals Patient Stated Goal: to go home PT Goal Formulation: With patient Time For Goal Achievement: 02/19/19 Potential to Achieve Goals: Good    Frequency Min 2X/week   Barriers to discharge        Co-evaluation               AM-PAC PT "6 Clicks" Mobility  Outcome Measure Help needed turning from your back to your side while in a flat bed without using bedrails?: None Help needed moving from lying on your back to sitting on the side of a flat bed without using bedrails?: None Help needed moving to and from a bed to a chair (including a wheelchair)?: A Little Help needed standing up from a chair using your arms (e.g., wheelchair or bedside chair)?: A Little Help needed to walk in hospital room?: A Little Help needed climbing 3-5 steps with a railing? : A Little 6 Click Score: 20    End of Session Equipment Utilized During Treatment: Gait belt Activity Tolerance: Patient tolerated treatment well Patient left: in bed;with call bell/phone within reach;with nursing/sitter in room Nurse Communication: Mobility status;Precautions PT Visit Diagnosis: Other abnormalities of gait and  mobility (R26.89);Muscle weakness (generalized) (M62.81)    Time: [id number] PT Time Calculation (min) (ACUTE ONLY): 23 min   Charges:   PT Evaluation $PT Eval Low Complexity: 1 Low          [place], PT 02/05/19, 3:31 PM [phone]

## 2019-02-05 NOTE — ED Notes (Signed)
Patient ate approx. 50% of his lunch.  Will continue to monitor.

## 2019-02-06 ENCOUNTER — Inpatient Hospital Stay: Payer: Medicare HMO

## 2019-02-06 DIAGNOSIS — F419 Anxiety disorder, unspecified: Secondary | ICD-10-CM

## 2019-02-06 DIAGNOSIS — I482 Chronic atrial fibrillation, unspecified: Secondary | ICD-10-CM

## 2019-02-06 DIAGNOSIS — R251 Tremor, unspecified: Secondary | ICD-10-CM

## 2019-02-06 DIAGNOSIS — F329 Major depressive disorder, single episode, unspecified: Secondary | ICD-10-CM

## 2019-02-06 LAB — GLUCOSE, CAPILLARY
Glucose-Capillary: 151 mg/dL — ABNORMAL HIGH (ref 70–99)
Glucose-Capillary: 168 mg/dL — ABNORMAL HIGH (ref 70–99)
Glucose-Capillary: 101 mg/dL — ABNORMAL HIGH (ref 70–99)
Glucose-Capillary: 93 mg/dL (ref 70–99)

## 2019-02-06 LAB — BASIC METABOLIC PANEL
Anion gap: 10 (ref 5–15)
BUN: 29 mg/dL — ABNORMAL HIGH (ref 8–23)
CO2: 21 mmol/L — ABNORMAL LOW (ref 22–32)
Calcium: 8.9 mg/dL (ref 8.9–10.3)
Chloride: 109 mmol/L (ref 98–111)
Creatinine, Ser: 1.92 mg/dL — ABNORMAL HIGH (ref 0.61–1.24)
GFR calc Af Amer: 36 mL/min — ABNORMAL LOW (ref >60)
GFR calc non Af Amer: 31 mL/min — ABNORMAL LOW (ref >60)
Glucose, Bld: 168 mg/dL — ABNORMAL HIGH (ref 70–99)
Potassium: 4.9 mmol/L (ref 3.5–5.1)
Sodium: 140 mmol/L (ref 135–145)

## 2019-02-06 MED ORDER — LORAZEPAM 0.5 MG PO TABS
0.5000 mg | ORAL_TABLET | Freq: Two times a day (BID) | ORAL | Status: DC
  Administered 2019-02-06 – 2019-02-07 (×3): 0.5 mg via ORAL
  Filled 2019-02-06 (×3): qty 1

## 2019-02-06 MED ORDER — PROPRANOLOL HCL 20 MG PO TABS
20.0000 mg | ORAL_TABLET | Freq: Three times a day (TID) | ORAL | Status: DC
  Administered 2019-02-06 – 2019-02-07 (×4): 20 mg via ORAL
  Filled 2019-02-06 (×7): qty 1

## 2019-02-06 MED ORDER — IPRATROPIUM-ALBUTEROL 0.5-2.5 (3) MG/3ML IN SOLN
3.0000 mL | Freq: Two times a day (BID) | RESPIRATORY_TRACT | Status: DC
  Administered 2019-02-06 – 2019-02-07 (×2): 3 mL via RESPIRATORY_TRACT
  Filled 2019-02-06 (×2): qty 3

## 2019-02-06 MED ORDER — MUPIROCIN 2 % EX OINT
1.0000 "application " | TOPICAL_OINTMENT | Freq: Two times a day (BID) | CUTANEOUS | Status: DC
  Administered 2019-02-06 – 2019-02-07 (×3): 1 via NASAL
  Filled 2019-02-06: qty 22

## 2019-02-06 MED ORDER — METHENAMINE MANDELATE 1 G PO TABS
1000.0000 mg | ORAL_TABLET | Freq: Two times a day (BID) | ORAL | Status: DC
  Administered 2019-02-06 – 2019-02-07 (×3): 1000 mg via ORAL
  Filled 2019-02-06 (×5): qty 1

## 2019-02-06 MED ORDER — CHLORHEXIDINE GLUCONATE CLOTH 2 % EX PADS: 6.0000 | MEDICATED_PAD | Freq: Every day | CUTANEOUS | Status: DC

## 2019-02-06 MED ORDER — HYDROCORTISONE NA SUCCINATE PF 100 MG IJ SOLR
50.0000 mg | Freq: Two times a day (BID) | INTRAMUSCULAR | Status: DC
  Administered 2019-02-06 – 2019-02-07 (×2): 50 mg via INTRAVENOUS
  Filled 2019-02-06 (×3): qty 1

## 2019-02-06 MED ORDER — SENNOSIDES-DOCUSATE SODIUM 8.6-50 MG PO TABS
1.0000 | ORAL_TABLET | Freq: Every day | ORAL | Status: DC
  Administered 2019-02-06 – 2019-02-07 (×2): 1 via ORAL
  Filled 2019-02-06 (×2): qty 1

## 2019-02-06 NOTE — Care Management Important Message (Signed)
Important Message  Patient Details  Name: Johnathan Hudson MRN: [id number] Date of Birth: January 03, 1934   Medicare Important Message Given:  Yes     Dannette Barbara 02/06/2019, 4:34 PM

## 2019-02-06 NOTE — Progress Notes (Signed)
Patient ID: Johnathan Hudson, male   DOB: [birth year], 85 y.o.   MRN: [id number] [facility] PROGRESS NOTE  Johnathan Hudson [id number] DOB: [birth year] DOA: 02/04/2019 PCP: Tracie Harrier, MD  HPI/Subjective: Patient has had a longstanding tremor but it seems to be worse at this time.  He could hardly feed himself this morning.  States his breathing is horrible.  He has had some cough and some wheezing.  Objective: Vitals:   02/06/19 0716 02/06/19 0753  BP: 121/73   Pulse: 86   Resp: 19   Temp: 97.6 F (36.4 C)   SpO2: 94% 94%    Intake/Output Summary (Last 24 hours) at 02/06/2019 1355 Last data filed at 02/06/2019 0900 Gross per 24 hour  Intake 2052.35 ml  Output 225 ml  Net 1827.35 ml   Filed Weights   02/04/19 0903 02/05/19 2009 02/06/19 0434  Weight: 73.5 kg 76.1 kg 76.1 kg    ROS: Review of Systems  Constitutional: Negative for chills and fever.  Eyes: Negative for blurred vision.  Respiratory: Positive for cough, shortness of breath and wheezing.   Cardiovascular: Negative for chest pain.  Gastrointestinal: Negative for abdominal pain, constipation, diarrhea, nausea and vomiting.  Genitourinary: Negative for dysuria.  Musculoskeletal: Negative for joint pain.  Neurological: Positive for tremors. Negative for dizziness and headaches.   Exam: Physical Exam  Constitutional: He is oriented to person, place, and time.  HENT:  Nose: No mucosal edema.  Mouth/Throat: No oropharyngeal exudate or posterior oropharyngeal edema.  Eyes: Pupils are equal, round, and reactive to light. Conjunctivae, EOM and lids are normal.  Neck: No JVD present. Carotid bruit is not present. No edema present. No thyroid mass and no thyromegaly present.  Cardiovascular: S1 normal, S2 normal and normal heart sounds. An irregularly irregular rhythm present. Exam reveals no gallop.  No murmur heard. Pulses:      Dorsalis pedis pulses are 2+ on the right side and 2+ on the left side.   Respiratory: No respiratory distress. He has decreased breath sounds in the right lower field and the left lower field. He has wheezes in the right middle field, the right lower field, the left middle field and the left lower field. He has no rhonchi. He has no rales.  GI: Soft. Bowel sounds are normal. There is no abdominal tenderness.  Musculoskeletal:     Right ankle: He exhibits no swelling.     Left ankle: He exhibits no swelling.  Lymphadenopathy:    He has no cervical adenopathy.  Neurological: He is alert and oriented to person, place, and time. No cranial nerve deficit.  Positive tremor worse on the right arm than left arm.  Skin: Skin is warm. No rash noted. Nails show no clubbing.  Psychiatric: He has a normal mood and affect.      Data Reviewed: Basic Metabolic Panel: Recent Labs  Lab 02/04/19 0945 02/05/19 0534 02/06/19 0739  NA 140 138 140  K 4.7 4.7 4.9  CL 109 110 109  CO2 20* 20* 21*  GLUCOSE 134* 145* 168*  BUN 25* 28* 29*  CREATININE 1.62* 1.68* 1.92*  CALCIUM 8.9 8.6* 8.9   Liver Function Tests: Recent Labs  Lab 02/04/19 0945  AST 14*  ALT 9  ALKPHOS 49  BILITOT 0.5  PROT 7.5  ALBUMIN 3.8   CBC: Recent Labs  Lab 02/04/19 0921 02/05/19 0534  WBC 12.5* 13.9*  NEUTROABS 10.9*  --   HGB 13.5 10.6*  HCT 41.2 33.9*  MCV  90.4 93.9  PLT 223 182     CBG: Recent Labs  Lab 02/05/19 1142 02/05/19 1644 02/05/19 2256 02/06/19 0755 02/06/19 1211  GLUCAP 238* 199* 176* 151* 168*    Recent Results (from the past 240 hour(s))  Blood Culture (routine x 2)     Status: None (Preliminary result)   Collection Time: 02/04/19  9:41 AM   Specimen: BLOOD  Result Value Ref Range Status   Specimen Description BLOOD LEFT FA  Final   Special Requests   Final    BOTTLES DRAWN AEROBIC AND ANAEROBIC Blood Culture results may not be optimal due to an excessive volume of blood received in culture bottles   Culture   Final    NO GROWTH 2 DAYS Performed at  [facility], [address] [place], [place] [address]    Report Status PENDING  Incomplete  Blood Culture (routine x 2)     Status: None (Preliminary result)   Collection Time: 02/04/19  9:41 AM   Specimen: BLOOD  Result Value Ref Range Status   Specimen Description BLOOD RIGHT HAND  Final   Special Requests   Final    BOTTLES DRAWN AEROBIC AND ANAEROBIC Blood Culture results may not be optimal due to an excessive volume of blood received in culture bottles   Culture   Final    NO GROWTH 2 DAYS Performed at [facility], [address] [place], [place] [address]    Report Status PENDING  Incomplete  Urine culture     Status: Abnormal   Collection Time: 02/04/19  9:46 AM   Specimen: In/Out Cath Urine  Result Value Ref Range Status   Specimen Description   Final    IN/OUT CATH URINE Performed at [facility], [address], [place], [place] [address]    Special Requests   Final    NONE Performed at [facility], [address] [place], [place] [address]    Culture MULTIPLE SPECIES PRESENT, SUGGEST RECOLLECTION (A)  Final   Report Status 02/05/2019 FINAL  Final  SARS CORONAVIRUS 2 (TAT 6-24 HRS) Nasopharyngeal Nasopharyngeal Swab     Status: None   Collection Time: 02/04/19 11:11 AM   Specimen: Nasopharyngeal Swab  Result Value Ref Range Status   SARS Coronavirus 2 NEGATIVE NEGATIVE Final    Comment: (NOTE) SARS-CoV-2 target nucleic acids are NOT DETECTED. The SARS-CoV-2 RNA is generally detectable in upper and lower respiratory specimens during the acute phase of infection. Negative results do not preclude SARS-CoV-2 infection, do not rule out co-infections with other pathogens, and should not be used as the sole basis for treatment or other patient management decisions. Negative results must be combined with clinical observations, patient history, and epidemiological information. The expected result is Negative. Fact Sheet for  Patients: SugarRoll.be Fact Sheet for Healthcare Providers: https://www.woods-mathews.com/ This test is not yet approved or cleared by the [place] FDA and  has been authorized for detection and/or diagnosis of SARS-CoV-2 by FDA under an Emergency Use Authorization (EUA). This EUA will remain  in effect (meaning this test can be used) for the duration of the COVID-19 declaration under Section 56 4(b)(1) of the Act, 21 U.S.C. section 360bbb-3(b)(1), unless the authorization is terminated or revoked sooner. Performed at [facility], [place] [address] [place], [place] [address]   MRSA PCR Screening     Status: Abnormal   Collection Time: 02/04/19 11:38 AM  Result Value Ref Range Status   MRSA by  PCR POSITIVE (A) NEGATIVE Final    Comment:        The GeneXpert MRSA Assay (FDA approved for NASAL specimens only), is one component of a comprehensive MRSA colonization surveillance program. It is not intended to diagnose MRSA infection nor to guide or monitor treatment for MRSA infections. RESULT CALLED TO, READ BACK BY AND VERIFIED WITH: NOAH GRIFFITH @1318  ON 02/04/2019 BY FMW Performed at [facility], [address] [place], [place] [address]      Studies: Dg Chest 2 View  Result Date: 02/06/2019 CLINICAL DATA:  Cough, fever and shortness of breath. EXAM: CHEST - 2 VIEW COMPARISON:  02/04/2019 FINDINGS: The patient has taken a better inspiration today. There is cardiomegaly. Pacemaker remains in place. The lungs appear better on today's study which could be due to the better inspiration or there could be improvement in mild edema or infiltrates. No worsening or new finding. Tiny effusions in the posterior costophrenic angles. IMPRESSION: Radiographic improvement since 2 days ago. Better inspiration. The lungs appear essentially clear. Small effusions in the posterior costophrenic angles. Electronically Signed   By: Nelson Chimes M.D.   On: 02/06/2019 09:21    Scheduled Meds: . apixaban  2.5 mg Oral BID  . budesonide (PULMICORT) nebulizer solution  0.5 mg Nebulization BID  . doxycycline  100 mg Oral Q12H  . gabapentin  300 mg Oral BID  . hydrocortisone sod succinate (SOLU-CORTEF) inj  50 mg Intravenous Q12H  . insulin aspart  0-5 Units Subcutaneous QHS  . insulin aspart  0-9 Units Subcutaneous TID WC  . ipratropium-albuterol  3 mL Nebulization BID  . LORazepam  0.5 mg Oral BID  . methenamine  1,000 mg Oral BID  . mupirocin ointment  1 application Nasal BID  . pantoprazole  40 mg Oral Daily  . propranolol  20 mg Oral TID  . senna-docusate  1 tablet Oral Daily  . sertraline  50 mg Oral Daily  . simvastatin  20 mg Oral q1800   Continuous Infusions: . cefTRIAXone (ROCEPHIN)  IV Stopped (02/06/19 0107)    Assessment/Plan:   1. Clinical sepsis (present on admission) with hypotension and pneumonia lobar left lung.  Hypotension has resolved.  Continue Rocephin and doxycycline.  Blood cultures negative.  Stress dose steroids tapered.  Repeat chest x-ray did not show pneumonia.  COVID-19 testing negative. 2. Chronic atrial fibrillation.   Heart rate better with IV fluids.  On Eliquis for anticoagulation.  Started propranolol for tremor. 3. 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  Patient on sliding scale insulin.  Hold glipizide.  Continue to monitor creatinine. 4. COPD exacerbation.  Taper stress dose steroids.  Continue nebulizers. 5. Tremor.  Start propranolol.  Patient's daughter states the patient also takes Ativan which I restarted. 6. Diabetic neuropathy on Neurontin 7. Depression on Zoloft 8. Weakness physical therapy evaluation  Code Status:     Code Status Orders  (From admission, onward)         Start     Ordered   02/04/19 1153  Do not attempt resuscitation (DNR)  Continuous    Question Answer Comment  In the event of cardiac or respiratory ARREST Do not call a  "code blue"   In the event of cardiac or respiratory ARREST Do not perform Intubation, CPR, defibrillation or ACLS   In the event of cardiac or respiratory ARREST Use medication by any route, position, wound care, and other measures to relive pain and suffering.  May use oxygen, suction and manual treatment of airway obstruction as needed for comfort.   Comments nurse may pronounce      02/04/19 1153        Code Status History    Date Active Date Inactive Code Status Order ID Comments User Context   02/05/2017 1246 02/05/2017 1708 Full Code [id number]  Royston Cowper, MD Inpatient   03/01/2015 1315 03/01/2015 1740 Full Code [id number]  Isaias Cowman, MD Inpatient   Advance Care Planning Activity     Family Communication: Spoke with daughter at the bedside Disposition Plan: Needs to breathe better prior to disposition.  Antibiotics:  Rocephin  Doxycycline  Time spent: 27 minutes  [place]

## 2019-02-06 NOTE — Progress Notes (Signed)
Pt very tearful this morning saying his tremors are getting worse and he can't even feed or drink for himself they're so bad. Asking for something to "make them stop or slow down". Also asking for something to "boost my mood".

## 2019-02-07 LAB — GLUCOSE, CAPILLARY
Glucose-Capillary: 95 mg/dL (ref 70–99)
Glucose-Capillary: 191 mg/dL — ABNORMAL HIGH (ref 70–99)

## 2019-02-07 MED ORDER — SPIRIVA HANDIHALER 18 MCG IN CAPS: 18.0000 ug | ORAL_CAPSULE | Freq: Every day | RESPIRATORY_TRACT | 0 refills | Status: AC

## 2019-02-07 MED ORDER — ALBUTEROL SULFATE HFA 108 (90 BASE) MCG/ACT IN AERS: 2.0000 | INHALATION_SPRAY | Freq: Four times a day (QID) | RESPIRATORY_TRACT | 0 refills | Status: AC | PRN

## 2019-02-07 MED ORDER — LORAZEPAM 0.5 MG PO TABS: 0.5000 mg | ORAL_TABLET | Freq: Two times a day (BID) | ORAL | 0 refills | Status: AC

## 2019-02-07 MED ORDER — DOXYCYCLINE HYCLATE 100 MG PO TABS: 100.0000 mg | ORAL_TABLET | Freq: Two times a day (BID) | ORAL | 0 refills | Status: DC

## 2019-02-07 MED ORDER — BUDESONIDE-FORMOTEROL FUMARATE 80-4.5 MCG/ACT IN AERO: 2.0000 | INHALATION_SPRAY | Freq: Two times a day (BID) | RESPIRATORY_TRACT | 0 refills | Status: AC

## 2019-02-07 MED ORDER — PROPRANOLOL HCL 20 MG PO TABS: 20.0000 mg | ORAL_TABLET | Freq: Three times a day (TID) | ORAL | 0 refills | Status: DC

## 2019-02-07 MED ORDER — PREDNISONE 10 MG PO TABS: ORAL_TABLET | ORAL | 0 refills | Status: AC

## 2019-02-07 MED ORDER — MUPIROCIN 2 % EX OINT: 1.0000 "application " | TOPICAL_OINTMENT | Freq: Two times a day (BID) | CUTANEOUS | 0 refills | Status: AC

## 2019-02-07 NOTE — Discharge Summary (Signed)
[facility] NAME: Johnathan Hudson    MR#:  [id number]  DATE OF BIRTH:  03-16-1934  DATE OF ADMISSION:  02/04/2019 ADMITTING PHYSICIAN: Loletha Grayer, MD  DATE OF DISCHARGE: 02/07/2019  PRIMARY CARE PHYSICIAN: Tracie Harrier, MD    ADMISSION DIAGNOSIS:  Atrial fibrillation with RVR ([place]) [I48.91] Community acquired pneumonia, unspecified laterality [J18.9] Sepsis without acute organ dysfunction, due to unspecified organism ([place]) [A41.9] Sepsis ([place]) [A41.9]  DISCHARGE DIAGNOSIS:  Active Problems:   Sepsis ([place])   Atrial fibrillation, chronic ([place])   Tremor   Anxiety and depression   SECONDARY DIAGNOSIS:   Past Medical History:  Diagnosis Date  . Anxiety   . Arthritis    rheumatoid  . Benign essential tremor   . Cardiomyopathy ([place])    mild  . Chronic kidney disease    kidney stones  . COPD (chronic obstructive pulmonary disease) ([place])   . Diabetes mellitus without complication ([place])   . Dysrhythmia    atrial fib  . Episodic atrial fibrillation ([place])   . Hyperlipemia   . Hypertension   . Lumbar stenosis with neurogenic claudication   . Osteoarthritis   . Psoriasis   . Renal insufficiency     HOSPITAL COURSE:   1.  Clinical sepsis, present on admission.  Patient had hypertension and lobar pneumonia of the left lung.  Hypotension had resolved with IV fluid hydration.  Patient was given Rocephin and doxycycline in the hospital.  Patient was given stress dose steroids.  Repeat chest x-ray did not comment on pneumonia.  COVID-19 testing negative.  Patient will be discharged on a few pills of doxycycline to go home with to finish the course. 2.  Chronic atrial fibrillation.  Patient had rapid ventricular response when he came into the hospital and improved with IV fluid hydration.  Patient already on Eliquis for anticoagulation.  I started propranolol for tremor but this will also help out with heart rate. 3.  Type  2 diabetes mellitus with chronic kidney disease stage IIIb.  We will continue to hold glipizide XL because his sugars were good even though he was on steroids.  When he has chronic kidney disease I do not want this medication to stick around too long and cause hypoglycemia.  I spoke with the patient's daughter about holding glipizide.  Will need diet control. 4.  COPD exacerbation.  Patient was given stress dose steroids.  I continued nebulizers during hospital course.  Upon discharge he was moving a lot better air, breathing better.  Only heard slight expiratory wheeze.  Continue 2 more days of prednisone.  Prescribed inhalers at home. 5.  Tremor.  I started low-dose propranolol.  Patient also takes Ativan at home. 6.  Diabetic neuropathy on Neurontin 7.  Depression and anxiety on Ativan and Zoloft.  I prescribed 10 pills of Ativan upon going home.  DISCHARGE CONDITIONS:   Satisfactory  CONSULTS OBTAINED:  None  DRUG ALLERGIES:   Allergies  Allergen Reactions  . Sulfa Antibiotics Rash  . Lipitor [Atorvastatin] Other (See Comments)    Myalgia    DISCHARGE MEDICATIONS:   Allergies as of 02/07/2019      Reactions   Sulfa Antibiotics Rash   Lipitor [atorvastatin] Other (See Comments)   Myalgia      Medication List    STOP taking these medications   glipiZIDE 2.5 MG 24 hr tablet Commonly known as: GLUCOTROL XL   levofloxacin 500 MG tablet Commonly known as:  LEVAQUIN   oxyCODONE-acetaminophen 5-325 MG tablet Commonly known as: PERCOCET/ROXICET   quinapril 10 MG tablet Commonly known as: ACCUPRIL     TAKE these medications   acetaminophen 500 MG tablet Commonly known as: TYLENOL Take 1,000 mg 3 (three) times daily as needed by mouth for moderate pain.   albuterol 108 (90 Base) MCG/ACT inhaler Commonly known as: VENTOLIN HFA Inhale 2 puffs into the lungs every 6 (six) hours as needed for wheezing or shortness of breath.   ALPRAZolam 0.25 MG tablet Commonly known as:  XANAX Take 0.25 mg by mouth at bedtime as needed for sleep.   budesonide-formoterol 80-4.5 MCG/ACT inhaler Commonly known as: Symbicort Inhale 2 puffs into the lungs 2 (two) times daily.   doxycycline 100 MG tablet Commonly known as: VIBRA-TABS Take 1 tablet (100 mg total) by mouth every 12 (twelve) hours for 7 doses.   Eliquis 2.5 MG Tabs tablet Generic drug: apixaban Take 2.5 mg by mouth 2 (two) times daily.   gabapentin 300 MG capsule Commonly known as: NEURONTIN Take 300 mg by mouth 2 (two) times daily.   HYDROcodone-acetaminophen 7.5-325 MG tablet Commonly known as: NORCO Take 1 tablet by mouth 3 (three) times daily as needed for pain.   LORazepam 0.5 MG tablet Commonly known as: ATIVAN Take 1 tablet (0.5 mg total) by mouth 2 (two) times daily. What changed:   when to take this  reasons to take this   methenamine 1 g tablet Commonly known as: HIPREX Take 1 g by mouth 2 (two) times daily with a meal.   mupirocin ointment 2 % Commonly known as: BACTROBAN Place 1 application into the nose 2 (two) times daily for 5 days.   omeprazole 20 MG capsule Commonly known as: PRILOSEC Take 20 mg by mouth 2 (two) times daily before a meal.   predniSONE 10 MG tablet Commonly known as: DELTASONE 4 tabs po daily for two days then stop Start taking on: February 08, 2019   propranolol 20 MG tablet Commonly known as: INDERAL Take 1 tablet (20 mg total) by mouth 3 (three) times daily.   sertraline 50 MG tablet Commonly known as: ZOLOFT Take 50 mg by mouth daily.   simvastatin 20 MG tablet Commonly known as: ZOCOR Take 20 mg by mouth every evening.   Spiriva HandiHaler 18 MCG inhalation capsule Generic drug: tiotropium Place 1 capsule (18 mcg total) into inhaler and inhale daily.        DISCHARGE INSTRUCTIONS:   Follow-up PMD 5 days  If you experience worsening of your admission symptoms, develop shortness of breath, life threatening emergency, suicidal or  homicidal thoughts you must seek medical attention immediately by calling 911 or calling your MD immediately  if symptoms less severe.  You Must read complete instructions/literature along with all the possible adverse reactions/side effects for all the Medicines you take and that have been prescribed to you. Take any new Medicines after you have completely understood and accept all the possible adverse reactions/side effects.   Please note  You were cared for by a hospitalist during your hospital stay. If you have any questions about your discharge medications or the care you received while you were in the hospital after you are discharged, you can call the unit and asked to speak with the hospitalist on call if the hospitalist that took care of you is not available. Once you are discharged, your primary care physician will handle any further medical issues. Please note that NO REFILLS for any  discharge medications will be authorized once you are discharged, as it is imperative that you return to your primary care physician (or establish a relationship with a primary care physician if you do not have one) for your aftercare needs so that they can reassess your need for medications and monitor your lab values.    Today   CHIEF COMPLAINT:   Chief Complaint  Patient presents with  . Fever    HISTORY OF PRESENT ILLNESS:  Johnathan Hudson  is a 85 y.o. male came in with fever and shortness of breath   VITAL SIGNS:  Blood pressure 135/80, pulse 67, temperature 97.9 F (36.6 C), temperature source Oral, resp. rate 18, height 5\' 6"  (1.676 m), weight 76.2 kg, SpO2 92 %.   PHYSICAL EXAMINATION:  GENERAL:  85 y.o.-year-old patient lying in the bed with no acute distress.  EYES: Pupils equal, round, reactive to light and accommodation. No scleral icterus. Extraocular muscles intact.  HEENT: Head atraumatic, normocephalic. Oropharynx and nasopharynx clear.  NECK:  Supple, no jugular venous  distention. No thyroid enlargement, no tenderness.  LUNGS: Decreased breath sounds bilaterally, slight expiratory wheezing at the bases.  No rales,rhonchi or crepitation. No use of accessory muscles of respiration.  CARDIOVASCULAR: S1, S2 normal. No murmurs, rubs, or gallops.  ABDOMEN: Soft, non-tender, non-distended. Bowel sounds present. No organomegaly or mass.  EXTREMITIES: No pedal edema, cyanosis, or clubbing.  NEUROLOGIC: Cranial nerves II through XII are intact. Muscle strength 5/5 in all extremities. Sensation intact. Gait not checked.  PSYCHIATRIC: The patient is alert and oriented x 3.  SKIN: No obvious rash, lesion, or ulcer.   DATA REVIEW:   CBC Recent Labs  Lab 02/05/19 0534  WBC 13.9*  HGB 10.6*  HCT 33.9*  PLT 182    Chemistries  Recent Labs  Lab 02/04/19 0945  02/06/19 0739  NA 140   < > 140  K 4.7   < > 4.9  CL 109   < > 109  CO2 20*   < > 21*  GLUCOSE 134*   < > 168*  BUN 25*   < > 29*  CREATININE 1.62*   < > 1.92*  CALCIUM 8.9   < > 8.9  AST 14*  --   --   ALT 9  --   --   ALKPHOS 49  --   --   BILITOT 0.5  --   --    < > = values in this interval not displayed.    Microbiology Results  Results for orders placed or performed during the hospital encounter of 02/04/19  Blood Culture (routine x 2)     Status: None (Preliminary result)   Collection Time: 02/04/19  9:41 AM   Specimen: BLOOD  Result Value Ref Range Status   Specimen Description BLOOD LEFT FA  Final   Special Requests   Final    BOTTLES DRAWN AEROBIC AND ANAEROBIC Blood Culture results may not be optimal due to an excessive volume of blood received in culture bottles   Culture   Final    NO GROWTH 3 DAYS Performed at [facility], [address], [place], [place] [address]    Report Status PENDING  Incomplete  Blood Culture (routine x 2)     Status: None (Preliminary result)   Collection Time: 02/04/19  9:41 AM   Specimen: BLOOD  Result Value Ref Range Status    Specimen Description BLOOD RIGHT HAND  Final   Special Requests  Final    BOTTLES DRAWN AEROBIC AND ANAEROBIC Blood Culture results may not be optimal due to an excessive volume of blood received in culture bottles   Culture   Final    NO GROWTH 3 DAYS Performed at [facility], [address], [place], [place] [address]    Report Status PENDING  Incomplete  Urine culture     Status: Abnormal   Collection Time: 02/04/19  9:46 AM   Specimen: In/Out Cath Urine  Result Value Ref Range Status   Specimen Description   Final    IN/OUT CATH URINE Performed at [facility], [address], [place], [place] [address]    Special Requests   Final    NONE Performed at [facility], [address] [place], [place] [address]    Culture MULTIPLE SPECIES PRESENT, SUGGEST RECOLLECTION (A)  Final   Report Status 02/05/2019 FINAL  Final  SARS CORONAVIRUS 2 (TAT 6-24 HRS) Nasopharyngeal Nasopharyngeal Swab     Status: None   Collection Time: 02/04/19 11:11 AM   Specimen: Nasopharyngeal Swab  Result Value Ref Range Status   SARS Coronavirus 2 NEGATIVE NEGATIVE Final    Comment: (NOTE) SARS-CoV-2 target nucleic acids are NOT DETECTED. The SARS-CoV-2 RNA is generally detectable in upper and lower respiratory specimens during the acute phase of infection. Negative results do not preclude SARS-CoV-2 infection, do not rule out co-infections with other pathogens, and should not be used as the sole basis for treatment or other patient management decisions. Negative results must be combined with clinical observations, patient history, and epidemiological information. The expected result is Negative. Fact Sheet for Patients: SugarRoll.be Fact Sheet for Healthcare Providers: https://www.woods-mathews.com/ This test is not yet approved or cleared by the [place] FDA and  has been authorized for detection and/or diagnosis of  SARS-CoV-2 by FDA under an Emergency Use Authorization (EUA). This EUA will remain  in effect (meaning this test can be used) for the duration of the COVID-19 declaration under Section 56 4(b)(1) of the Act, 21 U.S.C. section 360bbb-3(b)(1), unless the authorization is terminated or revoked sooner. Performed at [facility], [place] [address] [place], [place] [address]   MRSA PCR Screening     Status: Abnormal   Collection Time: 02/04/19 11:38 AM  Result Value Ref Range Status   MRSA by PCR POSITIVE (A) NEGATIVE Final    Comment:        The GeneXpert MRSA Assay (FDA approved for NASAL specimens only), is one component of a comprehensive MRSA colonization surveillance program. It is not intended to diagnose MRSA infection nor to guide or monitor treatment for MRSA infections. RESULT CALLED TO, READ BACK BY AND VERIFIED WITH: NOAH GRIFFITH @1318  ON 02/04/2019 BY FMW Performed at [facility], [address] [place], [place] [address]     RADIOLOGY:  Dg Chest 2 View  Result Date: 02/06/2019 CLINICAL DATA:  Cough, fever and shortness of breath. EXAM: CHEST - 2 VIEW COMPARISON:  02/04/2019 FINDINGS: The patient has taken a better inspiration today. There is cardiomegaly. Pacemaker remains in place. The lungs appear better on today's study which could be due to the better inspiration or there could be improvement in mild edema or infiltrates. No worsening or new finding. Tiny effusions in the posterior costophrenic angles. IMPRESSION: Radiographic improvement since 2 days ago. Better inspiration. The lungs appear essentially clear. Small effusions in the posterior costophrenic angles. Electronically Signed   By: Jan Fireman.D.  On: 02/06/2019 09:21     Management plans discussed with the patient, family and they are in agreement.  CODE STATUS:     Code Status Orders  (From admission, onward)         Start     Ordered   02/04/19 1153  Do not attempt  resuscitation (DNR)  Continuous    Question Answer Comment  In the event of cardiac or respiratory ARREST Do not call a "code blue"   In the event of cardiac or respiratory ARREST Do not perform Intubation, CPR, defibrillation or ACLS   In the event of cardiac or respiratory ARREST Use medication by any route, position, wound care, and other measures to relive pain and suffering. May use oxygen, suction and manual treatment of airway obstruction as needed for comfort.   Comments nurse may pronounce      02/04/19 1153        Code Status History    Date Active Date Inactive Code Status Order ID Comments User Context   02/05/2017 1246 02/05/2017 1708 Full Code [id number]  Royston Cowper, MD Inpatient   03/01/2015 1315 03/01/2015 1740 Full Code [id number]  Isaias Cowman, MD Inpatient   Advance Care Planning Activity      TOTAL TIME TAKING CARE OF THIS PATIENT: 35 minutes.    Loletha Grayer M.D on 02/07/2019 at 3:56 PM  Between 7am to 6pm - Pager - [phone]  After 6pm go to [email or web address] - password [email or web address]  [facility]  CC: Primary care physician; Tracie Harrier, MD

## 2019-02-07 NOTE — Discharge Instructions (Signed)
Community-Acquired Pneumonia, Adult °Pneumonia is an infection of the lungs. It causes swelling in the airways of the lungs. Mucus and fluid may also build up inside the airways. °One type of pneumonia can happen while a person is in a hospital. A different type can happen when a person is not in a hospital (community-acquired pneumonia).  °What are the causes? ° °This condition is caused by germs (viruses, bacteria, or fungi). Some types of germs can be passed from one person to another. This can happen when you breathe in droplets from the cough or sneeze of an infected person. °What increases the risk? °You are more likely to develop this condition if you: °· Have a long-term (chronic) disease, such as: °? Chronic obstructive pulmonary disease (COPD). °? Asthma. °? Cystic fibrosis. °? Congestive heart failure. °? Diabetes. °? Kidney disease. °· Have HIV. °· Have sickle cell disease. °· Have had your spleen removed. °· Do not take good care of your teeth and mouth (poor dental hygiene). °· Have a medical condition that increases the risk of breathing in droplets from your own mouth and nose. °· Have a weakened body defense system (immune system). °· Are a smoker. °· Travel to areas where the germs that cause this illness are common. °· Are around certain animals or the places they live. °What are the signs or symptoms? °· A dry cough. °· A wet (productive) cough. °· Fever. °· Sweating. °· Chest pain. This often happens when breathing deeply or coughing. °· Fast breathing or trouble breathing. °· Shortness of breath. °· Shaking chills. °· Feeling tired (fatigue). °· Muscle aches. °How is this treated? °Treatment for this condition depends on many things. Most adults can be treated at home. In some cases, treatment must happen in a hospital. Treatment may include: °· Medicines given by mouth or through an IV tube. °· Being given extra oxygen. °· Respiratory therapy. °In rare cases, treatment for very bad pneumonia  may include: °· Using a machine to help you breathe. °· Having a procedure to remove fluid from around your lungs. °Follow these instructions at home: °Medicines °· Take over-the-counter and prescription medicines only as told by your doctor. °? Only take cough medicine if you are losing sleep. °· If you were prescribed an antibiotic medicine, take it as told by your doctor. Do not stop taking the antibiotic even if you start to feel better. °General instructions ° °· Sleep with your head and neck raised (elevated). You can do this by sleeping in a recliner or by putting a few pillows under your head. °· Rest as needed. Get at least 8 hours of sleep each night. °· Drink enough water to keep your pee (urine) pale yellow. °· Eat a healthy diet that includes plenty of vegetables, fruits, whole grains, low-fat dairy products, and lean protein. °· Do not use any products that contain nicotine or tobacco. These include cigarettes, e-cigarettes, and chewing tobacco. If you need help quitting, ask your doctor. °· Keep all follow-up visits as told by your doctor. This is important. °How is this prevented? °A shot (vaccine) can help prevent pneumonia. Shots are often suggested for: °· People older than 83 years of age. °· People older than 83 years of age who: °? Are having cancer treatment. °? Have long-term (chronic) lung disease. °? Have problems with their body's defense system. °You may also prevent pneumonia if you take these actions: °· Get the flu (influenza) shot every year. °· Go to the dentist as   often as told. °· Wash your hands often. If you cannot use soap and water, use hand sanitizer. °Contact a doctor if: °· You have a fever. °· You lose sleep because your cough medicine does not help. °Get help right away if: °· You are short of breath and it gets worse. °· You have more chest pain. °· Your sickness gets worse. This is very serious if: °? You are an older adult. °? Your body's defense system is weak. °· You  cough up blood. °Summary °· Pneumonia is an infection of the lungs. °· Most adults can be treated at home. Some will need treatment in a hospital. °· Drink enough water to keep your pee pale yellow. °· Get at least 8 hours of sleep each night. °This information is not intended to replace advice given to you by your health care provider. Make sure you discuss any questions you have with your health care provider. °Document Released: 08/29/2007 Document Revised: 07/02/2018 Document Reviewed: 11/07/2017 °Elsevier Patient Education © 2020 [employer] ° °

## 2019-02-09 LAB — CULTURE, BLOOD (ROUTINE X 2)
Culture: NO GROWTH
Culture: NO GROWTH

## 2019-02-11 ENCOUNTER — Encounter: Payer: Self-pay | Admitting: Emergency Medicine

## 2019-02-11 ENCOUNTER — Emergency Department: Payer: Medicare HMO

## 2019-02-11 ENCOUNTER — Inpatient Hospital Stay
Admission: EM | Admit: 2019-02-11 | Discharge: 2019-02-13 | DRG: 177 | Disposition: A | Discharge status: Other Acute Inpatient Hospital | Payer: Medicare HMO | Attending: Internal Medicine | Admitting: Internal Medicine

## 2019-02-11 ENCOUNTER — Other Ambulatory Visit: Payer: Self-pay

## 2019-02-11 DIAGNOSIS — J441 Chronic obstructive pulmonary disease with (acute) exacerbation: Secondary | ICD-10-CM | POA: Diagnosis present

## 2019-02-11 DIAGNOSIS — E785 Hyperlipidemia, unspecified: Secondary | ICD-10-CM | POA: Diagnosis present

## 2019-02-11 DIAGNOSIS — Z79899 Other long term (current) drug therapy: Secondary | ICD-10-CM

## 2019-02-11 DIAGNOSIS — E1122 Type 2 diabetes mellitus with diabetic chronic kidney disease: Secondary | ICD-10-CM | POA: Diagnosis present

## 2019-02-11 DIAGNOSIS — Z87442 Personal history of urinary calculi: Secondary | ICD-10-CM

## 2019-02-11 DIAGNOSIS — J129 Viral pneumonia, unspecified: Secondary | ICD-10-CM | POA: Diagnosis present

## 2019-02-11 DIAGNOSIS — Z20828 Contact with and (suspected) exposure to other viral communicable diseases: Secondary | ICD-10-CM | POA: Diagnosis present

## 2019-02-11 DIAGNOSIS — I129 Hypertensive chronic kidney disease with stage 1 through stage 4 chronic kidney disease, or unspecified chronic kidney disease: Secondary | ICD-10-CM | POA: Diagnosis present

## 2019-02-11 DIAGNOSIS — Z888 Allergy status to other drugs, medicaments and biological substances status: Secondary | ICD-10-CM

## 2019-02-11 DIAGNOSIS — F419 Anxiety disorder, unspecified: Secondary | ICD-10-CM | POA: Diagnosis present

## 2019-02-11 DIAGNOSIS — U071 COVID-19: Secondary | ICD-10-CM | POA: Diagnosis not present

## 2019-02-11 DIAGNOSIS — Z66 Do not resuscitate: Secondary | ICD-10-CM | POA: Diagnosis not present

## 2019-02-11 DIAGNOSIS — Z7952 Long term (current) use of systemic steroids: Secondary | ICD-10-CM

## 2019-02-11 DIAGNOSIS — J189 Pneumonia, unspecified organism: Secondary | ICD-10-CM | POA: Diagnosis not present

## 2019-02-11 DIAGNOSIS — M48062 Spinal stenosis, lumbar region with neurogenic claudication: Secondary | ICD-10-CM | POA: Diagnosis present

## 2019-02-11 DIAGNOSIS — I429 Cardiomyopathy, unspecified: Secondary | ICD-10-CM | POA: Diagnosis present

## 2019-02-11 DIAGNOSIS — J9601 Acute respiratory failure with hypoxia: Secondary | ICD-10-CM | POA: Diagnosis present

## 2019-02-11 DIAGNOSIS — N1831 Chronic kidney disease, stage 3a: Secondary | ICD-10-CM | POA: Diagnosis present

## 2019-02-11 DIAGNOSIS — Y95 Nosocomial condition: Secondary | ICD-10-CM | POA: Diagnosis present

## 2019-02-11 DIAGNOSIS — I482 Chronic atrial fibrillation, unspecified: Secondary | ICD-10-CM | POA: Diagnosis not present

## 2019-02-11 DIAGNOSIS — J90 Pleural effusion, not elsewhere classified: Secondary | ICD-10-CM | POA: Diagnosis present

## 2019-02-11 DIAGNOSIS — F17220 Nicotine dependence, chewing tobacco, uncomplicated: Secondary | ICD-10-CM | POA: Diagnosis present

## 2019-02-11 DIAGNOSIS — J440 Chronic obstructive pulmonary disease with acute lower respiratory infection: Secondary | ICD-10-CM | POA: Diagnosis present

## 2019-02-11 DIAGNOSIS — Z09 Encounter for follow-up examination after completed treatment for conditions other than malignant neoplasm: Secondary | ICD-10-CM | POA: Diagnosis not present

## 2019-02-11 DIAGNOSIS — J449 Chronic obstructive pulmonary disease, unspecified: Secondary | ICD-10-CM | POA: Diagnosis not present

## 2019-02-11 DIAGNOSIS — G8929 Other chronic pain: Secondary | ICD-10-CM | POA: Diagnosis not present

## 2019-02-11 DIAGNOSIS — Z7951 Long term (current) use of inhaled steroids: Secondary | ICD-10-CM | POA: Diagnosis not present

## 2019-02-11 DIAGNOSIS — D631 Anemia in chronic kidney disease: Secondary | ICD-10-CM | POA: Diagnosis present

## 2019-02-11 DIAGNOSIS — Z882 Allergy status to sulfonamides status: Secondary | ICD-10-CM

## 2019-02-11 DIAGNOSIS — R0602 Shortness of breath: Secondary | ICD-10-CM | POA: Diagnosis not present

## 2019-02-11 DIAGNOSIS — Z950 Presence of cardiac pacemaker: Secondary | ICD-10-CM

## 2019-02-11 DIAGNOSIS — F329 Major depressive disorder, single episode, unspecified: Secondary | ICD-10-CM | POA: Diagnosis present

## 2019-02-11 DIAGNOSIS — I10 Essential (primary) hypertension: Secondary | ICD-10-CM | POA: Diagnosis not present

## 2019-02-11 DIAGNOSIS — Z720 Tobacco use: Secondary | ICD-10-CM | POA: Diagnosis not present

## 2019-02-11 DIAGNOSIS — N183 Chronic kidney disease, stage 3 unspecified: Secondary | ICD-10-CM | POA: Diagnosis not present

## 2019-02-11 DIAGNOSIS — Z7901 Long term (current) use of anticoagulants: Secondary | ICD-10-CM | POA: Diagnosis not present

## 2019-02-11 DIAGNOSIS — R509 Fever, unspecified: Secondary | ICD-10-CM | POA: Diagnosis not present

## 2019-02-11 DIAGNOSIS — Z7984 Long term (current) use of oral hypoglycemic drugs: Secondary | ICD-10-CM | POA: Diagnosis not present

## 2019-02-11 LAB — COMPREHENSIVE METABOLIC PANEL
ALT: 29 U/L (ref 0–44)
AST: 19 U/L (ref 15–41)
Albumin: 3.3 g/dL — ABNORMAL LOW (ref 3.5–5.0)
Alkaline Phosphatase: 52 U/L (ref 38–126)
Anion gap: 12 (ref 5–15)
BUN: 30 mg/dL — ABNORMAL HIGH (ref 8–23)
CO2: 19 mmol/L — ABNORMAL LOW (ref 22–32)
Calcium: 8.5 mg/dL — ABNORMAL LOW (ref 8.9–10.3)
Chloride: 104 mmol/L (ref 98–111)
Creatinine, Ser: 1.8 mg/dL — ABNORMAL HIGH (ref 0.61–1.24)
GFR calc Af Amer: 39 mL/min — ABNORMAL LOW (ref >60)
GFR calc non Af Amer: 34 mL/min — ABNORMAL LOW (ref >60)
Glucose, Bld: 216 mg/dL — ABNORMAL HIGH (ref 70–99)
Potassium: 4.5 mmol/L (ref 3.5–5.1)
Sodium: 135 mmol/L (ref 135–145)
Total Bilirubin: 0.7 mg/dL (ref 0.3–1.2)
Total Protein: 6.3 g/dL — ABNORMAL LOW (ref 6.5–8.1)

## 2019-02-11 LAB — CBC WITH DIFFERENTIAL/PLATELET
Abs Immature Granulocytes: 0.3 10*3/uL — ABNORMAL HIGH (ref 0.00–0.07)
Basophils Absolute: 0 10*3/uL (ref 0.0–0.1)
Basophils Relative: 1 %
Eosinophils Absolute: 0.1 10*3/uL (ref 0.0–0.5)
Eosinophils Relative: 1 %
HCT: 38.1 % — ABNORMAL LOW (ref 39.0–52.0)
Hemoglobin: 11.6 g/dL — ABNORMAL LOW (ref 13.0–17.0)
Immature Granulocytes: 4 %
Lymphocytes Relative: 6 %
Lymphs Abs: 0.5 10*3/uL — ABNORMAL LOW (ref 0.7–4.0)
MCH: 29.5 pg (ref 26.0–34.0)
MCHC: 30.4 g/dL (ref 30.0–36.0)
MCV: 96.9 fL (ref 80.0–100.0)
Monocytes Absolute: 0.3 10*3/uL (ref 0.1–1.0)
Monocytes Relative: 4 %
Neutro Abs: 7.1 10*3/uL (ref 1.7–7.7)
Neutrophils Relative %: 84 %
Platelets: 181 10*3/uL (ref 150–400)
RBC: 3.93 MIL/uL — ABNORMAL LOW (ref 4.22–5.81)
RDW: 13.9 % (ref 11.5–15.5)
WBC: 8.4 10*3/uL (ref 4.0–10.5)
nRBC: 0 % (ref 0.0–0.2)

## 2019-02-11 LAB — TROPONIN I (HIGH SENSITIVITY)
Troponin I (High Sensitivity): 22 ng/L — ABNORMAL HIGH (ref <18)
Troponin I (High Sensitivity): 27 ng/L — ABNORMAL HIGH (ref <18)

## 2019-02-11 MED ORDER — VANCOMYCIN HCL IN DEXTROSE 1-5 GM/200ML-% IV SOLN
1000.0000 mg | Freq: Once | INTRAVENOUS | Status: AC
  Administered 2019-02-11: 23:00:00 1000 mg via INTRAVENOUS
  Filled 2019-02-11: qty 200

## 2019-02-11 MED ORDER — IPRATROPIUM-ALBUTEROL 0.5-2.5 (3) MG/3ML IN SOLN
3.0000 mL | Freq: Once | RESPIRATORY_TRACT | Status: AC
  Administered 2019-02-11: 23:00:00 3 mL via RESPIRATORY_TRACT
  Filled 2019-02-11: qty 3

## 2019-02-11 MED ORDER — METHYLPREDNISOLONE SODIUM SUCC 125 MG IJ SOLR
125.0000 mg | Freq: Once | INTRAMUSCULAR | Status: AC
  Administered 2019-02-11: 125 mg via INTRAVENOUS
  Filled 2019-02-11: qty 2

## 2019-02-11 MED ORDER — SODIUM CHLORIDE 0.9 % IV SOLN
2.0000 g | Freq: Once | INTRAVENOUS | Status: AC
  Administered 2019-02-11: 2 g via INTRAVENOUS
  Filled 2019-02-11: qty 2

## 2019-02-11 MED ORDER — HYDROCODONE-ACETAMINOPHEN 5-325 MG PO TABS
1.0000 | ORAL_TABLET | Freq: Once | ORAL | Status: AC
  Administered 2019-02-12: 1 via ORAL
  Filled 2019-02-11: qty 1

## 2019-02-11 NOTE — ED Triage Notes (Signed)
PT from home with c/o pneumonia worsening. States his doctor called him and told him it was worse. PT c/o COB. Pt 97% on RA. Pt A&OX4

## 2019-02-11 NOTE — ED Notes (Addendum)
Reviewed pt's cc and results; troponin & EKG added due to pt's c/o SHOB--lab called for add-on; pt sitting in lobby, eyes closed; denies any c/o; resp even/unlab; pt updated on wait time and plan of care; voices good understanding; taken to triage for repeat vs, EKG & labs

## 2019-02-11 NOTE — ED Provider Notes (Addendum)
[facility] Emergency Department Provider Note  ____________________________________________  Time seen: Approximately 10:20 PM   Medical screening examination/treatment/procedure(s) were conducted as a shared visit with non-physician practitioner(s) and myself.  I personally evaluated the patient during the encounter.      HISTORY  Chief Complaint Shortness of breath, body aches  HPI Johnathan Hudson is a 85 y.o. male presents to the ER per request of Dr. Ginette Pitman for worsening pneumonia. He was hospitalized last week for pneumonia and released on 02/07/2019. Hospital follow up today with repeat chest x-ray and labs. He was notified this evening to come to the ER. Patient states that he feels "awful." Subjective fever at home. He is still taking antibiotic as prescribed.  Past Medical History:  Diagnosis Date  . Anxiety   . Arthritis    rheumatoid  . Benign essential tremor   . Cardiomyopathy ([place])    mild  . Chronic kidney disease    kidney stones  . COPD (chronic obstructive pulmonary disease) ([place])   . Diabetes mellitus without complication ([place])   . Dysrhythmia    atrial fib  . Episodic atrial fibrillation ([place])   . Hyperlipemia   . Hypertension   . Lumbar stenosis with neurogenic claudication   . Osteoarthritis   . Psoriasis   . Renal insufficiency     Patient Active Problem List   Diagnosis Date Noted  . Atrial fibrillation, chronic ([place])   . Tremor   . Anxiety and depression   . COPD with acute exacerbation ([place])   . Sepsis ([place]) 02/04/2019  . Atrial fibrillation with RVR ([place])   . Community acquired pneumonia   . Hypotension   . Type 2 diabetes mellitus with stage 3a chronic kidney disease, without long-term current use of insulin ([place])     Past Surgical History:  Procedure Laterality Date  . CYSTOSCOPY  1993 and 2004  . CYSTOSCOPY WITH URETHRAL DILATATION Left 02/05/2017   Procedure: CYSTOSCOPY WITH URETHRAL DILATATION;  Surgeon:  Royston Cowper, MD;  Location: [facility] ORS;  Service: Urology;  Laterality: Left;  . INSERT / REPLACE / REMOVE PACEMAKER    . KIDNEY STONE SURGERY Left   . LASER OF PROSTATE W/ GREEN LIGHT PVP  2004   photovaporization of prostate with greelight laser   . ORIF ANKLE FRACTURE Right   . PACEMAKER INSERTION    . PACEMAKER INSERTION N/A 03/01/2015   Procedure: PACEMAKER CHANGE OUT;  Surgeon: Isaias Cowman, MD;  Location: [facility] ORS;  Service: Cardiovascular;  Laterality: N/A;  . PENILE PROSTHESIS IMPLANT  1995   inflatable  . RETINAL DETACHMENT SURGERY Left   . TONSILLECTOMY      Prior to Admission medications   Medication Sig Start Date End Date Taking? Authorizing Provider  acetaminophen (TYLENOL) 500 MG tablet Take 1,000 mg 3 (three) times daily as needed by mouth for moderate pain.     [provider]  albuterol (VENTOLIN HFA) 108 (90 Base) MCG/ACT inhaler Inhale 2 puffs into the lungs every 6 (six) hours as needed for wheezing or shortness of breath. 02/07/19   Loletha Grayer, MD  ALPRAZolam Duanne Moron) 0.25 MG tablet Take 0.25 mg by mouth at bedtime as needed for sleep. 01/27/19   [provider]  apixaban (ELIQUIS) 2.5 MG TABS tablet Take 2.5 mg by mouth 2 (two) times daily.    [provider]  budesonide-formoterol (SYMBICORT) 80-4.5 MCG/ACT inhaler Inhale 2 puffs into the lungs 2 (two) times daily. 02/07/19   Wieting, Richard,  MD  doxycycline (VIBRA-TABS) 100 MG tablet Take 1 tablet (100 mg total) by mouth every 12 (twelve) hours for 7 doses. 02/07/19 02/11/19  Loletha Grayer, MD  gabapentin (NEURONTIN) 300 MG capsule Take 300 mg by mouth 2 (two) times daily.     [provider]  HYDROcodone-acetaminophen (NORCO) 7.5-325 MG tablet Take 1 tablet by mouth 3 (three) times daily as needed for pain. 01/08/19   [provider]  LORazepam (ATIVAN) 0.5 MG tablet Take 1 tablet (0.5 mg total) by mouth 2 (two) times daily. 02/07/19   Loletha Grayer,  MD  methenamine (HIPREX) 1 g tablet Take 1 g by mouth 2 (two) times daily with a meal.    [provider]  mupirocin ointment (BACTROBAN) 2 % Place 1 application into the nose 2 (two) times daily for 5 days. 02/07/19 02/12/19  Loletha Grayer, MD  omeprazole (PRILOSEC) 20 MG capsule Take 20 mg by mouth 2 (two) times daily before a meal.    [provider]  propranolol (INDERAL) 20 MG tablet Take 1 tablet (20 mg total) by mouth 3 (three) times daily. 02/07/19   Loletha Grayer, MD  sertraline (ZOLOFT) 50 MG tablet Take 50 mg by mouth daily.    [provider]  simvastatin (ZOCOR) 20 MG tablet Take 20 mg by mouth every evening.     [provider]  tiotropium (SPIRIVA HANDIHALER) 18 MCG inhalation capsule Place 1 capsule (18 mcg total) into inhaler and inhale daily. 02/07/19 02/07/20  Loletha Grayer, MD    Allergies Sulfa antibiotics and Lipitor [atorvastatin]  Family History  Problem Relation Age of Onset  . Cancer Mother   . Alzheimer's disease Father     Social History Social History   Tobacco Use  . Smoking status: Former Research scientist (life sciences)  . Smokeless tobacco: Current User    Types: Chew  Substance Use Topics  . Alcohol use: No  . Drug use: No    Review of Systems Constitutional: Positive for fever. ENT: Negataive for sore throat. Respiratory: Positive for productive cough. Positive for shortness of breath. Gastrointestinal: No abdominal pain.  No nausea, no vomiting.  No diarrhea.  Musculoskeletal: Negative for generalized body aches. Skin: Negative for rash/lesion/wound. Neurological: Negative for headaches, focal weakness or numbness.  ____________________________________________   PHYSICAL EXAM:  VITAL SIGNS:  Today's Vitals   02/11/19 1739 02/11/19 1741 02/11/19 1959  BP:  128/74 132/82  Pulse:  76 86  Resp:  16 16  Temp:  99.2 F (37.3 C)   TempSrc:  Oral   SpO2:  95% 97%  PainSc: 6      There is no height or weight on  file to calculate BMI.   Constitutional: Alert and oriented. No acute distress. Head: Atraumatic. Nose: No congestion/rhinnorhea. Mouth/Throat: Mucous membranes are moist. Neck: No stridor.  Cardiovascular: Good peripheral circulation. Respiratory: Normal respiratory effort.  Diffuse expiratory wheezing.  Fairly normal expiratory phase.  Diminished breath sounds at the left middle and lower lung field Musculoskeletal: Full range of motion in all extremities Neurologic:  Normal speech and language. No gross focal neurologic deficits are appreciated. Speech is normal. No gait instability. Skin:  Skin is warm, dry and intact. No rash noted. Psychiatric: Mood and affect are normal. Speech and behavior are normal.  ____________________________________________   LABS (all labs ordered are listed, but only abnormal results are displayed)  Labs Reviewed  COMPREHENSIVE METABOLIC PANEL - Abnormal; Notable for the following components:      Result Value  CO2 19 (*)    Glucose, Bld 216 (*)    BUN 30 (*)    Creatinine, Ser 1.80 (*)    Calcium 8.5 (*)    Total Protein 6.3 (*)    Albumin 3.3 (*)    GFR calc non Af Amer 34 (*)    GFR calc Af Amer 39 (*)    All other components within normal limits  CBC WITH DIFFERENTIAL/PLATELET - Abnormal; Notable for the following components:   RBC 3.93 (*)    Hemoglobin 11.6 (*)    HCT 38.1 (*)    Lymphs Abs 0.5 (*)    Abs Immature Granulocytes 0.30 (*)    All other components within normal limits  TROPONIN I (HIGH SENSITIVITY) - Abnormal; Notable for the following components:   Troponin I (High Sensitivity) 22 (*)    All other components within normal limits  TROPONIN I (HIGH SENSITIVITY) - Abnormal; Notable for the following components:   Troponin I (High Sensitivity) 27 (*)    All other components within normal limits  SARS CORONAVIRUS 2 (TAT 6-24 HRS)   ____________________________________________  EKG  Not  indicated ____________________________________________   INITIAL CLINICAL IMPRESSION(S)   Worsening Pneumonia      ----------------------------------------- 11:11 PM on 02/11/2019 ----------------------------------------- Seen and examined by myself.  With light ambulation in the treatment room oxygenation dropped to 86%.  Diffuse expiratory wheezing but relatively normal E to I ratio.  Diminished breath sounds on the left.  Patient still very symptomatic.  Chest x-ray today shows persistent infiltrate at the left base.  Concerning for health care associated pneumonia.  Doubt PE.  Will start on cefepime and vancomycin, admit to hospitalist.  Start on nasal cannula oxygen for respiratory support.  Will repeat Covid screening.  Final diagnoses:  HCAP (healthcare-associated pneumonia)  Acute respiratory failure with hypoxia [facility])     Carrie Mew, MD 02/11/19 2312    Carrie Mew, MD 02/11/19 2314

## 2019-02-11 NOTE — ED Notes (Signed)
Johnathan Crofts, NP at bedside in triage for orders

## 2019-02-12 ENCOUNTER — Encounter: Payer: Self-pay | Admitting: Internal Medicine

## 2019-02-12 DIAGNOSIS — J441 Chronic obstructive pulmonary disease with (acute) exacerbation: Secondary | ICD-10-CM

## 2019-02-12 DIAGNOSIS — J9601 Acute respiratory failure with hypoxia: Secondary | ICD-10-CM

## 2019-02-12 DIAGNOSIS — J189 Pneumonia, unspecified organism: Secondary | ICD-10-CM

## 2019-02-12 LAB — SARS CORONAVIRUS 2 (TAT 6-24 HRS): SARS Coronavirus 2: POSITIVE — AB

## 2019-02-12 LAB — CBC WITH DIFFERENTIAL/PLATELET
Abs Immature Granulocytes: 0.13 10*3/uL — ABNORMAL HIGH (ref 0.00–0.07)
Basophils Absolute: 0 10*3/uL (ref 0.0–0.1)
Basophils Relative: 0 %
Eosinophils Absolute: 0 10*3/uL (ref 0.0–0.5)
Eosinophils Relative: 0 %
HCT: 37.5 % — ABNORMAL LOW (ref 39.0–52.0)
Hemoglobin: 11.8 g/dL — ABNORMAL LOW (ref 13.0–17.0)
Immature Granulocytes: 1 %
Lymphocytes Relative: 5 %
Lymphs Abs: 0.5 10*3/uL — ABNORMAL LOW (ref 0.7–4.0)
MCH: 29.6 pg (ref 26.0–34.0)
MCHC: 31.5 g/dL (ref 30.0–36.0)
MCV: 94.2 fL (ref 80.0–100.0)
Monocytes Absolute: 0.9 10*3/uL (ref 0.1–1.0)
Monocytes Relative: 9 %
Neutro Abs: 8.2 10*3/uL — ABNORMAL HIGH (ref 1.7–7.7)
Neutrophils Relative %: 85 %
Platelets: 184 10*3/uL (ref 150–400)
RBC: 3.98 MIL/uL — ABNORMAL LOW (ref 4.22–5.81)
RDW: 13.9 % (ref 11.5–15.5)
WBC: 9.8 10*3/uL (ref 4.0–10.5)
nRBC: 0 % (ref 0.0–0.2)

## 2019-02-12 LAB — COMPREHENSIVE METABOLIC PANEL
ALT: 23 U/L (ref 0–44)
AST: 15 U/L (ref 15–41)
Albumin: 3.3 g/dL — ABNORMAL LOW (ref 3.5–5.0)
Alkaline Phosphatase: 46 U/L (ref 38–126)
Anion gap: 10 (ref 5–15)
BUN: 35 mg/dL — ABNORMAL HIGH (ref 8–23)
CO2: 25 mmol/L (ref 22–32)
Calcium: 9.1 mg/dL (ref 8.9–10.3)
Chloride: 105 mmol/L (ref 98–111)
Creatinine, Ser: 1.66 mg/dL — ABNORMAL HIGH (ref 0.61–1.24)
GFR calc Af Amer: 44 mL/min — ABNORMAL LOW (ref >60)
GFR calc non Af Amer: 38 mL/min — ABNORMAL LOW (ref >60)
Glucose, Bld: 132 mg/dL — ABNORMAL HIGH (ref 70–99)
Potassium: 4.5 mmol/L (ref 3.5–5.1)
Sodium: 140 mmol/L (ref 135–145)
Total Bilirubin: 0.8 mg/dL (ref 0.3–1.2)
Total Protein: 6.5 g/dL (ref 6.5–8.1)

## 2019-02-12 LAB — STREP PNEUMONIAE URINARY ANTIGEN: Strep Pneumo Urinary Antigen: NEGATIVE

## 2019-02-12 LAB — GLUCOSE, CAPILLARY
Glucose-Capillary: 145 mg/dL — ABNORMAL HIGH (ref 70–99)
Glucose-Capillary: 107 mg/dL — ABNORMAL HIGH (ref 70–99)
Glucose-Capillary: 161 mg/dL — ABNORMAL HIGH (ref 70–99)

## 2019-02-12 LAB — CBC
HCT: 36 % — ABNORMAL LOW (ref 39.0–52.0)
Hemoglobin: 11.7 g/dL — ABNORMAL LOW (ref 13.0–17.0)
MCH: 29.5 pg (ref 26.0–34.0)
MCHC: 32.5 g/dL (ref 30.0–36.0)
MCV: 90.7 fL (ref 80.0–100.0)
Platelets: 177 10*3/uL (ref 150–400)
RBC: 3.97 MIL/uL — ABNORMAL LOW (ref 4.22–5.81)
RDW: 13.8 % (ref 11.5–15.5)
WBC: 5.9 10*3/uL (ref 4.0–10.5)
nRBC: 0 % (ref 0.0–0.2)

## 2019-02-12 LAB — BASIC METABOLIC PANEL
Anion gap: 9 (ref 5–15)
BUN: 30 mg/dL — ABNORMAL HIGH (ref 8–23)
CO2: 24 mmol/L (ref 22–32)
Calcium: 8.5 mg/dL — ABNORMAL LOW (ref 8.9–10.3)
Chloride: 103 mmol/L (ref 98–111)
Creatinine, Ser: 1.54 mg/dL — ABNORMAL HIGH (ref 0.61–1.24)
GFR calc Af Amer: 48 mL/min — ABNORMAL LOW (ref >60)
GFR calc non Af Amer: 41 mL/min — ABNORMAL LOW (ref >60)
Glucose, Bld: 191 mg/dL — ABNORMAL HIGH (ref 70–99)
Potassium: 4.6 mmol/L (ref 3.5–5.1)
Sodium: 136 mmol/L (ref 135–145)

## 2019-02-12 LAB — PROCALCITONIN: Procalcitonin: <0.1 ng/mL

## 2019-02-12 LAB — FIBRIN DERIVATIVES D-DIMER (ARMC ONLY): Fibrin derivatives D-dimer (ARMC): 607.64 ng/mL (FEU) — ABNORMAL HIGH (ref 0.00–499.00)

## 2019-02-12 LAB — C-REACTIVE PROTEIN: CRP: 2.2 mg/dL — ABNORMAL HIGH (ref <1.0)

## 2019-02-12 LAB — BRAIN NATRIURETIC PEPTIDE
B Natriuretic Peptide: 895 pg/mL — ABNORMAL HIGH (ref 0.0–100.0)
B Natriuretic Peptide: 744 pg/mL — ABNORMAL HIGH (ref 0.0–100.0)

## 2019-02-12 LAB — FERRITIN: Ferritin: 40 ng/mL (ref 24–336)

## 2019-02-12 MED ORDER — DEXAMETHASONE SODIUM PHOSPHATE 10 MG/ML IJ SOLN
6.0000 mg | INTRAMUSCULAR | Status: DC
  Administered 2019-02-12: 6 mg via INTRAVENOUS
  Filled 2019-02-12: qty 1

## 2019-02-12 MED ORDER — ACETAMINOPHEN 325 MG PO TABS: 650.0000 mg | ORAL_TABLET | Freq: Four times a day (QID) | ORAL | Status: DC | PRN

## 2019-02-12 MED ORDER — APIXABAN 2.5 MG PO TABS
2.5000 mg | ORAL_TABLET | Freq: Two times a day (BID) | ORAL | Status: DC
  Administered 2019-02-12 – 2019-02-13 (×2): 2.5 mg via ORAL
  Filled 2019-02-12 (×3): qty 1

## 2019-02-12 MED ORDER — DEXAMETHASONE SODIUM PHOSPHATE 10 MG/ML IJ SOLN: 6.0000 mg | INTRAMUSCULAR | 0 refills | Status: DC

## 2019-02-12 MED ORDER — LORAZEPAM 0.5 MG PO TABS
0.5000 mg | ORAL_TABLET | Freq: Two times a day (BID) | ORAL | Status: DC
  Administered 2019-02-12 (×2): 0.5 mg via ORAL
  Filled 2019-02-12 (×2): qty 1

## 2019-02-12 MED ORDER — HYDROCODONE-ACETAMINOPHEN 5-325 MG PO TABS
1.0000 | ORAL_TABLET | Freq: Three times a day (TID) | ORAL | Status: DC | PRN
  Administered 2019-02-12 (×2): 1 via ORAL
  Filled 2019-02-12 (×2): qty 1

## 2019-02-12 MED ORDER — ZINC SULFATE 220 (50 ZN) MG PO CAPS: 220.0000 mg | ORAL_CAPSULE | Freq: Every day | ORAL | Status: AC

## 2019-02-12 MED ORDER — PANTOPRAZOLE SODIUM 40 MG PO TBEC
40.0000 mg | DELAYED_RELEASE_TABLET | Freq: Every day | ORAL | Status: DC
  Administered 2019-02-12: 40 mg via ORAL
  Filled 2019-02-12: qty 1

## 2019-02-12 MED ORDER — SIMVASTATIN 10 MG PO TABS
20.0000 mg | ORAL_TABLET | Freq: Every evening | ORAL | Status: DC
  Administered 2019-02-12: 19:00:00 20 mg via ORAL
  Filled 2019-02-12: qty 2

## 2019-02-12 MED ORDER — GUAIFENESIN-DM 100-10 MG/5ML PO SYRP
10.0000 mL | ORAL_SOLUTION | ORAL | Status: DC | PRN
  Filled 2019-02-12: qty 10

## 2019-02-12 MED ORDER — ONDANSETRON HCL 4 MG PO TABS: 4.0000 mg | ORAL_TABLET | Freq: Four times a day (QID) | ORAL | Status: DC | PRN

## 2019-02-12 MED ORDER — HYDROCOD POLST-CPM POLST ER 10-8 MG/5ML PO SUER: 5.0000 mL | Freq: Two times a day (BID) | ORAL | Status: DC | PRN

## 2019-02-12 MED ORDER — LEVOFLOXACIN IN D5W 750 MG/150ML IV SOLN
750.0000 mg | INTRAVENOUS | Status: DC
  Administered 2019-02-12: 06:00:00 750 mg via INTRAVENOUS
  Filled 2019-02-12: qty 150

## 2019-02-12 MED ORDER — IPRATROPIUM-ALBUTEROL 20-100 MCG/ACT IN AERS: 1.0000 | INHALATION_SPRAY | Freq: Four times a day (QID) | RESPIRATORY_TRACT | Status: DC

## 2019-02-12 MED ORDER — ALBUTEROL SULFATE HFA 108 (90 BASE) MCG/ACT IN AERS
2.0000 | INHALATION_SPRAY | Freq: Four times a day (QID) | RESPIRATORY_TRACT | Status: DC | PRN
  Filled 2019-02-12: qty 6.7

## 2019-02-12 MED ORDER — VITAMIN C 500 MG PO TABS
500.0000 mg | ORAL_TABLET | Freq: Every day | ORAL | Status: DC
  Administered 2019-02-12: 500 mg via ORAL
  Filled 2019-02-12 (×2): qty 1

## 2019-02-12 MED ORDER — VANCOMYCIN HCL IN DEXTROSE 1-5 GM/200ML-% IV SOLN
1000.0000 mg | Freq: Once | INTRAVENOUS | Status: AC
  Administered 2019-02-12: 02:00:00 1000 mg via INTRAVENOUS
  Filled 2019-02-12: qty 200

## 2019-02-12 MED ORDER — TIOTROPIUM BROMIDE MONOHYDRATE 18 MCG IN CAPS
18.0000 ug | ORAL_CAPSULE | Freq: Every day | RESPIRATORY_TRACT | Status: DC
  Administered 2019-02-12: 11:00:00 18 ug via RESPIRATORY_TRACT
  Filled 2019-02-12: qty 5

## 2019-02-12 MED ORDER — ONDANSETRON HCL 4 MG/2ML IJ SOLN: 4.0000 mg | Freq: Four times a day (QID) | INTRAMUSCULAR | Status: DC | PRN

## 2019-02-12 MED ORDER — SERTRALINE HCL 50 MG PO TABS
50.0000 mg | ORAL_TABLET | Freq: Every day | ORAL | Status: DC
  Administered 2019-02-12: 50 mg via ORAL
  Filled 2019-02-12: qty 1

## 2019-02-12 MED ORDER — SODIUM CHLORIDE 0.9 % IV SOLN
200.0000 mg | Freq: Once | INTRAVENOUS | Status: AC
  Administered 2019-02-12: 200 mg via INTRAVENOUS
  Filled 2019-02-12: qty 40

## 2019-02-12 MED ORDER — ASCORBIC ACID 500 MG PO TABS: 500.0000 mg | ORAL_TABLET | Freq: Every day | ORAL | Status: AC

## 2019-02-12 MED ORDER — MOMETASONE FURO-FORMOTEROL FUM 100-5 MCG/ACT IN AERO
2.0000 | INHALATION_SPRAY | Freq: Two times a day (BID) | RESPIRATORY_TRACT | Status: DC
  Administered 2019-02-12 (×2): 2 via RESPIRATORY_TRACT
  Filled 2019-02-12: qty 8.8

## 2019-02-12 MED ORDER — INSULIN ASPART 100 UNIT/ML ~~LOC~~ SOLN
0.0000 [IU] | Freq: Three times a day (TID) | SUBCUTANEOUS | Status: DC
  Administered 2019-02-12: 1 [IU] via SUBCUTANEOUS
  Filled 2019-02-12: qty 1

## 2019-02-12 MED ORDER — ACETAMINOPHEN 650 MG RE SUPP: 650.0000 mg | Freq: Four times a day (QID) | RECTAL | Status: DC | PRN

## 2019-02-12 MED ORDER — SODIUM CHLORIDE 0.9 % IV SOLN: 100.0000 mg | INTRAVENOUS | Status: DC

## 2019-02-12 MED ORDER — IPRATROPIUM-ALBUTEROL 20-100 MCG/ACT IN AERS
1.0000 | INHALATION_SPRAY | Freq: Four times a day (QID) | RESPIRATORY_TRACT | Status: DC
  Administered 2019-02-12 (×2): 1 via RESPIRATORY_TRACT
  Filled 2019-02-12: qty 4

## 2019-02-12 MED ORDER — ALPRAZOLAM 0.5 MG PO TABS
0.2500 mg | ORAL_TABLET | Freq: Every evening | ORAL | Status: DC | PRN
  Administered 2019-02-12 – 2019-02-13 (×3): 0.25 mg via ORAL
  Filled 2019-02-12 (×3): qty 1

## 2019-02-12 MED ORDER — PROPRANOLOL HCL 20 MG PO TABS
20.0000 mg | ORAL_TABLET | Freq: Three times a day (TID) | ORAL | Status: DC
  Administered 2019-02-12 (×3): 20 mg via ORAL
  Filled 2019-02-12 (×3): qty 1

## 2019-02-12 MED ORDER — GABAPENTIN 300 MG PO CAPS
300.0000 mg | ORAL_CAPSULE | Freq: Two times a day (BID) | ORAL | Status: DC
  Administered 2019-02-12 (×2): 300 mg via ORAL
  Filled 2019-02-12 (×2): qty 1

## 2019-02-12 MED ORDER — ZINC SULFATE 220 (50 ZN) MG PO CAPS
220.0000 mg | ORAL_CAPSULE | Freq: Every day | ORAL | Status: DC
  Administered 2019-02-12: 220 mg via ORAL
  Filled 2019-02-12 (×2): qty 1

## 2019-02-12 NOTE — Progress Notes (Signed)
PHARMACY -  BRIEF ANTIBIOTIC NOTE   Pharmacy has received consult(s) for vanc/cefepime from an ED provider.  The patient's profile has been reviewed for ht/wt/allergies/indication/available labs.    One time order(s) placed for vanc 2g and cefepime 2g IV x 1  Further antibiotics/pharmacy consults should be ordered by admitting physician if indicated.                       Thank you,  Tobie Lords, PharmD, BCPS Clinical Pharmacist 02/12/2019  1:24 AM

## 2019-02-12 NOTE — Progress Notes (Signed)
Pharmacy Antibiotic Note  Johnathan Hudson is a 85 y.o. male admitted on 02/11/2019 with pneumonia.  Pharmacy has been consulted for levaquin dosing.  Plan: Will start levaquin 750 mg IV q48h per CrCl 20 - < 50 ml/min and will continue to monitor.  QTc 451 on EKG WNL will continue to monitor.  Temp (24hrs), Avg:99.2 F (37.3 C), Min:99.2 F (37.3 C), Max:99.2 F (37.3 C)  Recent Labs  Lab 02/05/19 0534 02/06/19 0739 02/11/19 1744  WBC 13.9*  --  8.4  CREATININE 1.68* 1.92* 1.80*    Estimated Creatinine Clearance: 28.1 mL/min (A) (by C-G formula based on SCr of 1.8 mg/dL (H)).    Allergies  Allergen Reactions  . Sulfa Antibiotics Rash  . Lipitor [Atorvastatin] Other (See Comments)    Myalgia   Thank you for allowing pharmacy to be a part of this patient's care.  Tobie Lords, PharmD, BCPS Clinical Pharmacist 02/12/2019 4:39 AM

## 2019-02-12 NOTE — ED Notes (Signed)
Family at bedside. 

## 2019-02-12 NOTE — Consult Note (Signed)
Remdesivir - Pharmacy Brief Note   O:  ALT: 29 CXR: Small bilateral pleural effusions with persistent patchy airspace disease at the left base which may reflect residual pneumonia SpO2: 96% on RA   A/P:  Remdesivir 200 mg IVPB once followed by 100 mg IVPB daily x 4 days.   Vallery Sa, PharmD 02/12/2019 3:39 PM

## 2019-02-12 NOTE — H&P (Signed)
History and Physical    BARKON CLINK [id number] DOB: Mar 22, 1934 DOA: 02/11/2019  PCP: Tracie Harrier, MD  Patient coming from: Home.  Chief Complaint: Shortness of breath.  HPI: Johnathan Hudson is a 85 y.o. male with history of COPD, diabetes mellitus type 2, bradycardia status post pacemaker placement, chronic kidney disease depression and anxiety was just discharged home 5 days ago after being treated for sepsis secondary to pneumonia at the time patient also was found to be in A. fib with RVR for which patient was started on propranolol which also will help with his tremors.  Patient states he was doing fine after discharge but since last night he started becoming short of breath febrile with temperature 100F with wheezing.  Given the persistent shortness of breath patient went to the primary care physician office over the chest x-ray showed worsening infiltrates was referred to the ER.  ED Course: In the ER patient was hypoxic with chest x-ray showing bilateral pleural effusion with patchy infiltrates persist on the left lower lobe which is showing improving pneumonia.  Lab work show high-sensitivity troponin of 22 and 27 hemoglobin 11.6 WBC 8.4 platelets 181 EKG showing paced rhythm and on exam patient was wheezing.  Patient's creatinine was 1.8 at around baseline.  Patient was started on steroids antibiotics and admitted for COPD exacerbation and pneumonia.  COVID-19 test is pending.  Review of Systems: As per HPI, rest all negative.   Past Medical History:  Diagnosis Date  . Anxiety   . Arthritis    rheumatoid  . Benign essential tremor   . Cardiomyopathy ([place])    mild  . Chronic kidney disease    kidney stones  . COPD (chronic obstructive pulmonary disease) ([place])   . Diabetes mellitus without complication ([place])   . Dysrhythmia    atrial fib  . Episodic atrial fibrillation ([place])   . Hyperlipemia   . Hypertension   . Lumbar stenosis with neurogenic claudication    . Osteoarthritis   . Psoriasis   . Renal insufficiency     Past Surgical History:  Procedure Laterality Date  . CYSTOSCOPY  1993 and 2004  . CYSTOSCOPY WITH URETHRAL DILATATION Left 02/05/2017   Procedure: CYSTOSCOPY WITH URETHRAL DILATATION;  Surgeon: Royston Cowper, MD;  Location: [facility] ORS;  Service: Urology;  Laterality: Left;  . INSERT / REPLACE / REMOVE PACEMAKER    . KIDNEY STONE SURGERY Left   . LASER OF PROSTATE W/ GREEN LIGHT PVP  2004   photovaporization of prostate with greelight laser   . ORIF ANKLE FRACTURE Right   . PACEMAKER INSERTION    . PACEMAKER INSERTION N/A 03/01/2015   Procedure: PACEMAKER CHANGE OUT;  Surgeon: Isaias Cowman, MD;  Location: [facility] ORS;  Service: Cardiovascular;  Laterality: N/A;  . PENILE PROSTHESIS IMPLANT  1995   inflatable  . RETINAL DETACHMENT SURGERY Left   . TONSILLECTOMY       reports that he has quit smoking. His smokeless tobacco use includes chew. He reports that he does not drink alcohol or use drugs.  Allergies  Allergen Reactions  . Sulfa Antibiotics Rash  . Lipitor [Atorvastatin] Other (See Comments)    Myalgia    Family History  Problem Relation Age of Onset  . Cancer Mother   . Alzheimer's disease Father     Prior to Admission medications   Medication Sig Start Date End Date Taking? Authorizing Provider  acetaminophen (TYLENOL) 500 MG tablet Take 1,000 mg 3 (three) times  daily as needed by mouth for moderate pain.    Yes [provider]  albuterol (VENTOLIN HFA) 108 (90 Base) MCG/ACT inhaler Inhale 2 puffs into the lungs every 6 (six) hours as needed for wheezing or shortness of breath. 02/07/19  Yes Wieting, Richard, MD  ALPRAZolam Duanne Moron) 0.25 MG tablet Take 0.25 mg by mouth at bedtime as needed for sleep. 01/27/19  Yes [provider]  apixaban (ELIQUIS) 2.5 MG TABS tablet Take 2.5 mg by mouth 2 (two) times daily.   Yes [provider]  budesonide-formoterol (SYMBICORT) 80-4.5  MCG/ACT inhaler Inhale 2 puffs into the lungs 2 (two) times daily. 02/07/19  Yes Wieting, Richard, MD  gabapentin (NEURONTIN) 300 MG capsule Take 300 mg by mouth 2 (two) times daily.    Yes [provider]  HYDROcodone-acetaminophen (NORCO) 7.5-325 MG tablet Take 1 tablet by mouth 3 (three) times daily as needed for pain. 01/08/19  Yes [provider]  ipratropium-albuterol (DUONEB) 0.5-2.5 (3) MG/3ML SOLN Inhale 3 mLs into the lungs 4 (four) times daily. 02/11/19  Yes [provider]  levofloxacin (LEVAQUIN) 500 MG tablet Take 500 mg by mouth daily. 02/11/19 02/20/19 Yes [provider]  LORazepam (ATIVAN) 0.5 MG tablet Take 1 tablet (0.5 mg total) by mouth 2 (two) times daily. 02/07/19  Yes Wieting, Richard, MD  methenamine (HIPREX) 1 g tablet Take 1 g by mouth 2 (two) times daily with a meal.   Yes [provider]  mupirocin ointment (BACTROBAN) 2 % Place 1 application into the nose 2 (two) times daily for 5 days. 02/07/19 02/12/19 Yes Wieting, Richard, MD  omeprazole (PRILOSEC) 20 MG capsule Take 20 mg by mouth 2 (two) times daily before a meal.   Yes [provider]  predniSONE (DELTASONE) 10 MG tablet Take 10-30 mg by mouth as directed. Take 30mg  x3 days, then 20mg  x3 days, then 10mg  x3 days 02/11/19  Yes [provider]  propranolol (INDERAL) 20 MG tablet Take 1 tablet (20 mg total) by mouth 3 (three) times daily. 02/07/19  Yes Wieting, Richard, MD  sertraline (ZOLOFT) 50 MG tablet Take 50 mg by mouth daily.   Yes [provider]  simvastatin (ZOCOR) 20 MG tablet Take 20 mg by mouth every evening.    Yes [provider]  tiotropium (SPIRIVA HANDIHALER) 18 MCG inhalation capsule Place 1 capsule (18 mcg total) into inhaler and inhale daily. 02/07/19 02/07/20 Yes Loletha Grayer, MD    Physical Exam: Constitutional: Moderately built and nourished. Vitals:   02/12/19 0030 02/12/19 0100 02/12/19 0130 02/12/19 0200   BP: 109/84 (!) 142/78 122/71 (!) 147/87  Pulse: 66 60 73 68  Resp: (!) 21 18 17 18   Temp:      TempSrc:      SpO2: 95% 97% 94% 97%   Eyes: Anicteric no pallor. ENMT: No discharge from the ears eyes nose or mouth. Neck: No mass or.  No neck rigidity. Respiratory: Mild expiratory wheeze and no crepitations. Cardiovascular: S1-S2 heard. Abdomen: Soft nontender bowel sounds present. Musculoskeletal: No edema.  No joint effusion. Skin: No rash. Neurologic: Alert awake oriented to time place and person.  Moves all extremities. Psychiatric: Appears normal per normal affect.   Labs on Admission: I have personally reviewed following labs and imaging studies  CBC: Recent Labs  Lab 02/05/19 0534 02/11/19 1744  WBC 13.9* 8.4  NEUTROABS  --  7.1  HGB 10.6* 11.6*  HCT 33.9* 38.1*  MCV 93.9 96.9  PLT 182 181  Basic Metabolic Panel: Recent Labs  Lab 02/05/19 0534 02/06/19 0739 02/11/19 1744  NA 138 140 135  K 4.7 4.9 4.5  CL 110 109 104  CO2 20* 21* 19*  GLUCOSE 145* 168* 216*  BUN 28* 29* 30*  CREATININE 1.68* 1.92* 1.80*  CALCIUM 8.6* 8.9 8.5*   GFR: Estimated Creatinine Clearance: 28.1 mL/min (A) (by C-G formula based on SCr of 1.8 mg/dL (H)). Liver Function Tests: Recent Labs  Lab 02/11/19 1744  AST 19  ALT 29  ALKPHOS 52  BILITOT 0.7  PROT 6.3*  ALBUMIN 3.3*   No results for input(s): LIPASE, AMYLASE in the last 168 hours. No results for input(s): AMMONIA in the last 168 hours. Coagulation Profile: No results for input(s): INR, PROTIME in the last 168 hours. Cardiac Enzymes: No results for input(s): CKTOTAL, CKMB, CKMBINDEX, TROPONINI in the last 168 hours. BNP (last 3 results) No results for input(s): PROBNP in the last 8760 hours. HbA1C: No results for input(s): HGBA1C in the last 72 hours. CBG: Recent Labs  Lab 02/06/19 1211 02/06/19 1645 02/06/19 2035 02/07/19 0732 02/07/19 1240  GLUCAP 168* 101* 93 95 191*   Lipid Profile: No results for  input(s): CHOL, HDL, LDLCALC, TRIG, CHOLHDL, LDLDIRECT in the last 72 hours. Thyroid Function Tests: No results for input(s): TSH, T4TOTAL, FREET4, T3FREE, THYROIDAB in the last 72 hours. Anemia Panel: No results for input(s): VITAMINB12, FOLATE, FERRITIN, TIBC, IRON, RETICCTPCT in the last 72 hours. Urine analysis:    Component Value Date/Time   COLORURINE YELLOW (A) 02/04/2019 0946   APPEARANCEUR CLEAR (A) 02/04/2019 0946   LABSPEC 1.014 02/04/2019 0946   PHURINE 5.0 02/04/2019 0946   GLUCOSEU NEGATIVE 02/04/2019 0946   HGBUR NEGATIVE 02/04/2019 0946   BILIRUBINUR NEGATIVE 02/04/2019 0946   KETONESUR NEGATIVE 02/04/2019 0946   PROTEINUR NEGATIVE 02/04/2019 0946   NITRITE NEGATIVE 02/04/2019 0946   LEUKOCYTESUR NEGATIVE 02/04/2019 0946   Sepsis Labs: @LABRCNTIP (procalcitonin:4,lacticidven:4) ) Recent Results (from the past 240 hour(s))  Blood Culture (routine x 2)     Status: None   Collection Time: 02/04/19  9:41 AM   Specimen: BLOOD  Result Value Ref Range Status   Specimen Description BLOOD LEFT FA  Final   Special Requests   Final    BOTTLES DRAWN AEROBIC AND ANAEROBIC Blood Culture results may not be optimal due to an excessive volume of blood received in culture bottles   Culture   Final    NO GROWTH 5 DAYS Performed at [facility], [address] [place], [place] [address]    Report Status 02/09/2019 FINAL  Final  Blood Culture (routine x 2)     Status: None   Collection Time: 02/04/19  9:41 AM   Specimen: BLOOD  Result Value Ref Range Status   Specimen Description BLOOD RIGHT HAND  Final   Special Requests   Final    BOTTLES DRAWN AEROBIC AND ANAEROBIC Blood Culture results may not be optimal due to an excessive volume of blood received in culture bottles   Culture   Final    NO GROWTH 5 DAYS Performed at [facility], [address], [place], [place] [address]    Report Status 02/09/2019 FINAL  Final  Urine culture     Status: Abnormal    Collection Time: 02/04/19  9:46 AM   Specimen: In/Out Cath Urine  Result Value Ref Range Status   Specimen Description   Final    IN/OUT CATH URINE Performed at [facility],  [place], [place] [address]    Special Requests   Final    NONE Performed at [facility], [address] [place], [place] [address]    Culture MULTIPLE SPECIES PRESENT, SUGGEST RECOLLECTION (A)  Final   Report Status 02/05/2019 FINAL  Final  SARS CORONAVIRUS 2 (TAT 6-24 HRS) Nasopharyngeal Nasopharyngeal Swab     Status: None   Collection Time: 02/04/19 11:11 AM   Specimen: Nasopharyngeal Swab  Result Value Ref Range Status   SARS Coronavirus 2 NEGATIVE NEGATIVE Final    Comment: (NOTE) SARS-CoV-2 target nucleic acids are NOT DETECTED. The SARS-CoV-2 RNA is generally detectable in upper and lower respiratory specimens during the acute phase of infection. Negative results do not preclude SARS-CoV-2 infection, do not rule out co-infections with other pathogens, and should not be used as the sole basis for treatment or other patient management decisions. Negative results must be combined with clinical observations, patient history, and epidemiological information. The expected result is Negative. Fact Sheet for Patients: SugarRoll.be Fact Sheet for Healthcare Providers: https://www.woods-mathews.com/ This test is not yet approved or cleared by the [place] FDA and  has been authorized for detection and/or diagnosis of SARS-CoV-2 by FDA under an Emergency Use Authorization (EUA). This EUA will remain  in effect (meaning this test can be used) for the duration of the COVID-19 declaration under Section 56 4(b)(1) of the Act, 21 U.S.C. section 360bbb-3(b)(1), unless the authorization is terminated or revoked sooner. Performed at [facility], [place] [address] [place], [place] [address]   MRSA PCR Screening     Status:  Abnormal   Collection Time: 02/04/19 11:38 AM  Result Value Ref Range Status   MRSA by PCR POSITIVE (A) NEGATIVE Final    Comment:        The GeneXpert MRSA Assay (FDA approved for NASAL specimens only), is one component of a comprehensive MRSA colonization surveillance program. It is not intended to diagnose MRSA infection nor to guide or monitor treatment for MRSA infections. RESULT CALLED TO, READ BACK BY AND VERIFIED WITH: NOAH GRIFFITH @1318  ON 02/04/2019 BY FMW Performed at [facility], [address] [place], [place] [address]      Radiological Exams on Admission: Dg Chest 2 View  Result Date: 02/11/2019 CLINICAL DATA:  Recent pneumonia, non improving EXAM: CHEST - 2 VIEW COMPARISON:  02/06/2019, 02/04/2019, CT 03/05/2018 FINDINGS: Small bilateral pleural effusions. Patchy airspace disease at the left base. Stable cardiomediastinal silhouette. Left-sided pacing device as before. No pneumothorax. IMPRESSION: 1. Small bilateral pleural effusions with persistent patchy airspace disease at the left base which may reflect residual pneumonia. 2. Borderline to mild cardiomegaly Electronically Signed   By: Donavan Foil M.D.   On: 02/11/2019 18:18    EKG: Independently reviewed.  Paced rhythm.  Assessment/Plan Principal Problem:   Acute respiratory failure with hypoxia (HCC) Active Problems:   Type 2 diabetes mellitus with stage 3a chronic kidney disease, without long-term current use of insulin (HCC)   COPD with acute exacerbation (HCC)   Atrial fibrillation, chronic ([place])    1. Acute respiratory failure with hypoxia likely a combination of COPD and persistent pneumonia for which patient is on Levaquin and Solu-Medrol and inhalers.  If COVID-19 test is negative keep patient on nebulizer.  Given that patient's chest x-ray does show bilateral pleural effusion I suspect patient may be having a component of CHF which could be contributing most of the symptoms.  For which  I have  ordered BNP.  If BNP is elevated consider Lasix. 2. Diabetes mellitus type 2 presently not on medications on sliding scale coverage.  During last admission glipizide was held due to kidney disease. 3. A. fib presently on propanolol and apixaban which be continued.  Rate is controlled. 4. History of bradycardia status post pacemaker placement. 5. Chronic kidney disease stage III creatinine appears to be at baseline. 6. Anemia likely from chronic kidney disease.  Hemoglobin appears to be at baseline.  Given that patient has worsening respiratory status will need further work-up and close monitoring with more than 2 midnight stay in inpatient status.   DVT prophylaxis: Apixaban. Code Status: DNR. Family Communication: Patient's daughter. Disposition Plan: Home. Consults called: None. Admission status: Inpatient.   Rise Patience MD [facility] Pager [phone].  If 7PM-7AM, please contact night-coverage [email or web address] Password [email or web address]  02/12/2019, 3:50 AM

## 2019-02-12 NOTE — Discharge Summary (Signed)
[facility] Discharge Summary   Patient: Johnathan Hudson [id number]   PCP: Tracie Harrier, MD DOB: Dec 23, 1933   Date of admission: 02/11/2019   Date of discharge:  02/12/2019    Discharge Diagnoses:  Principal Problem:   Acute respiratory failure with hypoxia ([place]) Active Problems:   Type 2 diabetes mellitus with stage 3a chronic kidney disease, without long-term current use of insulin (HCC)   COPD with acute exacerbation (HCC)   Atrial fibrillation, chronic ([place])   Admitted From: home Disposition:  [facility]   Recommendations for Outpatient Follow-up:   Follow-up Information    Tracie Harrier, MD. Schedule an appointment as soon as possible for a visit in 1 week(s).   Specialty: Internal Medicine Contact information: [place] [address] [phone]          Diet recommendation: Cardiac diet  Activity: The patient is advised to gradually reintroduce usual activities,as tolerated .  Discharge Condition: good  Code Status: DNR   History of present illness: As per the H and P dictated on admission, "Johnathan Hudson is a 85 y.o. male with history of COPD, diabetes mellitus type 2, bradycardia status post pacemaker placement, chronic kidney disease depression and anxiety was just discharged home 5 days ago after being treated for sepsis secondary to pneumonia at the time patient also was found to be in A. fib with RVR for which patient was started on propranolol which also will help with his tremors.  Patient states he was doing fine after discharge but since last night he started becoming short of breath febrile with temperature 100F with wheezing.  Given the persistent shortness of breath patient went to the primary care physician office over the chest x-ray showed worsening infiltrates was referred to the ER. "  Hospital Course:  Summary of his active problems in the hospital is as following.  Acute COVID-19 Viral illness Lab Results  Component Value Date   SARSCOV2NAA POSITIVE (A) 02/11/2019   [place] NEGATIVE 02/04/2019   CXR: hazy bilateral peripheral opacities  Recent Labs    02/12/19 1715  FERRITIN 40    Tmax last 24 hours:  No data recorded.   Oxygen requirements: On room air  Antibiotics: 1 dose of IV ceftriaxone and azithromycin Diuretics: None Vitamin C and Zinc: Started on 02/12/2019 DVT Prophylaxis: Subcutaneous Lovenox . Remdesivir: 02/12/2019 Steroids: Decadron 02/12/2019 Actemra: No indication  Prone positioning: Patient encouraged to stay in prone position as much as possible.  PPE During this encounter: Patient Isolation: No isolation, patient wearing droplet mask HCP PPE: CAPR, gown. gloves Patient PPE: None  The treatment plan and use of medications and known side effects were discussed with patient/family. It was clearly explained that there is no proven definitive treatment for COVID-19 infection yet. Any medications used here are based on case reports/anecdotal data which are not peer-reviewed and has not been studied using randomized control trials.  Complete risks and long-term side effects are unknown, however in the best clinical judgment they seem to be of some clinical benefit rather than medical risks.  Patient/family agree with the treatment plan and want to receive these treatments as indicated.   Diabetes mellitus type 2 presently  not on medications on sliding scale coverage.   During last admission glipizide was held due to kidney disease.  A. fib  presently on propanolol and apixaban which be continued.  Rate is controlled.  History of bradycardia status post pacemaker placement.  Chronic  kidney disease stage III creatinine appears to be at baseline.  Anemia likely from chronic kidney disease.  Hemoglobin appears to be at baseline.  On the day of the discharge the patient's vitals were stable, and no other acute  medical condition were reported by patient.   Consultants: none Procedures: none  DISCHARGE MEDICATION: Allergies as of 02/12/2019      Reactions   Sulfa Antibiotics Rash   Lipitor [atorvastatin] Other (See Comments)   Myalgia      Medication List    STOP taking these medications   doxycycline 100 MG tablet Commonly known as: VIBRA-TABS   ipratropium-albuterol 0.5-2.5 (3) MG/3ML Soln Commonly known as: DUONEB Replaced by: Ipratropium-Albuterol 20-100 MCG/ACT Aers respimat   levofloxacin 500 MG tablet Commonly known as: LEVAQUIN   predniSONE 10 MG tablet Commonly known as: DELTASONE     TAKE these medications   acetaminophen 500 MG tablet Commonly known as: TYLENOL Take 1,000 mg 3 (three) times daily as needed by mouth for moderate pain.   albuterol 108 (90 Base) MCG/ACT inhaler Commonly known as: VENTOLIN HFA Inhale 2 puffs into the lungs every 6 (six) hours as needed for wheezing or shortness of breath.   ALPRAZolam 0.25 MG tablet Commonly known as: XANAX Take 0.25 mg by mouth at bedtime as needed for sleep.   ascorbic acid 500 MG tablet Commonly known as: VITAMIN C Take 1 tablet (500 mg total) by mouth daily.   budesonide-formoterol 80-4.5 MCG/ACT inhaler Commonly known as: Symbicort Inhale 2 puffs into the lungs 2 (two) times daily.   dexamethasone 10 MG/ML injection Commonly known as: DECADRON Inject 0.6 mLs (6 mg total) into the vein daily.   Eliquis 2.5 MG Tabs tablet Generic drug: apixaban Take 2.5 mg by mouth 2 (two) times daily.   gabapentin 300 MG capsule Commonly known as: NEURONTIN Take 300 mg by mouth 2 (two) times daily.   HYDROcodone-acetaminophen 7.5-325 MG tablet Commonly known as: NORCO Take 1 tablet by mouth 3 (three) times daily as needed for pain.   Ipratropium-Albuterol 20-100 MCG/ACT Aers respimat Commonly known as: COMBIVENT Inhale 1 puff into the lungs every 6 (six) hours. Replaces: ipratropium-albuterol 0.5-2.5 (3) MG/3ML  Soln   LORazepam 0.5 MG tablet Commonly known as: ATIVAN Take 1 tablet (0.5 mg total) by mouth 2 (two) times daily.   methenamine 1 g tablet Commonly known as: HIPREX Take 1 g by mouth 2 (two) times daily with a meal.   mupirocin ointment 2 % Commonly known as: BACTROBAN Place 1 application into the nose 2 (two) times daily for 5 days.   omeprazole 20 MG capsule Commonly known as: PRILOSEC Take 20 mg by mouth 2 (two) times daily before a meal.   propranolol 20 MG tablet Commonly known as: INDERAL Take 1 tablet (20 mg total) by mouth 3 (three) times daily.   sertraline 50 MG tablet Commonly known as: ZOLOFT Take 50 mg by mouth daily.   simvastatin 20 MG tablet Commonly known as: ZOCOR Take 20 mg by mouth every evening.   Spiriva HandiHaler 18 MCG inhalation capsule Generic drug: tiotropium Place 1 capsule (18 mcg total) into inhaler and inhale daily.   zinc sulfate 220 (50 Zn) MG capsule Take 1 capsule (220 mg total) by mouth daily.      Allergies  Allergen Reactions  . Sulfa Antibiotics Rash  . Lipitor [Atorvastatin] Other (See Comments)    Myalgia   Discharge Instructions    Diet - low sodium heart healthy  Complete by: As directed    Increase activity slowly   Complete by: As directed      Discharge Exam: There were no vitals filed for this visit. Vitals:   02/12/19 1830 02/12/19 1900  BP: 133/73 136/86  Pulse:    Resp:    Temp:    SpO2:     General: Appear in mild distress, no Rash; Oral Mucosa Clear, moist. no Abnormal Mass Or lumps Cardiovascular: S1 and S2 Present, no Murmur, Respiratory: normal respiratory effort, Bilateral Air entry present and bilateral Crackles, bilateral  wheezes Abdomen: Bowel Sound present, Soft and no tenderness, no hernia Extremities: no Pedal edema, no calf tenderness Neurology: alert and oriented to time, place, and person affect appropriate.  The results of significant diagnostics from this hospitalization  (including imaging, microbiology, ancillary and laboratory) are listed below for reference.    Significant Diagnostic Studies: Dg Chest 2 View  Result Date: 02/11/2019 CLINICAL DATA:  Recent pneumonia, non improving EXAM: CHEST - 2 VIEW COMPARISON:  02/06/2019, 02/04/2019, CT 03/05/2018 FINDINGS: Small bilateral pleural effusions. Patchy airspace disease at the left base. Stable cardiomediastinal silhouette. Left-sided pacing device as before. No pneumothorax. IMPRESSION: 1. Small bilateral pleural effusions with persistent patchy airspace disease at the left base which may reflect residual pneumonia. 2. Borderline to mild cardiomegaly Electronically Signed   By: Donavan Foil M.D.   On: 02/11/2019 18:18   Dg Chest 2 View  Result Date: 02/06/2019 CLINICAL DATA:  Cough, fever and shortness of breath. EXAM: CHEST - 2 VIEW COMPARISON:  02/04/2019 FINDINGS: The patient has taken a better inspiration today. There is cardiomegaly. Pacemaker remains in place. The lungs appear better on today's study which could be due to the better inspiration or there could be improvement in mild edema or infiltrates. No worsening or new finding. Tiny effusions in the posterior costophrenic angles. IMPRESSION: Radiographic improvement since 2 days ago. Better inspiration. The lungs appear essentially clear. Small effusions in the posterior costophrenic angles. Electronically Signed   By: Nelson Chimes M.D.   On: 02/06/2019 09:21   Dg Chest Port 1 View  Result Date: 02/04/2019 CLINICAL DATA:  Fever.  Shortness of breath. EXAM: PORTABLE CHEST 1 VIEW COMPARISON:  February 23, 2015 FINDINGS: The cardiomediastinal silhouette is stable. Stable pacemaker. The right lung is clear. There is mild opacity in the left mid lower lung. No other acute abnormalities. IMPRESSION: Mild opacity in left lung may represent infection or asymmetric edema. Recommend clinical correlation. Electronically Signed   By: Dorise Bullion III M.D   On:  02/04/2019 09:55    Microbiology: Recent Results (from the past 240 hour(s))  Blood Culture (routine x 2)     Status: None   Collection Time: 02/04/19  9:41 AM   Specimen: BLOOD  Result Value Ref Range Status   Specimen Description BLOOD LEFT FA  Final   Special Requests   Final    BOTTLES DRAWN AEROBIC AND ANAEROBIC Blood Culture results may not be optimal due to an excessive volume of blood received in culture bottles   Culture   Final    NO GROWTH 5 DAYS Performed at [facility], [address], [place], [place] [address]    Report Status 02/09/2019 FINAL  Final  Blood Culture (routine x 2)     Status: None   Collection Time: 02/04/19  9:41 AM   Specimen: BLOOD  Result Value Ref Range Status   Specimen Description BLOOD RIGHT HAND  Final   Special  Requests   Final    BOTTLES DRAWN AEROBIC AND ANAEROBIC Blood Culture results may not be optimal due to an excessive volume of blood received in culture bottles   Culture   Final    NO GROWTH 5 DAYS Performed at [facility], [address] [place], [place] [address]    Report Status 02/09/2019 FINAL  Final  Urine culture     Status: Abnormal   Collection Time: 02/04/19  9:46 AM   Specimen: In/Out Cath Urine  Result Value Ref Range Status   Specimen Description   Final    IN/OUT CATH URINE Performed at [facility], [address], [place], [place] [address]    Special Requests   Final    NONE Performed at [facility], [address] [place], [place] [address]    Culture MULTIPLE SPECIES PRESENT, SUGGEST RECOLLECTION (A)  Final   Report Status 02/05/2019 FINAL  Final  SARS CORONAVIRUS 2 (TAT 6-24 HRS) Nasopharyngeal Nasopharyngeal Swab     Status: None   Collection Time: 02/04/19 11:11 AM   Specimen: Nasopharyngeal Swab  Result Value Ref Range Status   SARS Coronavirus 2 NEGATIVE NEGATIVE Final    Comment: (NOTE) SARS-CoV-2 target nucleic acids are NOT DETECTED. The SARS-CoV-2  RNA is generally detectable in upper and lower respiratory specimens during the acute phase of infection. Negative results do not preclude SARS-CoV-2 infection, do not rule out co-infections with other pathogens, and should not be used as the sole basis for treatment or other patient management decisions. Negative results must be combined with clinical observations, patient history, and epidemiological information. The expected result is Negative. Fact Sheet for Patients: SugarRoll.be Fact Sheet for Healthcare Providers: https://www.woods-mathews.com/ This test is not yet approved or cleared by the [place] FDA and  has been authorized for detection and/or diagnosis of SARS-CoV-2 by FDA under an Emergency Use Authorization (EUA). This EUA will remain  in effect (meaning this test can be used) for the duration of the COVID-19 declaration under Section 56 4(b)(1) of the Act, 21 U.S.C. section 360bbb-3(b)(1), unless the authorization is terminated or revoked sooner. Performed at [facility], [place] [address], [place], [place] [address]   MRSA PCR Screening     Status: Abnormal   Collection Time: 02/04/19 11:38 AM  Result Value Ref Range Status   MRSA by PCR POSITIVE (A) NEGATIVE Final    Comment:        The GeneXpert MRSA Assay (FDA approved for NASAL specimens only), is one component of a comprehensive MRSA colonization surveillance program. It is not intended to diagnose MRSA infection nor to guide or monitor treatment for MRSA infections. RESULT CALLED TO, READ BACK BY AND VERIFIED WITH: NOAH GRIFFITH @1318  ON 02/04/2019 BY FMW Performed at [facility], [place], [place] [address]   SARS CORONAVIRUS 2 (TAT 6-24 HRS) Nasopharyngeal Nasopharyngeal Swab     Status: Abnormal   Collection Time: 02/11/19 11:07 PM   Specimen: Nasopharyngeal Swab  Result Value Ref Range Status   SARS Coronavirus 2  POSITIVE (A) NEGATIVE Final    Comment: RESULT CALLED TO, READ BACK BY AND VERIFIED WITH: Karma Greaser RN 14:55 02/12/19 ([email or web address]) (NOTE) SARS-CoV-2 target nucleic acids are DETECTED. The SARS-CoV-2 RNA is generally detectable in upper and lower respiratory specimens during the acute phase of infection. Positive results are indicative of active infection with SARS-CoV-2. Clinical  correlation with patient history and other diagnostic information is necessary to determine  patient infection status. Positive results do  not rule out bacterial infection or co-infection with other viruses. The expected result is Negative. Fact Sheet for Patients: SugarRoll.be Fact Sheet for Healthcare Providers: https://www.woods-mathews.com/ This test is not yet approved or cleared by the [place] FDA and  has been authorized for detection and/or diagnosis of SARS-CoV-2 by FDA under an Emergency Use Authorization (EUA). This EUA will remain  in effect (meaning this test can be used) for  the duration of the COVID-19 declaration under Section 564(b)(1) of the Act, 21 U.S.C. section 360bbb-3(b)(1), unless the authorization is terminated or revoked sooner. Performed at [facility], [place] [address] [place], [place] [address]      Labs: CBC: Recent Labs  Lab 02/11/19 1744 02/12/19 0623 02/12/19 1715  WBC 8.4 5.9 9.8  NEUTROABS 7.1  --  8.2*  HGB 11.6* 11.7* 11.8*  HCT 38.1* 36.0* 37.5*  MCV 96.9 90.7 94.2  PLT 181 177 [id number]   Basic Metabolic Panel: Recent Labs  Lab 02/06/19 0739 02/11/19 1744 02/12/19 0707 02/12/19 1715  NA 140 135 136 140  K 4.9 4.5 4.6 4.5  CL 109 104 103 105  CO2 21* 19* 24 25  GLUCOSE 168* 216* 191* 132*  BUN 29* 30* 30* 35*  CREATININE 1.92* 1.80* 1.54* 1.66*  CALCIUM 8.9 8.5* 8.5* 9.1   Liver Function Tests: Recent Labs  Lab 02/11/19 1744 02/12/19 1715  AST 19 15  ALT 29 23  ALKPHOS 52 46  BILITOT 0.7 0.8   PROT 6.3* 6.5  ALBUMIN 3.3* 3.3*   No results for input(s): LIPASE, AMYLASE in the last 168 hours. No results for input(s): AMMONIA in the last 168 hours. Cardiac Enzymes: No results for input(s): CKTOTAL, CKMB, CKMBINDEX, TROPONINI in the last 168 hours. BNP (last 3 results) Recent Labs    02/12/19 0623 02/12/19 1715  BNP 895.0* 744.0*   CBG: Recent Labs  Lab 02/06/19 2035 02/07/19 0732 02/07/19 1240 02/12/19 0911 02/12/19 1301  GLUCAP 93 95 191* 145* 107*    Time spent: 35 minutes  Signed:  Berle Mull  [facility]  02/12/2019 7:09 PM

## 2019-02-12 NOTE — ED Notes (Signed)
Pt given breakfast tray at this time. 

## 2019-02-12 NOTE — ED Notes (Signed)
Pt was ambulated down the hallway by this RN. Pt oxygen saturations dropped to 86% on RA while ambulating. Pt returned to bed with no issue. Pt was placed on 2L BNC. No further needs expressed. Will continue to monitor.

## 2019-02-12 NOTE — ED Notes (Signed)
Family is aware of the covid being positive. Leaving phone numbers at the bedside.

## 2019-02-13 ENCOUNTER — Inpatient Hospital Stay (HOSPITAL_COMMUNITY)
Admission: AD | Admit: 2019-02-13 | Discharge: 2019-02-17 | DRG: 177 | Disposition: A | Discharge status: Home Health | Payer: Medicare HMO | Source: Other Acute Inpatient Hospital | Attending: Internal Medicine | Admitting: Internal Medicine

## 2019-02-13 ENCOUNTER — Other Ambulatory Visit: Payer: Self-pay

## 2019-02-13 ENCOUNTER — Encounter (HOSPITAL_COMMUNITY): Payer: Self-pay

## 2019-02-13 ENCOUNTER — Inpatient Hospital Stay (HOSPITAL_COMMUNITY): Payer: Medicare HMO

## 2019-02-13 DIAGNOSIS — J988 Other specified respiratory disorders: Secondary | ICD-10-CM

## 2019-02-13 DIAGNOSIS — I351 Nonrheumatic aortic (valve) insufficiency: Secondary | ICD-10-CM

## 2019-02-13 DIAGNOSIS — M069 Rheumatoid arthritis, unspecified: Secondary | ICD-10-CM | POA: Diagnosis present

## 2019-02-13 DIAGNOSIS — E1122 Type 2 diabetes mellitus with diabetic chronic kidney disease: Secondary | ICD-10-CM | POA: Diagnosis present

## 2019-02-13 DIAGNOSIS — Z882 Allergy status to sulfonamides status: Secondary | ICD-10-CM | POA: Diagnosis not present

## 2019-02-13 DIAGNOSIS — N1831 Chronic kidney disease, stage 3a: Secondary | ICD-10-CM | POA: Diagnosis present

## 2019-02-13 DIAGNOSIS — Z7901 Long term (current) use of anticoagulants: Secondary | ICD-10-CM

## 2019-02-13 DIAGNOSIS — E1121 Type 2 diabetes mellitus with diabetic nephropathy: Secondary | ICD-10-CM

## 2019-02-13 DIAGNOSIS — J1282 Pneumonia due to coronavirus disease 2019: Secondary | ICD-10-CM

## 2019-02-13 DIAGNOSIS — U071 COVID-19: Secondary | ICD-10-CM | POA: Diagnosis present

## 2019-02-13 DIAGNOSIS — D638 Anemia in other chronic diseases classified elsewhere: Secondary | ICD-10-CM | POA: Diagnosis not present

## 2019-02-13 DIAGNOSIS — J440 Chronic obstructive pulmonary disease with acute lower respiratory infection: Secondary | ICD-10-CM | POA: Diagnosis not present

## 2019-02-13 DIAGNOSIS — J1289 Other viral pneumonia: Secondary | ICD-10-CM | POA: Diagnosis not present

## 2019-02-13 DIAGNOSIS — Z66 Do not resuscitate: Secondary | ICD-10-CM | POA: Diagnosis present

## 2019-02-13 DIAGNOSIS — G250 Essential tremor: Secondary | ICD-10-CM | POA: Diagnosis present

## 2019-02-13 DIAGNOSIS — I361 Nonrheumatic tricuspid (valve) insufficiency: Secondary | ICD-10-CM | POA: Diagnosis not present

## 2019-02-13 DIAGNOSIS — J9601 Acute respiratory failure with hypoxia: Secondary | ICD-10-CM | POA: Diagnosis not present

## 2019-02-13 DIAGNOSIS — E785 Hyperlipidemia, unspecified: Secondary | ICD-10-CM | POA: Diagnosis present

## 2019-02-13 DIAGNOSIS — Z809 Family history of malignant neoplasm, unspecified: Secondary | ICD-10-CM | POA: Diagnosis not present

## 2019-02-13 DIAGNOSIS — I700 Atherosclerosis of aorta: Secondary | ICD-10-CM | POA: Diagnosis present

## 2019-02-13 DIAGNOSIS — T380X5A Adverse effect of glucocorticoids and synthetic analogues, initial encounter: Secondary | ICD-10-CM | POA: Diagnosis not present

## 2019-02-13 DIAGNOSIS — F329 Major depressive disorder, single episode, unspecified: Secondary | ICD-10-CM | POA: Diagnosis present

## 2019-02-13 DIAGNOSIS — Z888 Allergy status to other drugs, medicaments and biological substances status: Secondary | ICD-10-CM

## 2019-02-13 DIAGNOSIS — N183 Chronic kidney disease, stage 3 unspecified: Secondary | ICD-10-CM

## 2019-02-13 DIAGNOSIS — F17220 Nicotine dependence, chewing tobacco, uncomplicated: Secondary | ICD-10-CM | POA: Diagnosis present

## 2019-02-13 DIAGNOSIS — Z820 Family history of epilepsy and other diseases of the nervous system: Secondary | ICD-10-CM | POA: Diagnosis not present

## 2019-02-13 DIAGNOSIS — F419 Anxiety disorder, unspecified: Secondary | ICD-10-CM | POA: Diagnosis not present

## 2019-02-13 DIAGNOSIS — I429 Cardiomyopathy, unspecified: Secondary | ICD-10-CM | POA: Diagnosis present

## 2019-02-13 DIAGNOSIS — Z7951 Long term (current) use of inhaled steroids: Secondary | ICD-10-CM | POA: Diagnosis not present

## 2019-02-13 DIAGNOSIS — D631 Anemia in chronic kidney disease: Secondary | ICD-10-CM | POA: Diagnosis present

## 2019-02-13 DIAGNOSIS — I482 Chronic atrial fibrillation, unspecified: Secondary | ICD-10-CM | POA: Diagnosis not present

## 2019-02-13 DIAGNOSIS — J90 Pleural effusion, not elsewhere classified: Secondary | ICD-10-CM | POA: Diagnosis not present

## 2019-02-13 DIAGNOSIS — Z950 Presence of cardiac pacemaker: Secondary | ICD-10-CM

## 2019-02-13 DIAGNOSIS — I129 Hypertensive chronic kidney disease with stage 1 through stage 4 chronic kidney disease, or unspecified chronic kidney disease: Secondary | ICD-10-CM | POA: Diagnosis present

## 2019-02-13 DIAGNOSIS — R0602 Shortness of breath: Secondary | ICD-10-CM | POA: Diagnosis present

## 2019-02-13 LAB — COMPREHENSIVE METABOLIC PANEL
ALT: 22 U/L (ref 0–44)
AST: 16 U/L (ref 15–41)
Albumin: 3.3 g/dL — ABNORMAL LOW (ref 3.5–5.0)
Alkaline Phosphatase: 43 U/L (ref 38–126)
Anion gap: 9 (ref 5–15)
BUN: 37 mg/dL — ABNORMAL HIGH (ref 8–23)
CO2: 26 mmol/L (ref 22–32)
Calcium: 8.8 mg/dL — ABNORMAL LOW (ref 8.9–10.3)
Chloride: 104 mmol/L (ref 98–111)
Creatinine, Ser: 1.63 mg/dL — ABNORMAL HIGH (ref 0.61–1.24)
GFR calc Af Amer: 44 mL/min — ABNORMAL LOW (ref >60)
GFR calc non Af Amer: 38 mL/min — ABNORMAL LOW (ref >60)
Glucose, Bld: 108 mg/dL — ABNORMAL HIGH (ref 70–99)
Potassium: 3.9 mmol/L (ref 3.5–5.1)
Sodium: 139 mmol/L (ref 135–145)
Total Bilirubin: 0.7 mg/dL (ref 0.3–1.2)
Total Protein: 6.3 g/dL — ABNORMAL LOW (ref 6.5–8.1)

## 2019-02-13 LAB — ECHOCARDIOGRAM LIMITED
Height: 66 in
Weight: 2624.36 oz

## 2019-02-13 LAB — FERRITIN: Ferritin: 47 ng/mL (ref 24–336)

## 2019-02-13 LAB — GLUCOSE, CAPILLARY
Glucose-Capillary: 100 mg/dL — ABNORMAL HIGH (ref 70–99)
Glucose-Capillary: 97 mg/dL (ref 70–99)

## 2019-02-13 LAB — ABO/RH: ABO/RH(D): O POS

## 2019-02-13 LAB — C-REACTIVE PROTEIN: CRP: 1.5 mg/dL — ABNORMAL HIGH (ref <1.0)

## 2019-02-13 MED ORDER — VITAMIN C 500 MG PO TABS
500.0000 mg | ORAL_TABLET | Freq: Every day | ORAL | Status: DC
  Administered 2019-02-13 – 2019-02-17 (×5): 500 mg via ORAL
  Filled 2019-02-13 (×5): qty 1

## 2019-02-13 MED ORDER — LORAZEPAM 0.5 MG PO TABS
0.5000 mg | ORAL_TABLET | Freq: Two times a day (BID) | ORAL | Status: DC
  Administered 2019-02-13 – 2019-02-14 (×3): 0.5 mg via ORAL
  Filled 2019-02-13 (×3): qty 1

## 2019-02-13 MED ORDER — SODIUM CHLORIDE 0.9 % IV SOLN
100.0000 mg | INTRAVENOUS | Status: AC
  Administered 2019-02-13 – 2019-02-16 (×4): 100 mg via INTRAVENOUS
  Filled 2019-02-13 (×4): qty 100

## 2019-02-13 MED ORDER — APIXABAN 2.5 MG PO TABS
2.5000 mg | ORAL_TABLET | Freq: Two times a day (BID) | ORAL | Status: DC
  Administered 2019-02-13 – 2019-02-17 (×9): 2.5 mg via ORAL
  Filled 2019-02-13 (×12): qty 1

## 2019-02-13 MED ORDER — SODIUM CHLORIDE 0.9 % IV SOLN: 250.0000 mL | INTRAVENOUS | Status: DC | PRN

## 2019-02-13 MED ORDER — PANTOPRAZOLE SODIUM 40 MG PO TBEC
40.0000 mg | DELAYED_RELEASE_TABLET | Freq: Every day | ORAL | Status: DC
  Administered 2019-02-13 – 2019-02-17 (×5): 40 mg via ORAL
  Filled 2019-02-13 (×5): qty 1

## 2019-02-13 MED ORDER — HYDROCODONE-ACETAMINOPHEN 5-325 MG PO TABS
1.0000 | ORAL_TABLET | Freq: Three times a day (TID) | ORAL | Status: DC | PRN
  Administered 2019-02-13 – 2019-02-17 (×11): 1 via ORAL
  Filled 2019-02-13 (×12): qty 1

## 2019-02-13 MED ORDER — GABAPENTIN 300 MG PO CAPS
300.0000 mg | ORAL_CAPSULE | Freq: Two times a day (BID) | ORAL | Status: DC
  Administered 2019-02-13 – 2019-02-17 (×9): 300 mg via ORAL
  Filled 2019-02-13 (×9): qty 1

## 2019-02-13 MED ORDER — PROPRANOLOL HCL 40 MG PO TABS
20.0000 mg | ORAL_TABLET | Freq: Three times a day (TID) | ORAL | Status: DC
  Administered 2019-02-13 – 2019-02-17 (×12): 20 mg via ORAL
  Filled 2019-02-13: qty 2
  Filled 2019-02-13: qty 1
  Filled 2019-02-13 (×7): qty 2
  Filled 2019-02-13 (×3): qty 1
  Filled 2019-02-13 (×5): qty 2
  Filled 2019-02-13: qty 1

## 2019-02-13 MED ORDER — SODIUM CHLORIDE 0.9% FLUSH: 3.0000 mL | INTRAVENOUS | Status: DC | PRN

## 2019-02-13 MED ORDER — ORAL CARE MOUTH RINSE
15.0000 mL | Freq: Two times a day (BID) | OROMUCOSAL | Status: DC
  Administered 2019-02-13 – 2019-02-17 (×8): 15 mL via OROMUCOSAL

## 2019-02-13 MED ORDER — SODIUM CHLORIDE 0.9% FLUSH
3.0000 mL | Freq: Two times a day (BID) | INTRAVENOUS | Status: DC
  Administered 2019-02-13 – 2019-02-16 (×8): 3 mL via INTRAVENOUS

## 2019-02-13 MED ORDER — SERTRALINE HCL 50 MG PO TABS
50.0000 mg | ORAL_TABLET | Freq: Every day | ORAL | Status: DC
  Administered 2019-02-13 – 2019-02-17 (×5): 50 mg via ORAL
  Filled 2019-02-13 (×5): qty 1

## 2019-02-13 MED ORDER — PROPRANOLOL HCL 20 MG PO TABS
20.0000 mg | ORAL_TABLET | Freq: Three times a day (TID) | ORAL | Status: DC
  Filled 2019-02-13 (×2): qty 1

## 2019-02-13 MED ORDER — ALBUTEROL SULFATE HFA 108 (90 BASE) MCG/ACT IN AERS
2.0000 | INHALATION_SPRAY | Freq: Four times a day (QID) | RESPIRATORY_TRACT | Status: DC | PRN
  Filled 2019-02-13: qty 6.7

## 2019-02-13 MED ORDER — SODIUM CHLORIDE 0.9 % IV SOLN: 100.0000 mg | INTRAVENOUS | Status: DC

## 2019-02-13 MED ORDER — DEXAMETHASONE SODIUM PHOSPHATE 10 MG/ML IJ SOLN
6.0000 mg | Freq: Two times a day (BID) | INTRAMUSCULAR | Status: DC
  Administered 2019-02-13 – 2019-02-17 (×9): 6 mg via INTRAVENOUS
  Filled 2019-02-13 (×9): qty 1

## 2019-02-13 MED ORDER — ZINC SULFATE 220 (50 ZN) MG PO CAPS
220.0000 mg | ORAL_CAPSULE | Freq: Every day | ORAL | Status: DC
  Administered 2019-02-13 – 2019-02-17 (×5): 220 mg via ORAL
  Filled 2019-02-13 (×5): qty 1

## 2019-02-13 MED ORDER — MOMETASONE FURO-FORMOTEROL FUM 100-5 MCG/ACT IN AERO
2.0000 | INHALATION_SPRAY | Freq: Two times a day (BID) | RESPIRATORY_TRACT | Status: DC
  Administered 2019-02-13 – 2019-02-17 (×9): 2 via RESPIRATORY_TRACT
  Filled 2019-02-13: qty 8.8

## 2019-02-13 MED ORDER — UMECLIDINIUM BROMIDE 62.5 MCG/INH IN AEPB
1.0000 | INHALATION_SPRAY | Freq: Every day | RESPIRATORY_TRACT | Status: DC
  Administered 2019-02-13 – 2019-02-17 (×5): 1 via RESPIRATORY_TRACT
  Filled 2019-02-13: qty 7

## 2019-02-13 MED ORDER — SIMVASTATIN 20 MG PO TABS
20.0000 mg | ORAL_TABLET | Freq: Every evening | ORAL | Status: DC
  Administered 2019-02-13 – 2019-02-16 (×4): 20 mg via ORAL
  Filled 2019-02-13 (×4): qty 1

## 2019-02-13 MED ORDER — POLYETHYLENE GLYCOL 3350 17 G PO PACK: 17.0000 g | PACK | Freq: Every day | ORAL | Status: DC | PRN

## 2019-02-13 MED ORDER — ONDANSETRON HCL 4 MG PO TABS
4.0000 mg | ORAL_TABLET | Freq: Four times a day (QID) | ORAL | Status: DC | PRN
  Filled 2019-02-13: qty 1

## 2019-02-13 MED ORDER — ONDANSETRON HCL 4 MG/2ML IJ SOLN: 4.0000 mg | Freq: Four times a day (QID) | INTRAMUSCULAR | Status: DC | PRN

## 2019-02-13 MED ORDER — SODIUM CHLORIDE 0.9 % IV SOLN
200.0000 mg | Freq: Once | INTRAVENOUS | Status: DC
  Filled 2019-02-13: qty 40

## 2019-02-13 NOTE — Progress Notes (Signed)
  Echocardiogram 2D Echocardiogram has been performed.  Johnathan Hudson 02/13/2019, 12:31 PM

## 2019-02-13 NOTE — ED Notes (Addendum)
ED TO INPATIENT HANDOFF REPORT  ED Nurse Name and Phone #: Karena Addison [phone]  S Name/Age/Gender Johnathan Hudson 85 y.o. male Room/Bed: ED25A/ED25A  Code Status   Code Status: DNR  Home/SNF/Other Home Patient oriented to: self, place, time and situation Is this baseline? Yes   Triage Complete: Triage complete  Chief Complaint abnormal labs, pneumonia  Triage Note PT from home with c/o pneumonia worsening. States his doctor called him and told him it was worse. PT c/o COB. Pt 97% on RA. Pt A&OX4   Allergies Allergies  Allergen Reactions  . Sulfa Antibiotics Rash  . Lipitor [Atorvastatin] Other (See Comments)    Myalgia    Level of Care/Admitting Diagnosis ED Disposition    ED Disposition Condition [place]: [place] [[id number]]  Level of Care: Med-Surg [16]  Covid Evaluation: Asymptomatic Screening Protocol (No Symptoms)  Diagnosis: Acute respiratory failure with hypoxia [id number]  Admitting Physician: Rise Patience [phone]  Attending Physician: Rise Patience [phone]  Estimated length of stay: past midnight tomorrow  Certification:: I certify this patient will need inpatient services for at least 2 midnights  PT Class (Do Not Modify): Inpatient [101]  PT Acc Code (Do Not Modify): Private [1]       B Medical/Surgery History Past Medical History:  Diagnosis Date  . Anxiety   . Arthritis    rheumatoid  . Benign essential tremor   . Cardiomyopathy ([place])    mild  . Chronic kidney disease    kidney stones  . COPD (chronic obstructive pulmonary disease) ([place])   . Diabetes mellitus without complication ([place])   . Dysrhythmia    atrial fib  . Episodic atrial fibrillation ([place])   . Hyperlipemia   . Hypertension   . Lumbar stenosis with neurogenic claudication   . Osteoarthritis   . Psoriasis   . Renal insufficiency    Past Surgical History:  Procedure Laterality Date  . CYSTOSCOPY  1993 and 2004  .  CYSTOSCOPY WITH URETHRAL DILATATION Left 02/05/2017   Procedure: CYSTOSCOPY WITH URETHRAL DILATATION;  Surgeon: Royston Cowper, MD;  Location: [facility] ORS;  Service: Urology;  Laterality: Left;  . INSERT / REPLACE / REMOVE PACEMAKER    . KIDNEY STONE SURGERY Left   . LASER OF PROSTATE W/ GREEN LIGHT PVP  2004   photovaporization of prostate with greelight laser   . ORIF ANKLE FRACTURE Right   . PACEMAKER INSERTION    . PACEMAKER INSERTION N/A 03/01/2015   Procedure: PACEMAKER CHANGE OUT;  Surgeon: Isaias Cowman, MD;  Location: [facility] ORS;  Service: Cardiovascular;  Laterality: N/A;  . PENILE PROSTHESIS IMPLANT  1995   inflatable  . RETINAL DETACHMENT SURGERY Left   . TONSILLECTOMY       A IV Location/Drains/Wounds Patient Lines/Drains/Airways Status   Active Line/Drains/Airways    Name:   Placement date:   Placement time:   Site:   Days:   Peripheral IV 02/11/19 Right Wrist   02/11/19    2225    Wrist   2   Incision (Closed) 03/01/15 Other (Comment) Other (Comment)   03/01/15    1236     1445   Incision (Closed) 02/05/17 Penis Other (Comment)   02/05/17    1144     738          Intake/Output Last 24 hours  Intake/Output Summary (Last 24 hours) at 02/13/2019 0430 Last data filed at 02/12/2019 0737 Gross per 24  hour  Intake -  Output 500 ml  Net -500 ml    Labs/Imaging Results for orders placed or performed during the hospital encounter of 02/11/19 (from the past 48 hour(s))  Comprehensive metabolic panel     Status: Abnormal   Collection Time: 02/11/19  5:44 PM  Result Value Ref Range   Sodium 135 135 - 145 mmol/L   Potassium 4.5 3.5 - 5.1 mmol/L   Chloride 104 98 - 111 mmol/L   CO2 19 (L) 22 - 32 mmol/L   Glucose, Bld 216 (H) 70 - 99 mg/dL   BUN 30 (H) 8 - 23 mg/dL   Creatinine, Ser 1.80 (H) 0.61 - 1.24 mg/dL   Calcium 8.5 (L) 8.9 - 10.3 mg/dL   Total Protein 6.3 (L) 6.5 - 8.1 g/dL   Albumin 3.3 (L) 3.5 - 5.0 g/dL   AST 19 15 - 41 U/L   ALT 29 0 - 44 U/L    Alkaline Phosphatase 52 38 - 126 U/L   Total Bilirubin 0.7 0.3 - 1.2 mg/dL   GFR calc non Af Amer 34 (L) >60 mL/min   GFR calc Af Amer 39 (L) >60 mL/min   Anion gap 12 5 - 15    Comment: Performed at [facility], [address] [place], [place] [address]  CBC with Differential     Status: Abnormal   Collection Time: 02/11/19  5:44 PM  Result Value Ref Range   WBC 8.4 4.0 - 10.5 K/uL   RBC 3.93 (L) 4.22 - 5.81 MIL/uL   Hemoglobin 11.6 (L) 13.0 - 17.0 g/dL   HCT 38.1 (L) 39.0 - 52.0 %   MCV 96.9 80.0 - 100.0 fL   MCH 29.5 26.0 - 34.0 pg   MCHC 30.4 30.0 - 36.0 g/dL   RDW 13.9 11.5 - 15.5 %   Platelets 181 150 - 400 K/uL   nRBC 0.0 0.0 - 0.2 %   Neutrophils Relative % 84 %   Neutro Abs 7.1 1.7 - 7.7 K/uL   Lymphocytes Relative 6 %   Lymphs Abs 0.5 (L) 0.7 - 4.0 K/uL   Monocytes Relative 4 %   Monocytes Absolute 0.3 0.1 - 1.0 K/uL   Eosinophils Relative 1 %   Eosinophils Absolute 0.1 0.0 - 0.5 K/uL   Basophils Relative 1 %   Basophils Absolute 0.0 0.0 - 0.1 K/uL   Immature Granulocytes 4 %   Abs Immature Granulocytes 0.30 (H) 0.00 - 0.07 K/uL    Comment: Performed at [facility], [place], [place] [address]  Troponin I (High Sensitivity)     Status: Abnormal   Collection Time: 02/11/19  5:44 PM  Result Value Ref Range   Troponin I (High Sensitivity) 22 (H) <18 ng/L    Comment: (NOTE) Elevated high sensitivity troponin I (hsTnI) values and significant  changes across serial measurements may suggest ACS but many other  chronic and acute conditions are known to elevate hsTnI results.  Refer to the "Links" section for chest pain algorithms and additional  guidance. Performed at [facility], [address] [place], [place] [address]   Troponin I (High Sensitivity)     Status: Abnormal   Collection Time: 02/11/19  7:52 PM  Result Value Ref Range   Troponin I (High Sensitivity) 27 (H) <18 ng/L    Comment: (NOTE) Elevated high  sensitivity troponin I (hsTnI) values and significant  changes across serial measurements may suggest ACS but many other  chronic and acute conditions are known to elevate hsTnI results.  Refer to the "Links" section for chest pain algorithms and additional  guidance. Performed at [facility], [place], [place] [address]   SARS CORONAVIRUS 2 (TAT 6-24 HRS) Nasopharyngeal Nasopharyngeal Swab     Status: Abnormal   Collection Time: 02/11/19 11:07 PM   Specimen: Nasopharyngeal Swab  Result Value Ref Range   SARS Coronavirus 2 POSITIVE (A) NEGATIVE    Comment: RESULT CALLED TO, READ BACK BY AND VERIFIED WITH: Karma Greaser RN 14:55 02/12/19 ([email or web address]) (NOTE) SARS-CoV-2 target nucleic acids are DETECTED. The SARS-CoV-2 RNA is generally detectable in upper and lower respiratory specimens during the acute phase of infection. Positive results are indicative of active infection with SARS-CoV-2. Clinical  correlation with patient history and other diagnostic information is necessary to determine patient infection status. Positive results do  not rule out bacterial infection or co-infection with other viruses. The expected result is Negative. Fact Sheet for Patients: SugarRoll.be Fact Sheet for Healthcare Providers: https://www.woods-mathews.com/ This test is not yet approved or cleared by the [place] FDA and  has been authorized for detection and/or diagnosis of SARS-CoV-2 by FDA under an Emergency Use Authorization (EUA). This EUA will remain  in effect (meaning this test can be used) for  the duration of the COVID-19 declaration under Section 564(b)(1) of the Act, 21 U.S.C. section 360bbb-3(b)(1), unless the authorization is terminated or revoked sooner. Performed at [facility], [place] [address], [place], [place] [address]   Brain natriuretic peptide     Status: Abnormal   Collection Time: 02/12/19  6:23 AM   Result Value Ref Range   B Natriuretic Peptide 895.0 (H) 0.0 - 100.0 pg/mL    Comment: Performed at [facility], [address] [place], [place] [address]  CBC     Status: Abnormal   Collection Time: 02/12/19  6:23 AM  Result Value Ref Range   WBC 5.9 4.0 - 10.5 K/uL   RBC 3.97 (L) 4.22 - 5.81 MIL/uL   Hemoglobin 11.7 (L) 13.0 - 17.0 g/dL   HCT 36.0 (L) 39.0 - 52.0 %   MCV 90.7 80.0 - 100.0 fL   MCH 29.5 26.0 - 34.0 pg   MCHC 32.5 30.0 - 36.0 g/dL   RDW 13.8 11.5 - 15.5 %   Platelets 177 150 - 400 K/uL   nRBC 0.0 0.0 - 0.2 %    Comment: Performed at [facility], [place] [place], [place] [id number]  Basic metabolic panel     Status: Abnormal   Collection Time: 02/12/19  7:07 AM  Result Value Ref Range   Sodium 136 135 - 145 mmol/L   Potassium 4.6 3.5 - 5.1 mmol/L   Chloride 103 98 - 111 mmol/L   CO2 24 22 - 32 mmol/L   Glucose, Bld 191 (H) 70 - 99 mg/dL   BUN 30 (H) 8 - 23 mg/dL   Creatinine, Ser 1.54 (H) 0.61 - 1.24 mg/dL   Calcium 8.5 (L) 8.9 - 10.3 mg/dL   GFR calc non Af Amer 41 (L) >60 mL/min   GFR calc Af Amer 48 (L) >60 mL/min   Anion gap 9 5 - 15    Comment: Performed at [facility], [address] [place], [place] [address]  Glucose, capillary     Status: Abnormal   Collection Time: 02/12/19  9:11 AM  Result Value Ref Range   Glucose-Capillary 145 (H) 70 -  99 mg/dL  Glucose, capillary     Status: Abnormal   Collection Time: 02/12/19  1:01 PM  Result Value Ref Range   Glucose-Capillary 107 (H) 70 - 99 mg/dL  Brain natriuretic peptide     Status: Abnormal   Collection Time: 02/12/19  5:15 PM  Result Value Ref Range   B Natriuretic Peptide 744.0 (H) 0.0 - 100.0 pg/mL    Comment: Performed at [facility], [address] [place], [place] [address]  C-reactive protein     Status: Abnormal   Collection Time: 02/12/19  5:15 PM  Result Value Ref Range   CRP 2.2 (H) <1.0 mg/dL    Comment: Performed at [facility], [place] [address], [place], [place] [address]  Comprehensive metabolic panel     Status: Abnormal   Collection Time: 02/12/19  5:15 PM  Result Value Ref Range   Sodium 140 135 - 145 mmol/L   Potassium 4.5 3.5 - 5.1 mmol/L   Chloride 105 98 - 111 mmol/L   CO2 25 22 - 32 mmol/L   Glucose, Bld 132 (H) 70 - 99 mg/dL   BUN 35 (H) 8 - 23 mg/dL   Creatinine, Ser 1.66 (H) 0.61 - 1.24 mg/dL   Calcium 9.1 8.9 - 10.3 mg/dL   Total Protein 6.5 6.5 - 8.1 g/dL   Albumin 3.3 (L) 3.5 - 5.0 g/dL   AST 15 15 - 41 U/L   ALT 23 0 - 44 U/L   Alkaline Phosphatase 46 38 - 126 U/L   Total Bilirubin 0.8 0.3 - 1.2 mg/dL   GFR calc non Af Amer 38 (L) >60 mL/min   GFR calc Af Amer 44 (L) >60 mL/min   Anion gap 10 5 - 15    Comment: Performed at [facility] [address] [place], [place] [address]  CBC with Differential/Platelet     Status: Abnormal   Collection Time: 02/12/19  5:15 PM  Result Value Ref Range   WBC 9.8 4.0 - 10.5 K/uL   RBC 3.98 (L) 4.22 - 5.81 MIL/uL   Hemoglobin 11.8 (L) 13.0 - 17.0 g/dL   HCT 37.5 (L) 39.0 - 52.0 %   MCV 94.2 80.0 - 100.0 fL   MCH 29.6 26.0 - 34.0 pg   MCHC 31.5 30.0 - 36.0 g/dL   RDW 13.9 11.5 - 15.5 %   Platelets 184 150 - 400 K/uL   nRBC 0.0 0.0 - 0.2 %   Neutrophils Relative % 85 %   Neutro Abs 8.2 (H) 1.7 - 7.7 K/uL   Lymphocytes Relative 5 %   Lymphs Abs 0.5 (L) 0.7 - 4.0 K/uL   Monocytes Relative 9 %   Monocytes Absolute 0.9 0.1 - 1.0 K/uL   Eosinophils Relative 0 %   Eosinophils Absolute 0.0 0.0 - 0.5 K/uL   Basophils Relative 0 %   Basophils Absolute 0.0 0.0 - 0.1 K/uL   Immature Granulocytes 1 %   Abs Immature Granulocytes 0.13 (H) 0.00 - 0.07 K/uL    Comment: Performed at [facility], [address] [place], [place] [address]  Fibrin derivatives D-Dimer [facility] only)     Status: Abnormal   Collection Time: 02/12/19  5:15 PM  Result Value Ref Range   Fibrin derivatives D-dimer ([facility]) 607.64 (H) 0.00 - 499.00 ng/mL  (FEU)    Comment: (NOTE) <> Exclusion of Venous Thromboembolism (VTE) - OUTPATIENT ONLY   (Emergency Department or [place])   0-499 ng/ml (FEU):  With a low to intermediate pretest probability                      for VTE this test result excludes the diagnosis                      of VTE.   >499 ng/ml (FEU) : VTE not excluded; additional work up for VTE is                      required. <> Testing on Inpatients and Evaluation of Disseminated Intravascular   Coagulation (DIC) Reference Range:   0-499 ng/ml (FEU) Performed at [facility], [address] [place], [place] [address]   Ferritin     Status: None   Collection Time: 02/12/19  5:15 PM  Result Value Ref Range   Ferritin 40 24 - 336 ng/mL    Comment: Performed at [facility], [address] [place], [place] [address]  Procalcitonin     Status: None   Collection Time: 02/12/19  5:15 PM  Result Value Ref Range   Procalcitonin <0.10 ng/mL    Comment:        Interpretation: PCT (Procalcitonin) <= 0.5 ng/mL: Systemic infection (sepsis) is not likely. Local bacterial infection is possible. (NOTE)       Sepsis PCT Algorithm           Lower Respiratory Tract                                      Infection PCT Algorithm    ----------------------------     ----------------------------         PCT < 0.25 ng/mL                PCT < 0.10 ng/mL         Strongly encourage             Strongly discourage   discontinuation of antibiotics    initiation of antibiotics    ----------------------------     -----------------------------       PCT 0.25 - 0.50 ng/mL            PCT 0.10 - 0.25 ng/mL               OR       >80% decrease in PCT            Discourage initiation of                                            antibiotics      Encourage discontinuation           of antibiotics    ----------------------------     -----------------------------         PCT >= 0.50 ng/mL              PCT 0.26 - 0.50 ng/mL                AND        <80% decrease in PCT             Encourage initiation of  antibiotics       Encourage continuation           of antibiotics    ----------------------------     -----------------------------        PCT >= 0.50 ng/mL                  PCT > 0.50 ng/mL               AND         increase in PCT                  Strongly encourage                                      initiation of antibiotics    Strongly encourage escalation           of antibiotics                                     -----------------------------                                           PCT <= 0.25 ng/mL                                                 OR                                        > 80% decrease in PCT                                     Discontinue / Do not initiate                                             antibiotics Performed at [facility], [address] [place], [place] [address]   Strep pneumoniae urinary antigen     Status: None   Collection Time: 02/12/19  5:18 PM  Result Value Ref Range   Strep Pneumo Urinary Antigen NEGATIVE NEGATIVE    Comment:        Infection due to S. pneumoniae cannot be absolutely ruled out since the antigen present may be below the detection limit of the test. Performed at [facility], [place] [address] [place], [place] [address]   Glucose, capillary     Status: Abnormal   Collection Time: 02/12/19  9:37 PM  Result Value Ref Range   Glucose-Capillary 161 (H) 70 - 99 mg/dL   Dg Chest 2 View  Result Date: 02/11/2019 CLINICAL DATA:  Recent pneumonia, non improving EXAM: CHEST - 2 VIEW COMPARISON:  02/06/2019, 02/04/2019, CT 03/05/2018 FINDINGS: Small bilateral pleural effusions. Patchy airspace disease at the left base. Stable cardiomediastinal silhouette. Left-sided pacing device as before. No  pneumothorax. IMPRESSION: 1. Small bilateral pleural effusions with persistent patchy airspace  disease at the left base which may reflect residual pneumonia. 2. Borderline to mild cardiomegaly Electronically Signed   By: Donavan Foil M.D.   On: 02/11/2019 18:18    Pending Labs Unresulted Labs (From admission, onward)    Start     Ordered   02/13/19 0500  CBC with Differential/Platelet  Daily,   STAT     02/12/19 1534   02/13/19 0500  Comprehensive metabolic panel  Daily,   STAT     02/12/19 1534   02/13/19 0500  C-reactive protein  Daily,   STAT     02/12/19 1534   02/13/19 0500  Fibrin derivatives D-Dimer ([facility] only)  Daily,   STAT     02/12/19 1534   02/13/19 0500  Ferritin  Daily,   STAT     02/12/19 1534   02/12/19 0349  Legionella Pneumophila Serogp 1 Ur Ag  Once,   STAT     02/12/19 0350          Vitals/Pain Today's Vitals   02/12/19 2100 02/12/19 2130 02/12/19 2300 02/13/19 0418  BP: 140/84 127/81  (!) 101/50  Pulse:  64  81  Resp:    12  Temp:      TempSrc:      SpO2:  94%  97%  PainSc:   0-No pain     Isolation Precautions Airborne and Contact precautions  Medications Medications  ALPRAZolam (XANAX) tablet 0.25 mg (0.25 mg Oral Given 02/13/19 0023)  HYDROcodone-acetaminophen (NORCO/VICODIN) 5-325 MG per tablet 1 tablet (1 tablet Oral Given 02/12/19 2133)  propranolol (INDERAL) tablet 20 mg (20 mg Oral Given 02/12/19 2132)  simvastatin (ZOCOR) tablet 20 mg (20 mg Oral Given 02/12/19 1921)  LORazepam (ATIVAN) tablet 0.5 mg (0 mg Oral Hold 02/12/19 2139)  sertraline (ZOLOFT) tablet 50 mg (50 mg Oral Given 02/12/19 1123)  pantoprazole (PROTONIX) EC tablet 40 mg (40 mg Oral Given 02/12/19 1123)  apixaban (ELIQUIS) tablet 2.5 mg (2.5 mg Oral Given 02/13/19 0023)  gabapentin (NEURONTIN) capsule 300 mg (300 mg Oral Given 02/12/19 2132)  albuterol (VENTOLIN HFA) 108 (90 Base) MCG/ACT inhaler 2 puff (has no administration in time range)  mometasone-formoterol (DULERA) 100-5 MCG/ACT inhaler 2 puff (2 puffs Inhalation Given 02/12/19 2129)  acetaminophen  (TYLENOL) tablet 650 mg (has no administration in time range)    Or  acetaminophen (TYLENOL) suppository 650 mg (has no administration in time range)  ondansetron (ZOFRAN) tablet 4 mg (has no administration in time range)    Or  ondansetron (ZOFRAN) injection 4 mg (has no administration in time range)  levofloxacin (LEVAQUIN) IVPB 750 mg (0 mg Intravenous Stopped 02/12/19 0810)  insulin aspart (novoLOG) injection 0-9 Units (0 Units Subcutaneous Not Given 02/12/19 2300)  Ipratropium-Albuterol (COMBIVENT) respimat 1 puff (1 puff Inhalation Not Given 02/13/19 0325)  dexamethasone (DECADRON) injection 6 mg (6 mg Intravenous Given 02/12/19 1714)  guaiFENesin-dextromethorphan (ROBITUSSIN DM) 100-10 MG/5ML syrup 10 mL (has no administration in time range)  chlorpheniramine-HYDROcodone (TUSSIONEX) 10-8 MG/5ML suspension 5 mL (has no administration in time range)  vitamin C (ASCORBIC ACID) tablet 500 mg (500 mg Oral Given 02/12/19 1711)  zinc sulfate capsule 220 mg (220 mg Oral Given 02/12/19 1713)  remdesivir 100 mg in sodium chloride 0.9 % 250 mL IVPB (has no administration in time range)  methylPREDNISolone sodium succinate (SOLU-MEDROL) 125 mg/2 mL injection 125 mg (125 mg Intravenous Given 02/11/19 2303)  ipratropium-albuterol (DUONEB) 0.5-2.5 (3)  MG/3ML nebulizer solution 3 mL (3 mLs Nebulization Given 02/11/19 2303)  vancomycin (VANCOCIN) IVPB 1000 mg/200 mL premix (0 mg Intravenous Stopped 02/12/19 0033)  ceFEPIme (MAXIPIME) 2 g in sodium chloride 0.9 % 100 mL IVPB (0 g Intravenous Stopped 02/11/19 2336)  HYDROcodone-acetaminophen (NORCO/VICODIN) 5-325 MG per tablet 1 tablet (1 tablet Oral Given 02/12/19 0012)  vancomycin (VANCOCIN) IVPB 1000 mg/200 mL premix (0 mg Intravenous Stopped 02/12/19 0241)  remdesivir 200 mg in sodium chloride 0.9 % 250 mL IVPB (0 mg Intravenous Stopped 02/12/19 1757)    Mobility walks with device Low fall risk   Focused Assessments    R Recommendations: See  Admitting Provider Note  Report given to: Elmo Putt, RN

## 2019-02-13 NOTE — Progress Notes (Signed)
Spoke with pt's daughter and gave her update on pt's condition

## 2019-02-13 NOTE — Progress Notes (Addendum)
At 0600 patient arrived on floor, vitals stable, alert and orientedx4. Provider was notified. Patient was advised of visitors and belongings policies.  Curry Dr. Shanon Brow paged again with request for orders.  [id number]: Daughter [name] called and updated on patient status and plan of care. [name] was provided with the patient's hospital phone number as well as the phone to the hospital front desk. [name] was also advised on the visitors policy. No further questions at this time.

## 2019-02-13 NOTE — ED Notes (Signed)
[place] and Medical Necessity documentation reviewed at this time and found to be complete per policy. 

## 2019-02-13 NOTE — H&P (Signed)
History and Physical  Johnathan Hudson [id number] DOB: 21-Mar-1934 DOA: 02/13/2019  PCP: Tracie Harrier, MD Patient coming from: [facility]  I have personally briefly reviewed patient's old medical records in [place]   Chief Complaint: Shortness of breath  HPI: Johnathan Hudson is a 85 y.o. male past medical history of COPD diabetes mellitus type 2 status post pacemaker placement, chronic kidney disease discharged 5 days prior to admission after being treated for sepsis secondary to pneumonia at that time he was also found in A. fib with RVR for which the patient was started on propranolol which also help with the tremors.  See if a 5-day course of IV antibiotics in the hospital and was sent home on oral Levaquin .patient relates he was doing fine until last night when he started having a temperature and became short of breath the ER at [facility] he was found hypoxic chest x-ray showed bilateral pleural effusion and patchy infiltrates with a persistent left lower lobe infiltrate.  Had a hemoglobin of 11.6 white blood cell of 8.4 EKG showed paced rhythm.  His creatinine 1.8, his SARS-CoV-2 test on 02/10/2019 came back positive.  [facility]: He is requiring 1 L of oxygen to keep saturations greater than 95%, he is started empirically on IV vancomycin, Levaquin and steroids.  His procalcitonin was less than 0.1, CRP 82.  Review of Systems: All systems reviewed and apart from history of presenting illness, are negative.  Past Medical History:  Diagnosis Date  . Anxiety   . Arthritis    rheumatoid  . Benign essential tremor   . Cardiomyopathy ([place])    mild  . Chronic kidney disease    kidney stones  . COPD (chronic obstructive pulmonary disease) ([place])   . Diabetes mellitus without complication ([place])   . Dysrhythmia    atrial fib  . Episodic atrial fibrillation ([place])   . Hyperlipemia   . Hypertension   . Lumbar stenosis with neurogenic  claudication   . Osteoarthritis   . Psoriasis   . Renal insufficiency    Past Surgical History:  Procedure Laterality Date  . CYSTOSCOPY  1993 and 2004  . CYSTOSCOPY WITH URETHRAL DILATATION Left 02/05/2017   Procedure: CYSTOSCOPY WITH URETHRAL DILATATION;  Surgeon: Royston Cowper, MD;  Location: [facility] ORS;  Service: Urology;  Laterality: Left;  . INSERT / REPLACE / REMOVE PACEMAKER    . KIDNEY STONE SURGERY Left   . LASER OF PROSTATE W/ GREEN LIGHT PVP  2004   photovaporization of prostate with greelight laser   . ORIF ANKLE FRACTURE Right   . PACEMAKER INSERTION    . PACEMAKER INSERTION N/A 03/01/2015   Procedure: PACEMAKER CHANGE OUT;  Surgeon: Isaias Cowman, MD;  Location: [facility] ORS;  Service: Cardiovascular;  Laterality: N/A;  . PENILE PROSTHESIS IMPLANT  1995   inflatable  . RETINAL DETACHMENT SURGERY Left   . TONSILLECTOMY     Social History:  reports that he has quit smoking. His smokeless tobacco use includes chew. He reports that he does not drink alcohol or use drugs.   Allergies  Allergen Reactions  . Sulfa Antibiotics Rash  . Lipitor [Atorvastatin] Other (See Comments)    Myalgia    Family History  Problem Relation Age of Onset  . Cancer Mother   . Alzheimer's disease Father     Prior to Admission medications   Medication Sig Start Date End Date Taking? Authorizing Provider  acetaminophen (TYLENOL) 500 MG tablet  Take 1,000 mg 3 (three) times daily as needed by mouth for moderate pain.     [provider]  albuterol (VENTOLIN HFA) 108 (90 Base) MCG/ACT inhaler Inhale 2 puffs into the lungs every 6 (six) hours as needed for wheezing or shortness of breath. 02/07/19   Loletha Grayer, MD  ALPRAZolam Duanne Moron) 0.25 MG tablet Take 0.25 mg by mouth at bedtime as needed for sleep. 01/27/19   [provider]  apixaban (ELIQUIS) 2.5 MG TABS tablet Take 2.5 mg by mouth 2 (two) times daily.    [provider]  budesonide-formoterol  (SYMBICORT) 80-4.5 MCG/ACT inhaler Inhale 2 puffs into the lungs 2 (two) times daily. 02/07/19   Loletha Grayer, MD  dexamethasone (DECADRON) 10 MG/ML injection Inject 0.6 mLs (6 mg total) into the vein daily. 02/12/19   Lavina Hamman, MD  gabapentin (NEURONTIN) 300 MG capsule Take 300 mg by mouth 2 (two) times daily.     [provider]  HYDROcodone-acetaminophen (NORCO) 7.5-325 MG tablet Take 1 tablet by mouth 3 (three) times daily as needed for pain. 01/08/19   [provider]  Ipratropium-Albuterol (COMBIVENT) 20-100 MCG/ACT AERS respimat Inhale 1 puff into the lungs every 6 (six) hours. 02/12/19   Lavina Hamman, MD  LORazepam (ATIVAN) 0.5 MG tablet Take 1 tablet (0.5 mg total) by mouth 2 (two) times daily. 02/07/19   Loletha Grayer, MD  methenamine (HIPREX) 1 g tablet Take 1 g by mouth 2 (two) times daily with a meal.    [provider]  omeprazole (PRILOSEC) 20 MG capsule Take 20 mg by mouth 2 (two) times daily before a meal.    [provider]  propranolol (INDERAL) 20 MG tablet Take 1 tablet (20 mg total) by mouth 3 (three) times daily. 02/07/19   Loletha Grayer, MD  sertraline (ZOLOFT) 50 MG tablet Take 50 mg by mouth daily.    [provider]  simvastatin (ZOCOR) 20 MG tablet Take 20 mg by mouth every evening.     [provider]  tiotropium (SPIRIVA HANDIHALER) 18 MCG inhalation capsule Place 1 capsule (18 mcg total) into inhaler and inhale daily. 02/07/19 02/07/20  Loletha Grayer, MD  vitamin C (VITAMIN C) 500 MG tablet Take 1 tablet (500 mg total) by mouth daily. 02/12/19   Lavina Hamman, MD  zinc sulfate 220 (50 Zn) MG capsule Take 1 capsule (220 mg total) by mouth daily. 02/12/19   Lavina Hamman, MD   Physical Exam: Vitals:   02/13/19 0557 02/13/19 0558 02/13/19 0600  BP: (!) 144/95  (!) 133/93  Pulse: 81  83  Resp: 18    Temp: 98.1 F (36.7 C)    TempSrc: Oral    SpO2: 99% 99% 97%  Weight:   74.4 kg   Height:   5\' 6"  (1.676 m)     General exam: Moderately built and nourished patient, lying comfortably supine on the gurney in no obvious distress.  Head, eyes and ENT: Nontraumatic and normocephalic. Pupils equally reacting to light and accommodation. Oral mucosa moist.  Neck: Supple.  No JVD.  Lymphatics: No lymphadenopathy.  Respiratory system: Good air movement with bilateral diffuse crackles, decreased sounds at the lower right lung base  Cardiovascular system: S1 and S2 heard, RRR.  Lower extremity edema no JVD.  Gastrointestinal system: Abdomen is nondistended, soft and nontender. Normal bowel sounds heard. No organomegaly or masses appreciated.  Central nervous system: Alert and oriented. No focal neurological deficits.  Extremities: Symmetric 5  x 5 power. Peripheral pulses symmetrically felt.   Skin: No rashes or acute findings.  Musculoskeletal system: Negative exam.  Psychiatry: Pleasant and cooperative.   Labs on Admission:  Basic Metabolic Panel: Recent Labs  Lab 02/06/19 0739 02/11/19 1744 02/12/19 0707 02/12/19 1715  NA 140 135 136 140  K 4.9 4.5 4.6 4.5  CL 109 104 103 105  CO2 21* 19* 24 25  GLUCOSE 168* 216* 191* 132*  BUN 29* 30* 30* 35*  CREATININE 1.92* 1.80* 1.54* 1.66*  CALCIUM 8.9 8.5* 8.5* 9.1   Liver Function Tests: Recent Labs  Lab 02/11/19 1744 02/12/19 1715  AST 19 15  ALT 29 23  ALKPHOS 52 46  BILITOT 0.7 0.8  PROT 6.3* 6.5  ALBUMIN 3.3* 3.3*   No results for input(s): LIPASE, AMYLASE in the last 168 hours. No results for input(s): AMMONIA in the last 168 hours. CBC: Recent Labs  Lab 02/11/19 1744 02/12/19 0623 02/12/19 1715  WBC 8.4 5.9 9.8  NEUTROABS 7.1  --  8.2*  HGB 11.6* 11.7* 11.8*  HCT 38.1* 36.0* 37.5*  MCV 96.9 90.7 94.2  PLT 181 177 184   Cardiac Enzymes: No results for input(s): CKTOTAL, CKMB, CKMBINDEX, TROPONINI in the last 168 hours.  BNP (last 3 results) No results for input(s): PROBNP in the  last 8760 hours. CBG: Recent Labs  Lab 02/07/19 1240 02/12/19 0911 02/12/19 1301 02/12/19 2137 02/13/19 0621  GLUCAP 191* 145* 107* 161* 100*    Radiological Exams on Admission: Dg Chest 2 View  Result Date: 02/11/2019 CLINICAL DATA:  Recent pneumonia, non improving EXAM: CHEST - 2 VIEW COMPARISON:  02/06/2019, 02/04/2019, CT 03/05/2018 FINDINGS: Small bilateral pleural effusions. Patchy airspace disease at the left base. Stable cardiomediastinal silhouette. Left-sided pacing device as before. No pneumothorax. IMPRESSION: 1. Small bilateral pleural effusions with persistent patchy airspace disease at the left base which may reflect residual pneumonia. 2. Borderline to mild cardiomegaly Electronically Signed   By: Donavan Foil M.D.   On: 02/11/2019 18:18    EKG: Independently reviewed.  Ventricular paced rhythm unchanged compared to previous EKG.  Assessment/Plan Acute respiratory failure with hypoxia due to COVID-19 T pneumonia: Patient is requiring 1 to 2 L to keep saturations greater than 90%. Start empirically on IV Levaquin and Solu-Medrol. He tested positive for COVID-19 on 02/11/2019. We will start him on IV dexamethasone IV remdesivir will continue IV remdesivir for 5 days. Laboratory markers were mildly elevated, his D-dimer is pending this morning. The patient peripherally 16 hours a day. Follow strict I's and O's Daily weights, limit his fluid intake to 1200 cc a day.  Small bilateral pleural effusion: Has bilateral pleural effusions, with an elevated BNP of 900, he has no JVD or hepatojugular reflux on physical exam we will check a 2D echo. Is currently not on a diuretic at home he appears to be euvolemic. I suspect there could be some degree of decompensated heart failure. Restrict his fluids, will check daily weights he has been placed on Decadron which can make him retain fluids so follow weight closely.  Type 2 diabetes mellitus with stage 3a chronic kidney  disease, without long-term current use of insulin (HCC) Leukosis fairly controlled continue sliding scale insulin. He was just placed on Decadron which will make his blood sugar erratic will continue to monitor closely.  Atrial fibrillation, chronic (HCC) Rate controlled on propanolol. Continue Eliquis.  Anxiety and depression: Continue Xanax.  History of bradycardia: Status post pacemaker continue monitoring telemetry.  Chronic kidney disease stage III: Creatinine appears to be at baseline.     DVT Prophylaxis: Eliquis Code Status: full  Family Communication: none  Disposition Plan: unable to determine   Time spent: 120    It is my clinical opinion that admission to INPATIENT is reasonable and necessary in this 85 y.o. male regular history of COPD not oxygen dependent diabetes mellitus bradycardia status post pacemaker, chronic kidney disease stage III anxiety and depression who went to his PCPs office for shortness of breath was Covid positive sent to the ED, was found to be hypoxic at rest, chest x-ray showed bilateral pleural effusions and bilateral infiltrates his BNP was significantly elevated, he had no leukocytosis no fever, but due to his dyspnea he was started on IV remdesivir and steroids.  Given the aforementioned, the predictability of an adverse outcome is felt to be significant. I expect that the patient will require at least 2 midnights in the hospital to treat this condition.  Charlynne Cousins MD [facility]   02/13/2019, 7:24 AM

## 2019-02-13 NOTE — Plan of Care (Signed)

## 2019-02-14 DIAGNOSIS — F329 Major depressive disorder, single episode, unspecified: Secondary | ICD-10-CM

## 2019-02-14 DIAGNOSIS — F419 Anxiety disorder, unspecified: Secondary | ICD-10-CM

## 2019-02-14 LAB — COMPREHENSIVE METABOLIC PANEL
ALT: 20 U/L (ref 0–44)
AST: 16 U/L (ref 15–41)
Albumin: 2.9 g/dL — ABNORMAL LOW (ref 3.5–5.0)
Alkaline Phosphatase: 46 U/L (ref 38–126)
Anion gap: 10 (ref 5–15)
BUN: 42 mg/dL — ABNORMAL HIGH (ref 8–23)
CO2: 24 mmol/L (ref 22–32)
Calcium: 9.1 mg/dL (ref 8.9–10.3)
Chloride: 105 mmol/L (ref 98–111)
Creatinine, Ser: 1.53 mg/dL — ABNORMAL HIGH (ref 0.61–1.24)
GFR calc Af Amer: 48 mL/min — ABNORMAL LOW (ref >60)
GFR calc non Af Amer: 41 mL/min — ABNORMAL LOW (ref >60)
Glucose, Bld: 175 mg/dL — ABNORMAL HIGH (ref 70–99)
Potassium: 4.3 mmol/L (ref 3.5–5.1)
Sodium: 139 mmol/L (ref 135–145)
Total Bilirubin: 0.4 mg/dL (ref 0.3–1.2)
Total Protein: 5.9 g/dL — ABNORMAL LOW (ref 6.5–8.1)

## 2019-02-14 LAB — CBC WITH DIFFERENTIAL/PLATELET
Abs Immature Granulocytes: 0.13 10*3/uL — ABNORMAL HIGH (ref 0.00–0.07)
Basophils Absolute: 0 10*3/uL (ref 0.0–0.1)
Basophils Relative: 0 %
Eosinophils Absolute: 0 10*3/uL (ref 0.0–0.5)
Eosinophils Relative: 0 %
HCT: 37.8 % — ABNORMAL LOW (ref 39.0–52.0)
Hemoglobin: 12 g/dL — ABNORMAL LOW (ref 13.0–17.0)
Immature Granulocytes: 1 %
Lymphocytes Relative: 5 %
Lymphs Abs: 0.5 10*3/uL — ABNORMAL LOW (ref 0.7–4.0)
MCH: 29.9 pg (ref 26.0–34.0)
MCHC: 31.7 g/dL (ref 30.0–36.0)
MCV: 94.3 fL (ref 80.0–100.0)
Monocytes Absolute: 0.4 10*3/uL (ref 0.1–1.0)
Monocytes Relative: 4 %
Neutro Abs: 8.4 10*3/uL — ABNORMAL HIGH (ref 1.7–7.7)
Neutrophils Relative %: 90 %
Platelets: 203 10*3/uL (ref 150–400)
RBC: 4.01 MIL/uL — ABNORMAL LOW (ref 4.22–5.81)
RDW: 14 % (ref 11.5–15.5)
WBC: 9.5 10*3/uL (ref 4.0–10.5)
nRBC: 0 % (ref 0.0–0.2)

## 2019-02-14 LAB — GLUCOSE, CAPILLARY
Glucose-Capillary: 147 mg/dL — ABNORMAL HIGH (ref 70–99)
Glucose-Capillary: 200 mg/dL — ABNORMAL HIGH (ref 70–99)
Glucose-Capillary: 227 mg/dL — ABNORMAL HIGH (ref 70–99)
Glucose-Capillary: 217 mg/dL — ABNORMAL HIGH (ref 70–99)

## 2019-02-14 LAB — HEMOGLOBIN A1C
Hgb A1c MFr Bld: 6.9 % — ABNORMAL HIGH (ref 4.8–5.6)
Mean Plasma Glucose: 151.33 mg/dL

## 2019-02-14 LAB — C-REACTIVE PROTEIN: CRP: 1.1 mg/dL — ABNORMAL HIGH (ref <1.0)

## 2019-02-14 LAB — D-DIMER, QUANTITATIVE: D-Dimer, Quant: 0.38 ug/mL-FEU (ref 0.00–0.50)

## 2019-02-14 MED ORDER — INSULIN ASPART 100 UNIT/ML ~~LOC~~ SOLN
0.0000 [IU] | Freq: Three times a day (TID) | SUBCUTANEOUS | Status: DC
  Administered 2019-02-14 – 2019-02-15 (×2): 1 [IU] via SUBCUTANEOUS
  Administered 2019-02-15 – 2019-02-16 (×2): 3 [IU] via SUBCUTANEOUS
  Administered 2019-02-16: 4 [IU] via SUBCUTANEOUS
  Administered 2019-02-16 – 2019-02-17 (×2): 1 [IU] via SUBCUTANEOUS
  Administered 2019-02-17: 2 [IU] via SUBCUTANEOUS

## 2019-02-14 MED ORDER — LORAZEPAM 0.5 MG PO TABS
0.5000 mg | ORAL_TABLET | Freq: Four times a day (QID) | ORAL | Status: DC | PRN
  Administered 2019-02-14 – 2019-02-17 (×5): 0.5 mg via ORAL
  Filled 2019-02-14 (×7): qty 1

## 2019-02-14 MED ORDER — INSULIN ASPART 100 UNIT/ML ~~LOC~~ SOLN
0.0000 [IU] | Freq: Every day | SUBCUTANEOUS | Status: DC
  Administered 2019-02-14 – 2019-02-15 (×2): 2 [IU] via SUBCUTANEOUS

## 2019-02-14 MED ORDER — INSULIN DETEMIR 100 UNIT/ML ~~LOC~~ SOLN
5.0000 [IU] | Freq: Every day | SUBCUTANEOUS | Status: DC
  Administered 2019-02-14 – 2019-02-17 (×4): 5 [IU] via SUBCUTANEOUS
  Filled 2019-02-14 (×5): qty 0.05

## 2019-02-14 MED ORDER — FUROSEMIDE 10 MG/ML IJ SOLN
20.0000 mg | Freq: Once | INTRAMUSCULAR | Status: AC
  Administered 2019-02-14: 20 mg via INTRAVENOUS
  Filled 2019-02-14: qty 2

## 2019-02-14 MED ORDER — INSULIN ASPART 100 UNIT/ML ~~LOC~~ SOLN
2.0000 [IU] | Freq: Three times a day (TID) | SUBCUTANEOUS | Status: DC
  Administered 2019-02-14 – 2019-02-17 (×10): 2 [IU] via SUBCUTANEOUS

## 2019-02-14 NOTE — Progress Notes (Signed)
PHYSICAL THERAPY EVALUATION:  History of Current Illness: 85 y/o male w/ hx of renal insufficiency, psoaritis, osteoarthritis, lumbar stenosis, HTN, HLD, episodic a-fib, dysrhythmia, DM, COPD, CKD, cardiomyopathy, benign essential tremor, arthritis and anxiety. Pacemaker, ORIF R ankle. was hospitalized 11/11-11/14 w/ sepsis and PNA, re-admitted 11/18 w/ COPD exacerbation and PNA, pt tested + for COVID 19 11/17  Clinical Impression: Pt admitted with above diagnosis. PTA was living home alone and was very independent, states that takes care of 12 goats and other animals at home. Upon d/c he states his daughter will be able to stay with him for some time. Pt currently with functional limitations due to the deficits listed below (see PT Problem List). Pt is able to get in/out bed with SBA- Mod I, able to ambulate approx 32ft with SBA and cues w/ line management on room air and maintain sats in 90s. He does have tremors at rest and also w/ activity but does not seem to hinder mobility much. Pt will benefit from skilled PT to increase his overal independence, activity tolerance and safety with mobility to allow discharge to the venue listed below.       02/14/19 0900  PT Visit Information  Last PT Received On 02/14/19  Assistance Needed +1  History of Present Illness 85 y/o male w/ hx of renal insufficiency, psoaritis, osteoarthritis, lumbar stenosis, HTN, HLD, episodic a-fib, dysrhythmia, DM, COPD, CKD, cardiomyopathy, benign essential tremor, arthritis and anxiety. Pacemaker, ORIF R ankle. was hospitalized 11/11-11/14 w/ sepsis and PNA, re-admitted 11/18 w/ COPD exacerbation and PNA, pt tested + for COVID 19 11/17  Precautions  Precautions Fall;ICD/Pacemaker  Precaution Comments tremor at rest and also with intention  Restrictions  Weight Bearing Restrictions No  Home Living  Family/patient expects to be discharged to: Private residence  Living Arrangements Alone  Available Help at Discharge Family   Type of [place] to enter  Entrance Stairs-Number of Steps 1  Entrance Stairs-Rails None  Home Layout One level  Bathroom Shower/Tub Walk-in Cytogeneticist Yes  Home Equipment Grab bars - toilet;Grab bars - tub/shower;Shower seat;Walker - 2 wheels;Cane - single point;BSC;Wheelchair - manual  Prior Function  Level of Independence Independent  Comments tremor at rest, denies any falls recently  Communication  Communication HOH  Pain Assessment  Pain Assessment No/denies pain  Cognition  Arousal/Alertness Awake/alert  Behavior During Therapy WFL for tasks assessed/performed  Overall Cognitive Status Within Functional Limits for tasks assessed  Upper Extremity Assessment  Upper Extremity Assessment Generalized weakness  Lower Extremity Assessment  Lower Extremity Assessment Generalized weakness  Cervical / Trunk Assessment  Cervical / Trunk Assessment Normal  Bed Mobility  Overal bed mobility Modified Independent  Transfers  Overall transfer level Needs assistance  Equipment used None  Transfers Sit to/from Stand  Sit to Stand Supervision  Ambulation/Gait  Ambulation/Gait assistance Min guard  Gait Distance (Feet) 300 Feet  Assistive device None  Gait Pattern/deviations Step-through pattern;Wide base of support  Balance  Overall balance assessment Needs assistance  Sitting-balance support Feet unsupported  Sitting balance-Leahy Scale Good  Standing balance support During functional activity  Standing balance-Leahy Scale Fair  General Comments  General comments (skin integrity, edema, etc.) Pt was on room air during this assessment and managed to maintain 02 sats in 90s.   Exercises  Exercises Other exercises  Other Exercises  Other Exercises reinforced use of IS and also flutter valve  PT - End of Session  Activity Tolerance Patient tolerated treatment well  Patient left in chair;with call bell/phone  within reach  Nurse Communication Mobility status  PT Assessment  PT Recommendation/Assessment Patient needs continued PT services  PT Visit Diagnosis Other abnormalities of gait and mobility (R26.89);Muscle weakness (generalized) (M62.81)  PT Problem List Decreased activity tolerance;Decreased mobility;Decreased coordination;Decreased safety awareness  PT Plan  PT Frequency (ACUTE ONLY) Min 3X/week  PT Treatment/Interventions (ACUTE ONLY) Gait training;Functional mobility training;Therapeutic activities;Therapeutic exercise;Balance training;Neuromuscular re-education;Patient/family education  AM-PAC PT "6 Clicks" Mobility Outcome Measure (Version 2)  Help needed turning from your back to your side while in a flat bed without using bedrails? 4  Help needed moving from lying on your back to sitting on the side of a flat bed without using bedrails? 4  Help needed moving to and from a bed to a chair (including a wheelchair)? 3  Help needed standing up from a chair using your arms (e.g., wheelchair or bedside chair)? 3  Help needed to walk in hospital room? 3  Help needed climbing 3-5 steps with a railing?  2  6 Click Score 19  Consider Recommendation of Discharge To: Home with [facility]  PT Recommendation  Recommendations for Other Services OT consult  Follow Up Recommendations Home health PT  PT equipment None recommended by PT  Individuals Consulted  Consulted and Agree with Results and Recommendations Patient  Acute Rehab PT Goals  Patient Stated Goal to go home  PT Goal Formulation With patient  Time For Goal Achievement 02/28/19  Potential to Achieve Goals Good  PT Time Calculation  PT Start Time (ACUTE ONLY) [id number]  PT Stop Time (ACUTE ONLY) 1003  PT Time Calculation (min) (ACUTE ONLY) 25 min  PT General Charges  $$ ACUTE PT VISIT 1 Visit  PT Evaluation  $PT Eval Moderate Complexity 1 Mod  PT Treatments  $Gait Training 8-22 mins  Written Expression  Dominant Hand Right    Horald Chestnut, PT

## 2019-02-14 NOTE — Progress Notes (Signed)
[facility] PROGRESS NOTE    Progress Note  Johnathan Hudson  [id number] DOB: 07/30/1933 DOA: 02/13/2019 PCP: Tracie Harrier, MD     Brief Narrative:   Johnathan Hudson is an 85 y.o. male past medical history of COPD, diabetes mellitus, status post pacemaker placement, coronary disease stage III, discharged 5 days prior to admission after being treated for sepsis secondary to pneumonia at that time he was also found to be in A. fib with RVR, he was placed on propanolol which also help with essential tremors and Eliquis.  Complete his course of empiric antibiotics went to his PCP found him to be fatigued and was complaining of shortness of breath due to SARS-CoV-2 PCR which was positive on 02/10/2019.  He was referred to [facility] regional.  Keep saturations greater than 94%, will start empirically on IV vancomycin Levaquin and steroids.  Assessment/Plan:   Acute Respiratory failure with hypoxia due to COVID-19 viral pneumonitis: Currently on RA sating > 95% on 2 L of oxygen. His inflammatory markers are slowly improving. His procalcitonin was less than 0.1 so empiric antibiotics was discontinued. Continue IV remdesivir steroids, vitamin C and zinc. Still physical therapy, continue strict I's and O's Daily weight. Try to keep the patient prone for early 16 hours a day. He has Small bilateral pleural effusion we will limit fluid intake to 1200 cc a day.  See below for further details.  Small bilateral pleural effusions: With elevated BNP, he appears to be euvolemic on physical exam, no JVD or hepatojugular reflux. Echo was done that showed an EF of 60%, left ventricular diastolic function could not be evaluated.  2D echo showed no wall motion abnormalities.  The right ventricle had a normal systolic function. Left atrial size was dilated right atrial size was normal.  Valves appear to be fairly normal.  Mild aortic regurgitation and mild aortic sclerosis without stenosis.  Inferior vena cava was mildly dilated with more than 50% respiratory variability suggesting elevated right atrial pressure. On physical exam he appears to be euvolemic, no JVD and negative negative hepatojugular reflux.  Type 2 diabetes mellitus with stage 3a chronic kidney disease, without long-term current use of insulin (HCC) Blood glucose is fairly controlled on sliding scale insulin, he is currently on Decadron which Make his blood glucose hard to control. We will start him low long dose acting insulin.  Atrial fibrillation, chronic (HCC) Rate controlled propranolol. Continue Eliquis.  Anxiety and depression: Continue Xanax.  History of bradycardia: Status post pacemaker. Discontinue telemetry.  Chronic kidney disease stage III: Creatinine to be at appears to be at baseline.  DVT prophylaxis: Eliquis Family Communication:none Disposition Plan/Barrier to D/C: home once he finishes remdesivir treatment Code Status:     Code Status Orders  (From admission, onward)         Start     Ordered   02/13/19 2337  Do not attempt resuscitation (DNR)  Continuous    Question Answer Comment  In the event of cardiac or respiratory ARREST Do not call a "code blue"   In the event of cardiac or respiratory ARREST Do not perform Intubation, CPR, defibrillation or ACLS   In the event of cardiac or respiratory ARREST Use medication by any route, position, wound care, and other measures to relive pain and suffering. May use oxygen, suction and manual treatment of airway obstruction as needed for comfort.      02/13/19 2336        Code Status History  Date Active Date Inactive Code Status Order ID Comments User Context   02/13/2019 0720 02/13/2019 2336 Full Code [id number]  Charlynne Cousins, MD Inpatient   02/12/2019 0350 02/13/2019 0547 DNR [id number]  Rise Patience, MD ED   02/04/2019 1153 02/07/2019 1905 DNR [id number]  Loletha Grayer, MD ED   02/05/2017 1246 02/05/2017  1708 Full Code [id number]  Royston Cowper, MD Inpatient   03/01/2015 1315 03/01/2015 1740 Full Code [id number]  Isaias Cowman, MD Inpatient   Advance Care Planning Activity        IV Access:    Peripheral IV   Procedures and diagnostic studies:   No results found.   Medical Consultants:    None.  Anti-Infectives:   IV remdesivir  Subjective:    Johnathan Hudson relates he still short of breath when he ambulates but he feels slightly better than when he came in.  Objective:    Vitals:   02/13/19 2231 02/14/19 0000 02/14/19 0400 02/14/19 0455  BP: 120/79 117/80 128/79   Pulse: 67 72 60   Resp:  19 18   Temp:  98.3 F (36.8 C) 98.6 F (37 C)   TempSrc:  Oral Oral   SpO2:  94% 93%   Weight:    73.7 kg  Height:       SpO2: 93 % O2 Flow Rate (L/min): 2 L/min   Intake/Output Summary (Last 24 hours) at 02/14/2019 0733 Last data filed at 02/14/2019 0322 Gross per 24 hour  Intake 253 ml  Output 550 ml  Net -297 ml   Filed Weights   02/13/19 0600 02/14/19 0455  Weight: 74.4 kg 73.7 kg    Exam: General exam: In no acute distress. Respiratory system: Good air movement and diffuse crackles at bases. Cardiovascular system: S1 & S2 heard, RRR. No JVD.  Gastrointestinal system: Abdomen is nondistended, soft and nontender.  Central nervous system: Alert and oriented. No focal neurological deficits. Extremities: No pedal edema. Skin: No rashes, lesions or ulcers Psychiatry: Judgement and insight appear normal. Mood & affect appropriate.    Data Reviewed:    Labs: Basic Metabolic Panel: Recent Labs  Lab 02/11/19 1744 02/12/19 0707 02/12/19 1715 02/13/19 0800 02/14/19 0520  NA 135 136 140 139 139  K 4.5 4.6 4.5 3.9 4.3  CL 104 103 105 104 105  CO2 19* 24 25 26 24   GLUCOSE 216* 191* 132* 108* 175*  BUN 30* 30* 35* 37* 42*  CREATININE 1.80* 1.54* 1.66* 1.63* 1.53*  CALCIUM 8.5* 8.5* 9.1 8.8* 9.1   GFR Estimated Creatinine Clearance: 33  mL/min (A) (by C-G formula based on SCr of 1.53 mg/dL (H)). Liver Function Tests: Recent Labs  Lab 02/11/19 1744 02/12/19 1715 02/13/19 0800 02/14/19 0520  AST 19 15 16 16   ALT 29 23 22 20   ALKPHOS 52 46 43 46  BILITOT 0.7 0.8 0.7 0.4  PROT 6.3* 6.5 6.3* 5.9*  ALBUMIN 3.3* 3.3* 3.3* 2.9*   No results for input(s): LIPASE, AMYLASE in the last 168 hours. No results for input(s): AMMONIA in the last 168 hours. Coagulation profile No results for input(s): INR, PROTIME in the last 168 hours. COVID-19 Labs  Recent Labs    02/12/19 1715 02/13/19 0800 02/14/19 0520  FERRITIN 40 47  --   CRP 2.2* 1.5* 1.1*    Lab Results  Component Value Date   SARSCOV2NAA POSITIVE (A) 02/11/2019   [place] NEGATIVE 02/04/2019    CBC: Recent Labs  Lab 02/11/19 1744 02/12/19  [id number] 02/12/19 1715 02/14/19 0520  WBC 8.4 5.9 9.8 9.5  NEUTROABS 7.1  --  8.2* 8.4*  HGB 11.6* 11.7* 11.8* 12.0*  HCT 38.1* 36.0* 37.5* 37.8*  MCV 96.9 90.7 94.2 94.3  PLT 181 177 184 203   Cardiac Enzymes: No results for input(s): CKTOTAL, CKMB, CKMBINDEX, TROPONINI in the last 168 hours. BNP (last 3 results) No results for input(s): PROBNP in the last 8760 hours. CBG: Recent Labs  Lab 02/12/19 0911 02/12/19 1301 02/12/19 2137 02/13/19 0621 02/13/19 0811  GLUCAP 145* 107* 161* 100* 97   D-Dimer: No results for input(s): DDIMER in the last 72 hours. Hgb A1c: No results for input(s): HGBA1C in the last 72 hours. Lipid Profile: No results for input(s): CHOL, HDL, LDLCALC, TRIG, CHOLHDL, LDLDIRECT in the last 72 hours. Thyroid function studies: No results for input(s): TSH, T4TOTAL, T3FREE, THYROIDAB in the last 72 hours.  Invalid input(s): FREET3 Anemia work up: Recent Labs    02/12/19 1715 02/13/19 0800  FERRITIN 40 47   Sepsis Labs: Recent Labs  Lab 02/11/19 1744 02/12/19 0623 02/12/19 1715 02/14/19 0520  PROCALCITON  --   --  <0.10  --   WBC 8.4 5.9 9.8 9.5   Microbiology  Recent Results (from the past 240 hour(s))  Blood Culture (routine x 2)     Status: None   Collection Time: 02/04/19  9:41 AM   Specimen: BLOOD  Result Value Ref Range Status   Specimen Description BLOOD LEFT FA  Final   Special Requests   Final    BOTTLES DRAWN AEROBIC AND ANAEROBIC Blood Culture results may not be optimal due to an excessive volume of blood received in culture bottles   Culture   Final    NO GROWTH 5 DAYS Performed at [facility], [address] [place], [place] [address]    Report Status 02/09/2019 FINAL  Final  Blood Culture (routine x 2)     Status: None   Collection Time: 02/04/19  9:41 AM   Specimen: BLOOD  Result Value Ref Range Status   Specimen Description BLOOD RIGHT HAND  Final   Special Requests   Final    BOTTLES DRAWN AEROBIC AND ANAEROBIC Blood Culture results may not be optimal due to an excessive volume of blood received in culture bottles   Culture   Final    NO GROWTH 5 DAYS Performed at [facility], [address] [place], [place] [address]    Report Status 02/09/2019 FINAL  Final  Urine culture     Status: Abnormal   Collection Time: 02/04/19  9:46 AM   Specimen: In/Out Cath Urine  Result Value Ref Range Status   Specimen Description   Final    IN/OUT CATH URINE Performed at [facility], [address] [place], [place] [address]    Special Requests   Final    NONE Performed at [facility], [address] [place], [place] [address]    Culture MULTIPLE SPECIES PRESENT, SUGGEST RECOLLECTION (A)  Final   Report Status 02/05/2019 FINAL  Final  SARS CORONAVIRUS 2 (TAT 6-24 HRS) Nasopharyngeal Nasopharyngeal Swab     Status: None   Collection Time: 02/04/19 11:11 AM   Specimen: Nasopharyngeal Swab  Result Value Ref Range Status   SARS Coronavirus 2 NEGATIVE NEGATIVE Final    Comment: (NOTE) SARS-CoV-2 target nucleic acids are NOT DETECTED. The SARS-CoV-2 RNA is generally detectable in upper  and lower respiratory specimens during the acute phase  of infection. Negative results do not preclude SARS-CoV-2 infection, do not rule out co-infections with other pathogens, and should not be used as the sole basis for treatment or other patient management decisions. Negative results must be combined with clinical observations, patient history, and epidemiological information. The expected result is Negative. Fact Sheet for Patients: SugarRoll.be Fact Sheet for Healthcare Providers: https://www.woods-mathews.com/ This test is not yet approved or cleared by the [place] FDA and  has been authorized for detection and/or diagnosis of SARS-CoV-2 by FDA under an Emergency Use Authorization (EUA). This EUA will remain  in effect (meaning this test can be used) for the duration of the COVID-19 declaration under Section 56 4(b)(1) of the Act, 21 U.S.C. section 360bbb-3(b)(1), unless the authorization is terminated or revoked sooner. Performed at [facility], [place] [address], [place], [place] [address]   MRSA PCR Screening     Status: Abnormal   Collection Time: 02/04/19 11:38 AM  Result Value Ref Range Status   MRSA by PCR POSITIVE (A) NEGATIVE Final    Comment:        The GeneXpert MRSA Assay (FDA approved for NASAL specimens only), is one component of a comprehensive MRSA colonization surveillance program. It is not intended to diagnose MRSA infection nor to guide or monitor treatment for MRSA infections. RESULT CALLED TO, READ BACK BY AND VERIFIED WITH: NOAH GRIFFITH @1318  ON 02/04/2019 BY FMW Performed at [facility], [place], [place] [address]   SARS CORONAVIRUS 2 (TAT 6-24 HRS) Nasopharyngeal Nasopharyngeal Swab     Status: Abnormal   Collection Time: 02/11/19 11:07 PM   Specimen: Nasopharyngeal Swab  Result Value Ref Range Status   SARS Coronavirus 2 POSITIVE (A) NEGATIVE Final    Comment:  RESULT CALLED TO, READ BACK BY AND VERIFIED WITH: Karma Greaser RN 14:55 02/12/19 ([email or web address]) (NOTE) SARS-CoV-2 target nucleic acids are DETECTED. The SARS-CoV-2 RNA is generally detectable in upper and lower respiratory specimens during the acute phase of infection. Positive results are indicative of active infection with SARS-CoV-2. Clinical  correlation with patient history and other diagnostic information is necessary to determine patient infection status. Positive results do  not rule out bacterial infection or co-infection with other viruses. The expected result is Negative. Fact Sheet for Patients: SugarRoll.be Fact Sheet for Healthcare Providers: https://www.woods-mathews.com/ This test is not yet approved or cleared by the [place] FDA and  has been authorized for detection and/or diagnosis of SARS-CoV-2 by FDA under an Emergency Use Authorization (EUA). This EUA will remain  in effect (meaning this test can be used) for  the duration of the COVID-19 declaration under Section 564(b)(1) of the Act, 21 U.S.C. section 360bbb-3(b)(1), unless the authorization is terminated or revoked sooner. Performed at [facility], [place] [address], [place], [place] [address]      Medications:   . apixaban  2.5 mg Oral BID  . dexamethasone  6 mg Intravenous Q12H  . gabapentin  300 mg Oral BID  . LORazepam  0.5 mg Oral BID  . mouth rinse  15 mL Mouth Rinse BID  . mometasone-formoterol  2 puff Inhalation BID  . pantoprazole  40 mg Oral Daily  . propranolol  20 mg Oral TID  . sertraline  50 mg Oral Daily  . simvastatin  20 mg Oral QPM  . sodium chloride flush  3 mL Intravenous Q12H  . umeclidinium bromide  1 puff Inhalation Daily  . ascorbic acid  500 mg Oral  Daily  . zinc sulfate  220 mg Oral Daily   Continuous Infusions: . sodium chloride    . remdesivir 100 mg in NS 250 mL Stopped (02/13/19 0921)     LOS: 1 day   Charlynne Cousins  [facility]  02/14/2019, 7:33 AM

## 2019-02-15 LAB — COMPREHENSIVE METABOLIC PANEL
ALT: 19 U/L (ref 0–44)
AST: 15 U/L (ref 15–41)
Albumin: 3.3 g/dL — ABNORMAL LOW (ref 3.5–5.0)
Alkaline Phosphatase: 49 U/L (ref 38–126)
Anion gap: 12 (ref 5–15)
BUN: 46 mg/dL — ABNORMAL HIGH (ref 8–23)
CO2: 23 mmol/L (ref 22–32)
Calcium: 9.1 mg/dL (ref 8.9–10.3)
Chloride: 101 mmol/L (ref 98–111)
Creatinine, Ser: 1.56 mg/dL — ABNORMAL HIGH (ref 0.61–1.24)
GFR calc Af Amer: 47 mL/min — ABNORMAL LOW (ref >60)
GFR calc non Af Amer: 40 mL/min — ABNORMAL LOW (ref >60)
Glucose, Bld: 127 mg/dL — ABNORMAL HIGH (ref 70–99)
Potassium: 4 mmol/L (ref 3.5–5.1)
Sodium: 136 mmol/L (ref 135–145)
Total Bilirubin: 0.5 mg/dL (ref 0.3–1.2)
Total Protein: 6 g/dL — ABNORMAL LOW (ref 6.5–8.1)

## 2019-02-15 LAB — GLUCOSE, CAPILLARY
Glucose-Capillary: 152 mg/dL — ABNORMAL HIGH (ref 70–99)
Glucose-Capillary: 269 mg/dL — ABNORMAL HIGH (ref 70–99)
Glucose-Capillary: 107 mg/dL — ABNORMAL HIGH (ref 70–99)
Glucose-Capillary: 241 mg/dL — ABNORMAL HIGH (ref 70–99)

## 2019-02-15 LAB — CBC WITH DIFFERENTIAL/PLATELET
Abs Immature Granulocytes: 0.1 10*3/uL — ABNORMAL HIGH (ref 0.00–0.07)
Basophils Absolute: 0 10*3/uL (ref 0.0–0.1)
Basophils Relative: 0 %
Eosinophils Absolute: 0 10*3/uL (ref 0.0–0.5)
Eosinophils Relative: 0 %
HCT: 38.7 % — ABNORMAL LOW (ref 39.0–52.0)
Hemoglobin: 12.5 g/dL — ABNORMAL LOW (ref 13.0–17.0)
Immature Granulocytes: 1 %
Lymphocytes Relative: 6 %
Lymphs Abs: 0.6 10*3/uL — ABNORMAL LOW (ref 0.7–4.0)
MCH: 30 pg (ref 26.0–34.0)
MCHC: 32.3 g/dL (ref 30.0–36.0)
MCV: 93 fL (ref 80.0–100.0)
Monocytes Absolute: 0.6 10*3/uL (ref 0.1–1.0)
Monocytes Relative: 6 %
Neutro Abs: 9.6 10*3/uL — ABNORMAL HIGH (ref 1.7–7.7)
Neutrophils Relative %: 87 %
Platelets: 236 10*3/uL (ref 150–400)
RBC: 4.16 MIL/uL — ABNORMAL LOW (ref 4.22–5.81)
RDW: 13.7 % (ref 11.5–15.5)
WBC: 10.9 10*3/uL — ABNORMAL HIGH (ref 4.0–10.5)
nRBC: 0 % (ref 0.0–0.2)

## 2019-02-15 LAB — LEGIONELLA PNEUMOPHILA SEROGP 1 UR AG: L. pneumophila Serogp 1 Ur Ag: NEGATIVE

## 2019-02-15 LAB — D-DIMER, QUANTITATIVE: D-Dimer, Quant: 0.31 ug/mL-FEU (ref 0.00–0.50)

## 2019-02-15 LAB — C-REACTIVE PROTEIN: CRP: 1.6 mg/dL — ABNORMAL HIGH (ref <1.0)

## 2019-02-15 NOTE — Progress Notes (Signed)
[facility] PROGRESS NOTE    Progress Note  Johnathan Hudson  [id number] DOB: 09/16/1933 DOA: 02/13/2019 PCP: Tracie Harrier, MD     Brief Narrative:   Johnathan Hudson is an 85 y.o. male past medical history of COPD, diabetes mellitus, status post pacemaker placement, coronary disease stage III, discharged 5 days prior to admission after being treated for sepsis secondary to pneumonia at that time he was also found to be in A. fib with RVR, he was placed on propanolol which also help with essential tremors and Eliquis.  Complete his course of empiric antibiotics went to his PCP found him to be fatigued and was complaining of shortness of breath due to SARS-CoV-2 PCR which was positive on 02/10/2019.  He was referred to [facility] regional.  Keep saturations greater than 94%, will start empirically on IV vancomycin Levaquin and steroids.  Assessment/Plan:   Acute Respiratory failure with hypoxia due to COVID-19 viral pneumonitis: Has been weaned to room air, satting greater than 92%. Inflammatory markers continue to improve. Continue IV steroids, remdesivir, vitamin C and zinc. Continue strict I's and O's and daily weights. Has been evaluated with physical therapy and awaiting recommendation whether he would need home health PT or skilled nursing facility level care. Mild leukocytosis likely due to steroids.  Small bilateral pleural effusions: BNP elevated on admission, he had no JVD on physical exam but positive hepatojugular reflux.  His 2D echo showed preserved systolic function unable to evaluate diastolic dysfunction. At that showed the inferior very cava was mildly dilated with more than 50% respiratory variability suggesting volume overload. He was given a single dose of Lasix and his fluid was restricted. He is about 2 L negative. Discontinue Lasix he has no hepatojugular reflux on physical exam his lungs sound more clear today.  Type 2 diabetes mellitus with stage 3a  chronic kidney disease, without long-term current use of insulin (HCC) Blood glucose is fairly controlled continue sliding scale insulin and long-acting insulin. A1c was 6.9.  Atrial fibrillation, chronic (HCC) Rate controlled propranolol. Continue Eliquis.  Anxiety and depression: Continue Xanax.  History of bradycardia: Status post pacemaker. Discontinue telemetry.  Chronic kidney disease stage III: Creatinine to be at appears to be at baseline.  DVT prophylaxis: Eliquis Family Communication:none Disposition Plan/Barrier to D/C: home once he finishes remdesivir treatment Code Status:     Code Status Orders  (From admission, onward)         Start     Ordered   02/13/19 2337  Do not attempt resuscitation (DNR)  Continuous    Question Answer Comment  In the event of cardiac or respiratory ARREST Do not call a "code blue"   In the event of cardiac or respiratory ARREST Do not perform Intubation, CPR, defibrillation or ACLS   In the event of cardiac or respiratory ARREST Use medication by any route, position, wound care, and other measures to relive pain and suffering. May use oxygen, suction and manual treatment of airway obstruction as needed for comfort.      02/13/19 2336        Code Status History    Date Active Date Inactive Code Status Order ID Comments User Context   02/13/2019 0720 02/13/2019 2336 Full Code [id number]  Charlynne Cousins, MD Inpatient   02/12/2019 0350 02/13/2019 0547 DNR [id number]  Rise Patience, MD ED   02/04/2019 1153 02/07/2019 1905 DNR [id number]  Loletha Grayer, MD ED   02/05/2017 1246 02/05/2017 1708 Full Code [id number]  Royston Cowper, MD Inpatient   03/01/2015 1315 03/01/2015 1740 Full Code [id number]  Isaias Cowman, MD Inpatient   Advance Care Planning Activity        IV Access:    Peripheral IV   Procedures and diagnostic studies:   No results found.   Medical Consultants:    None.  Anti-Infectives:    IV remdesivir  Subjective:    Johnathan Hudson relates his shortness of breath is significantly improved, he is in a happy mood sitting up in the chair actually with a good posture.  Objective:    Vitals:   02/15/19 0300 02/15/19 0400 02/15/19 0500 02/15/19 0600  BP:  106/71    Pulse: 67 65 62 (!) 59  Resp:      Temp:      TempSrc:      SpO2: 94% 94% 92% 94%  Weight:      Height:       SpO2: 94 % O2 Flow Rate (L/min): 2 L/min   Intake/Output Summary (Last 24 hours) at 02/15/2019 0800 Last data filed at 02/15/2019 0200 Gross per 24 hour  Intake 240 ml  Output 1750 ml  Net -1510 ml   Filed Weights   02/13/19 0600 02/14/19 0455 02/15/19 0250  Weight: 74.4 kg 73.7 kg 70.8 kg    Exam: General exam: In no acute distress. Respiratory system: Good air movement and clear to auscultation. Cardiovascular system: S1 & S2 heard, RRR. No JVD, negative hepatojugular reflux. Gastrointestinal system: Abdomen is nondistended, soft and nontender.  Central nervous system: Alert and oriented. No focal neurological deficits. Extremities: No pedal edema. Skin: No rashes, lesions or ulcers Psychiatry: Judgement and insight appear normal. Mood & affect appropriate.   Data Reviewed:    Labs: Basic Metabolic Panel: Recent Labs  Lab 02/12/19 0707 02/12/19 1715 02/13/19 0800 02/14/19 0520 02/15/19 0010  NA 136 140 139 139 136  K 4.6 4.5 3.9 4.3 4.0  CL 103 105 104 105 101  CO2 24 25 26 24 23   GLUCOSE 191* 132* 108* 175* 127*  BUN 30* 35* 37* 42* 46*  CREATININE 1.54* 1.66* 1.63* 1.53* 1.56*  CALCIUM 8.5* 9.1 8.8* 9.1 9.1   GFR Estimated Creatinine Clearance: 32.4 mL/min (A) (by C-G formula based on SCr of 1.56 mg/dL (H)). Liver Function Tests: Recent Labs  Lab 02/11/19 1744 02/12/19 1715 02/13/19 0800 02/14/19 0520 02/15/19 0010  AST 19 15 16 16 15   ALT 29 23 22 20 19   ALKPHOS 52 46 43 46 49  BILITOT 0.7 0.8 0.7 0.4 0.5  PROT 6.3* 6.5 6.3* 5.9* 6.0*  ALBUMIN  3.3* 3.3* 3.3* 2.9* 3.3*   No results for input(s): LIPASE, AMYLASE in the last 168 hours. No results for input(s): AMMONIA in the last 168 hours. Coagulation profile No results for input(s): INR, PROTIME in the last 168 hours. COVID-19 Labs  Recent Labs    02/12/19 1715 02/13/19 0800 02/14/19 0520 02/15/19 0010  DDIMER  --   --  0.38 0.31  FERRITIN 40 47  --   --   CRP 2.2* 1.5* 1.1* 1.6*    Lab Results  Component Value Date   SARSCOV2NAA POSITIVE (A) 02/11/2019   [place] NEGATIVE 02/04/2019    CBC: Recent Labs  Lab 02/11/19 1744 02/12/19 0623 02/12/19 1715 02/14/19 0520 02/15/19 0010  WBC 8.4 5.9 9.8 9.5 10.9*  NEUTROABS 7.1  --  8.2* 8.4* 9.6*  HGB 11.6* 11.7* 11.8* 12.0* 12.5*  HCT 38.1* 36.0* 37.5* 37.8*  38.7*  MCV 96.9 90.7 94.2 94.3 93.0  PLT 181 177 184 203 236   Cardiac Enzymes: No results for input(s): CKTOTAL, CKMB, CKMBINDEX, TROPONINI in the last 168 hours. BNP (last 3 results) No results for input(s): PROBNP in the last 8760 hours. CBG: Recent Labs  Lab 02/13/19 0811 02/14/19 0801 02/14/19 1213 02/14/19 2014 02/14/19 2048  GLUCAP 97 147* 200* 227* 217*   D-Dimer: Recent Labs    02/14/19 0520 02/15/19 0010  DDIMER 0.38 0.31   Hgb A1c: Recent Labs    02/14/19 0800  HGBA1C 6.9*   Lipid Profile: No results for input(s): CHOL, HDL, LDLCALC, TRIG, CHOLHDL, LDLDIRECT in the last 72 hours. Thyroid function studies: No results for input(s): TSH, T4TOTAL, T3FREE, THYROIDAB in the last 72 hours.  Invalid input(s): FREET3 Anemia work up: Recent Labs    02/12/19 1715 02/13/19 0800  FERRITIN 40 47   Sepsis Labs: Recent Labs  Lab 02/12/19 0623 02/12/19 1715 02/14/19 0520 02/15/19 0010  PROCALCITON  --  <0.10  --   --   WBC 5.9 9.8 9.5 10.9*   Microbiology Recent Results (from the past 240 hour(s))  SARS CORONAVIRUS 2 (TAT 6-24 HRS) Nasopharyngeal Nasopharyngeal Swab     Status: Abnormal   Collection Time: 02/11/19 11:07  PM   Specimen: Nasopharyngeal Swab  Result Value Ref Range Status   SARS Coronavirus 2 POSITIVE (A) NEGATIVE Final    Comment: RESULT CALLED TO, READ BACK BY AND VERIFIED WITH: Karma Greaser RN 14:55 02/12/19 (wilsonm) (NOTE) SARS-CoV-2 target nucleic acids are DETECTED. The SARS-CoV-2 RNA is generally detectable in upper and lower respiratory specimens during the acute phase of infection. Positive results are indicative of active infection with SARS-CoV-2. Clinical  correlation with patient history and other diagnostic information is necessary to determine patient infection status. Positive results do  not rule out bacterial infection or co-infection with other viruses. The expected result is Negative. Fact Sheet for Patients: SugarRoll.be Fact Sheet for Healthcare Providers: https://www.woods-mathews.com/ This test is not yet approved or cleared by the [place] FDA and  has been authorized for detection and/or diagnosis of SARS-CoV-2 by FDA under an Emergency Use Authorization (EUA). This EUA will remain  in effect (meaning this test can be used) for  the duration of the COVID-19 declaration under Section 564(b)(1) of the Act, 21 U.S.C. section 360bbb-3(b)(1), unless the authorization is terminated or revoked sooner. Performed at [facility], [place] [address] [place], [place] [address]      Medications:   . apixaban  2.5 mg Oral BID  . dexamethasone  6 mg Intravenous Q12H  . gabapentin  300 mg Oral BID  . insulin aspart  0-5 Units Subcutaneous QHS  . insulin aspart  0-6 Units Subcutaneous TID WC  . insulin aspart  2 Units Subcutaneous TID WC  . insulin detemir  5 Units Subcutaneous Daily  . mouth rinse  15 mL Mouth Rinse BID  . mometasone-formoterol  2 puff Inhalation BID  . pantoprazole  40 mg Oral Daily  . propranolol  20 mg Oral TID  . sertraline  50 mg Oral Daily  . simvastatin  20 mg Oral QPM  . sodium chloride flush   3 mL Intravenous Q12H  . umeclidinium bromide  1 puff Inhalation Daily  . ascorbic acid  500 mg Oral Daily  . zinc sulfate  220 mg Oral Daily   Continuous Infusions: . sodium chloride    . remdesivir 100 mg in NS 250 mL  100 mg (02/14/19 1120)     LOS: 2 days   Charlynne Cousins  [facility]  02/15/2019, 8:00 AM

## 2019-02-16 LAB — CBC WITH DIFFERENTIAL/PLATELET
Abs Immature Granulocytes: 0.1 10*3/uL — ABNORMAL HIGH (ref 0.00–0.07)
Basophils Absolute: 0 10*3/uL (ref 0.0–0.1)
Basophils Relative: 0 %
Eosinophils Absolute: 0 10*3/uL (ref 0.0–0.5)
Eosinophils Relative: 0 %
HCT: 43.8 % (ref 39.0–52.0)
Hemoglobin: 13.7 g/dL (ref 13.0–17.0)
Immature Granulocytes: 1 %
Lymphocytes Relative: 7 %
Lymphs Abs: 0.7 10*3/uL (ref 0.7–4.0)
MCH: 29.3 pg (ref 26.0–34.0)
MCHC: 31.3 g/dL (ref 30.0–36.0)
MCV: 93.8 fL (ref 80.0–100.0)
Monocytes Absolute: 0.5 10*3/uL (ref 0.1–1.0)
Monocytes Relative: 5 %
Neutro Abs: 8.6 10*3/uL — ABNORMAL HIGH (ref 1.7–7.7)
Neutrophils Relative %: 87 %
Platelets: 223 10*3/uL (ref 150–400)
RBC: 4.67 MIL/uL (ref 4.22–5.81)
RDW: 13.7 % (ref 11.5–15.5)
WBC: 10 10*3/uL (ref 4.0–10.5)
nRBC: 0 % (ref 0.0–0.2)

## 2019-02-16 LAB — COMPREHENSIVE METABOLIC PANEL
ALT: 18 U/L (ref 0–44)
AST: 18 U/L (ref 15–41)
Albumin: 3.3 g/dL — ABNORMAL LOW (ref 3.5–5.0)
Alkaline Phosphatase: 48 U/L (ref 38–126)
Anion gap: 10 (ref 5–15)
BUN: 47 mg/dL — ABNORMAL HIGH (ref 8–23)
CO2: 27 mmol/L (ref 22–32)
Calcium: 8.9 mg/dL (ref 8.9–10.3)
Chloride: 100 mmol/L (ref 98–111)
Creatinine, Ser: 1.65 mg/dL — ABNORMAL HIGH (ref 0.61–1.24)
GFR calc Af Amer: 44 mL/min — ABNORMAL LOW (ref >60)
GFR calc non Af Amer: 38 mL/min — ABNORMAL LOW (ref >60)
Glucose, Bld: 174 mg/dL — ABNORMAL HIGH (ref 70–99)
Potassium: 4.5 mmol/L (ref 3.5–5.1)
Sodium: 137 mmol/L (ref 135–145)
Total Bilirubin: 0.7 mg/dL (ref 0.3–1.2)
Total Protein: 6.1 g/dL — ABNORMAL LOW (ref 6.5–8.1)

## 2019-02-16 LAB — C-REACTIVE PROTEIN: CRP: 1 mg/dL — ABNORMAL HIGH (ref <1.0)

## 2019-02-16 LAB — D-DIMER, QUANTITATIVE: D-Dimer, Quant: 0.28 ug/mL-FEU (ref 0.00–0.50)

## 2019-02-16 LAB — GLUCOSE, CAPILLARY
Glucose-Capillary: 128 mg/dL — ABNORMAL HIGH (ref 70–99)
Glucose-Capillary: 183 mg/dL — ABNORMAL HIGH (ref 70–99)
Glucose-Capillary: 338 mg/dL — ABNORMAL HIGH (ref 70–99)
Glucose-Capillary: 249 mg/dL — ABNORMAL HIGH (ref 70–99)

## 2019-02-16 NOTE — Progress Notes (Signed)
[facility] PROGRESS NOTE    Progress Note  Johnathan Hudson  [id number] DOB: 1934-02-17 DOA: 02/13/2019 PCP: Johnathan Harrier, MD     Brief Narrative:   Johnathan Hudson is an 85 y.o. male past medical history of COPD, diabetes mellitus, status post pacemaker placement, coronary disease stage III, discharged 5 days prior to admission after being treated for sepsis secondary to pneumonia at that time he was also found to be in A. fib with RVR, he was placed on propanolol which also help with essential tremors and Eliquis.  Complete his course of empiric antibiotics went to his PCP found him to be fatigued and was complaining of shortness of breath due to SARS-CoV-2 PCR which was positive on 02/10/2019.  He was referred to [facility].  Keep saturations greater than 94%, will start empirically on IV vancomycin Levaquin and steroids.  Assessment/Plan:   Acute Respiratory failure with hypoxia due to COVID-19 viral pneumonitis: currently saturating greater than 92%. Inflammatory markers continue to improve.  He will complete his course of IV remdesivir, steroids and vitamin C and zinc. Physical therapy has evaluated the patient and awaiting recommendations. His leukocytosis is improving.  Small bilateral pleural effusions: He had positive hepatojugular reflux on physical exam which is now resolved. He was given a single low-dose of IV Lasix. His 2D echo was not able to evaluate his diastolic function and his systolic function is preserved.  Type 2 diabetes mellitus with stage 3a chronic kidney disease, without long-term current use of insulin (HCC) Insulin plus sliding scale blood glucose fairly controlled. We will stop his steroids today.  Atrial fibrillation, chronic (HCC) Rate controlled propranolol. Continue Eliquis.  Anxiety and depression: Continue Xanax.  History of bradycardia: Status post pacemaker. Discontinue telemetry.  Chronic kidney disease stage  III: Creatinine to be at appears to be at baseline.  DVT prophylaxis: Eliquis Family Communication:none Disposition Plan/Barrier to D/C: home once he finishes remdesivir treatment Code Status:     Code Status Orders  (From admission, onward)         Start     Ordered   02/13/19 2337  Do not attempt resuscitation (DNR)  Continuous    Question Answer Comment  In the event of cardiac or respiratory ARREST Do not call a code blue   In the event of cardiac or respiratory ARREST Do not perform Intubation, CPR, defibrillation or ACLS   In the event of cardiac or respiratory ARREST Use medication by any route, position, wound care, and other measures to relive pain and suffering. May use oxygen, suction and manual treatment of airway obstruction as needed for comfort.      02/13/19 2336        Code Status History    Date Active Date Inactive Code Status Order ID Comments User Context   02/13/2019 0720 02/13/2019 2336 Full Code [id number]  Charlynne Cousins, MD Inpatient   02/12/2019 0350 02/13/2019 0547 DNR [id number]  Rise Patience, MD ED   02/04/2019 1153 02/07/2019 1905 DNR [id number]  Loletha Grayer, MD ED   02/05/2017 1246 02/05/2017 1708 Full Code [id number]  Royston Cowper, MD Inpatient   03/01/2015 1315 03/01/2015 1740 Full Code [id number]  Isaias Cowman, MD Inpatient   Advance Care Planning Activity        IV Access:    Peripheral IV   Procedures and diagnostic studies:   No results found.   Medical Consultants:    None.  Anti-Infectives:   IV remdesivir  Subjective:  [name] relates his shortness of breath is improved, he is in a happy mood today sitting up in bed with good posture.  Objective:    Vitals:   02/15/19 2231 02/16/19 0041 02/16/19 0045 02/16/19 0514  BP: 126/74  127/78 109/69  Pulse: 67 80 72 67  Resp:  20 20 (!) 21  Temp:  98.1 F (36.7 C)  98 F (36.7 C)  TempSrc:  Oral  Oral  SpO2:  91% 93% (!) 88%    Weight:      Height:       SpO2: (!) 88 % O2 Flow Rate (L/min): 2 L/min   Intake/Output Summary (Last 24 hours) at 02/16/2019 0755 Last data filed at 02/16/2019 0547 Gross per 24 hour  Intake 666 ml  Output 2000 ml  Net -1334 ml   Filed Weights   02/13/19 0600 02/14/19 0455 02/15/19 0250  Weight: 74.4 kg 73.7 kg 70.8 kg    Exam: General exam: In no acute distress. Respiratory system: Good air movement and diffuse crackles bilaterally. Cardiovascular system: S1 & S2 heard, RRR. No JVD. Gastrointestinal system: Abdomen is nondistended, soft and nontender.  Central nervous system: Alert and oriented. No focal neurological deficits. Extremities: No pedal edema. Skin: No rashes, lesions or ulcers Psychiatry: Judgement and insight appear normal. Mood & affect appropriate.   Data Reviewed:    Labs: Basic Metabolic Panel: Recent Labs  Lab 02/12/19 1715 02/13/19 0800 02/14/19 0520 02/15/19 0010 02/16/19 0400  NA 140 139 139 136 137  K 4.5 3.9 4.3 4.0 4.5  CL 105 104 105 101 100  CO2 25 26 24 23 27   GLUCOSE 132* 108* 175* 127* 174*  BUN 35* 37* 42* 46* 47*  CREATININE 1.66* 1.63* 1.53* 1.56* 1.65*  CALCIUM 9.1 8.8* 9.1 9.1 8.9   GFR Estimated Creatinine Clearance: 30.6 mL/min (A) (by C-G formula based on SCr of 1.65 mg/dL (H)). Liver Function Tests: Recent Labs  Lab 02/12/19 1715 02/13/19 0800 02/14/19 0520 02/15/19 0010 02/16/19 0400  AST 15 16 16 15 18   ALT 23 22 20 19 18   ALKPHOS 46 43 46 49 48  BILITOT 0.8 0.7 0.4 0.5 0.7  PROT 6.5 6.3* 5.9* 6.0* 6.1*  ALBUMIN 3.3* 3.3* 2.9* 3.3* 3.3*   No results for input(s): LIPASE, AMYLASE in the last 168 hours. No results for input(s): AMMONIA in the last 168 hours. Coagulation profile No results for input(s): INR, PROTIME in the last 168 hours. COVID-19 Labs  Recent Labs    02/13/19 0800 02/14/19 0520 02/15/19 0010 02/16/19 0400  DDIMER  --  0.38 0.31 0.28  FERRITIN 47  --   --   --   CRP 1.5* 1.1*  1.6* 1.0*    Lab Results  Component Value Date   SARSCOV2NAA POSITIVE (A) 02/11/2019   [place] NEGATIVE 02/04/2019    CBC: Recent Labs  Lab 02/11/19 1744 02/12/19 0623 02/12/19 1715 02/14/19 0520 02/15/19 0010 02/16/19 0400  WBC 8.4 5.9 9.8 9.5 10.9* 10.0  NEUTROABS 7.1  --  8.2* 8.4* 9.6* 8.6*  HGB 11.6* 11.7* 11.8* 12.0* 12.5* 13.7  HCT 38.1* 36.0* 37.5* 37.8* 38.7* 43.8  MCV 96.9 90.7 94.2 94.3 93.0 93.8  PLT 181 177 184 203 236 223   Cardiac Enzymes: No results for input(s): CKTOTAL, CKMB, CKMBINDEX, TROPONINI in the last 168 hours. BNP (last 3 results) No results for input(s): PROBNP in the last 8760 hours. CBG: Recent Labs  Lab 02/14/19 2048 02/15/19 0831 02/15/19 1141 02/15/19  1642 02/15/19 2047  GLUCAP 217* 152* 269* 107* 241*   D-Dimer: Recent Labs    02/15/19 0010 02/16/19 0400  DDIMER 0.31 0.28   Hgb A1c: Recent Labs    02/14/19 0800  HGBA1C 6.9*   Lipid Profile: No results for input(s): CHOL, HDL, LDLCALC, TRIG, CHOLHDL, LDLDIRECT in the last 72 hours. Thyroid function studies: No results for input(s): TSH, T4TOTAL, T3FREE, THYROIDAB in the last 72 hours.  Invalid input(s): FREET3 Anemia work up: Recent Labs    02/13/19 0800  FERRITIN 47   Sepsis Labs: Recent Labs  Lab 02/12/19 1715 02/14/19 0520 02/15/19 0010 02/16/19 0400  PROCALCITON <0.10  --   --   --   WBC 9.8 9.5 10.9* 10.0   Microbiology Recent Results (from the past 240 hour(s))  SARS CORONAVIRUS 2 (TAT 6-24 HRS) Nasopharyngeal Nasopharyngeal Swab     Status: Abnormal   Collection Time: 02/11/19 11:07 PM   Specimen: Nasopharyngeal Swab  Result Value Ref Range Status   SARS Coronavirus 2 POSITIVE (A) NEGATIVE Final    Comment: RESULT CALLED TO, READ BACK BY AND VERIFIED WITH: Karma Greaser RN 14:55 02/12/19 ([email or web address]) (NOTE) SARS-CoV-2 target nucleic acids are DETECTED. The SARS-CoV-2 RNA is generally detectable in upper and lower respiratory specimens during the  acute phase of infection. Positive results are indicative of active infection with SARS-CoV-2. Clinical  correlation with patient history and other diagnostic information is necessary to determine patient infection status. Positive results do  not rule out bacterial infection or co-infection with other viruses. The expected result is Negative. Fact Sheet for Patients: SugarRoll.be Fact Sheet for Healthcare Providers: https://www.woods-mathews.com/ This test is not yet approved or cleared by the [place] FDA and  has been authorized for detection and/or diagnosis of SARS-CoV-2 by FDA under an Emergency Use Authorization (EUA). This EUA will remain  in effect (meaning this test can be used) for  the duration of the COVID-19 declaration under Section 564(b)(1) of the Act, 21 U.S.C. section 360bbb-3(b)(1), unless the authorization is terminated or revoked sooner. Performed at [facility], [place] [address] [place], [place] [address]      Medications:    apixaban  2.5 mg Oral BID   dexamethasone  6 mg Intravenous Q12H   gabapentin  300 mg Oral BID   insulin aspart  0-5 Units Subcutaneous QHS   insulin aspart  0-6 Units Subcutaneous TID WC   insulin aspart  2 Units Subcutaneous TID WC   insulin detemir  5 Units Subcutaneous Daily   mouth rinse  15 mL Mouth Rinse BID   mometasone-formoterol  2 puff Inhalation BID   pantoprazole  40 mg Oral Daily   propranolol  20 mg Oral TID   sertraline  50 mg Oral Daily   simvastatin  20 mg Oral QPM   sodium chloride flush  3 mL Intravenous Q12H   umeclidinium bromide  1 puff Inhalation Daily   ascorbic acid  500 mg Oral Daily   zinc sulfate  220 mg Oral Daily   Continuous Infusions:  sodium chloride     remdesivir 100 mg in NS 250 mL 100 mg (02/15/19 1035)     LOS: 3 days   Charlynne Cousins  [facility]  02/16/2019, 7:55 AM

## 2019-02-16 NOTE — Progress Notes (Signed)
patient transferred to 3rd floor room 319. Report given to [facility], Therapist, sports.

## 2019-02-16 NOTE — Progress Notes (Signed)
Physical Therapy Treatment Patient Details Name: Johnathan Hudson MRN: [id number] DOB: Apr 12, 1933 Today's Date: 02/16/2019    History of Present Illness Pt is an 85 y.o. male recently hospitalized 11/11-11/14 w/ sepsis and PNA, now re-admitted 02/11/19 w/ COPD exacerbation and PNA. Pt tested (+) for COVID-19 on 11/17. PMH includes OA, lumbar stenosis, HTN, HLD, episodic a-fib, dysrhythmia, DM, COPD, CKD, cardiomyopathy, benign essential tremor, pacemaker, ORIF R ankle.   PT Comments    Pt progressing well with mobility. Ambulating well without overt instability, SpO2 93-96% on RA. Pt hopeful for d/c home tomorrow. Daughter plans to stay with him a few days to provide assist as needed. Pt able to demonstrate correct technique with IS and flutter valve; educ on energy conservation strategies (handout provided) and aerobic activity recommendations.     Follow Up Recommendations  No PT follow up;Supervision - Intermittent     Equipment Recommendations  None recommended by PT    Recommendations for Other Services       Precautions / Restrictions Precautions Precautions: Fall Precaution Comments: tremor at rest and also with intention Restrictions Weight Bearing Restrictions: No    Mobility  Bed Mobility Overal bed mobility: Independent                Transfers Overall transfer level: Independent Equipment used: None Transfers: Sit to/from Stand              Ambulation/Gait Ambulation/Gait assistance: Scientist, forensic (Feet): 400 Feet Assistive device: None Gait Pattern/deviations: Step-through pattern;Decreased stride length   Gait velocity interpretation: 1.31 - 2.62 ft/sec, indicative of limited community ambulator General Gait Details: Slow, steady gait; no overt instability or LOB with higher level balance tasks. SpO2 93-96% on RA   Stairs             Wheelchair Mobility    Modified Rankin (Stroke Patients Only)       Balance  Overall balance assessment: Needs assistance   Sitting balance-Leahy Scale: Good       Standing balance-Leahy Scale: Good               High level balance activites: Side stepping;Backward walking;Direction changes;Turns;Head turns High Level Balance Comments: No overt instability or LOB with higher level balance tasks            Cognition Arousal/Alertness: Awake/alert Behavior During Therapy: WFL for tasks assessed/performed Overall Cognitive Status: Within Functional Limits for tasks assessed                                        Exercises      General Comments General comments (skin integrity, edema, etc.): Increased time discussing energy conservation strategies (handout provided) and aerobic activity recommendations      Pertinent Vitals/Pain Pain Assessment: No/denies pain    Home Living                      Prior Function            PT Goals (current goals can now be found in the care plan section) Progress towards PT goals: Progressing toward goals    Frequency    Min 3X/week      PT Plan Discharge plan needs to be updated    Co-evaluation              AM-PAC PT "6 Clicks" Mobility   Outcome Measure  Help needed turning from your back to your side while in a flat bed without using bedrails?: None Help needed moving from lying on your back to sitting on the side of a flat bed without using bedrails?: None Help needed moving to and from a bed to a chair (including a wheelchair)?: None Help needed standing up from a chair using your arms (e.g., wheelchair or bedside chair)?: None Help needed to walk in hospital room?: None Help needed climbing 3-5 steps with a railing? : A Little 6 Click Score: 23    End of Session     Patient left: in chair;with call bell/phone within reach Nurse Communication: Mobility status PT Visit Diagnosis: Other abnormalities of gait and mobility (R26.89);Muscle weakness  (generalized) (M62.81)     Time: 1416-1440 PT Time Calculation (min) (ACUTE ONLY): 24 min  Charges:  $Therapeutic Exercise: 8-22 mins $Self Care/Home Management: 8-22                    Mabeline Caras, PT, DPT Acute Rehabilitation Services  Pager [phone] Office [place] 02/16/2019, 5:05 PM

## 2019-02-17 LAB — CBC WITH DIFFERENTIAL/PLATELET
Abs Immature Granulocytes: 0.18 10*3/uL — ABNORMAL HIGH (ref 0.00–0.07)
Basophils Absolute: 0 10*3/uL (ref 0.0–0.1)
Basophils Relative: 0 %
Eosinophils Absolute: 0 10*3/uL (ref 0.0–0.5)
Eosinophils Relative: 0 %
HCT: 42.4 % (ref 39.0–52.0)
Hemoglobin: 13.3 g/dL (ref 13.0–17.0)
Immature Granulocytes: 2 %
Lymphocytes Relative: 6 %
Lymphs Abs: 0.6 10*3/uL — ABNORMAL LOW (ref 0.7–4.0)
MCH: 29.4 pg (ref 26.0–34.0)
MCHC: 31.4 g/dL (ref 30.0–36.0)
MCV: 93.6 fL (ref 80.0–100.0)
Monocytes Absolute: 0.4 10*3/uL (ref 0.1–1.0)
Monocytes Relative: 3 %
Neutro Abs: 10.1 10*3/uL — ABNORMAL HIGH (ref 1.7–7.7)
Neutrophils Relative %: 89 %
Platelets: 214 10*3/uL (ref 150–400)
RBC: 4.53 MIL/uL (ref 4.22–5.81)
RDW: 13.7 % (ref 11.5–15.5)
WBC: 11.3 10*3/uL — ABNORMAL HIGH (ref 4.0–10.5)
nRBC: 0 % (ref 0.0–0.2)

## 2019-02-17 LAB — GLUCOSE, CAPILLARY
Glucose-Capillary: 190 mg/dL — ABNORMAL HIGH (ref 70–99)
Glucose-Capillary: 118 mg/dL — ABNORMAL HIGH (ref 70–99)
Glucose-Capillary: 167 mg/dL — ABNORMAL HIGH (ref 70–99)
Glucose-Capillary: 264 mg/dL — ABNORMAL HIGH (ref 70–99)

## 2019-02-17 LAB — COMPREHENSIVE METABOLIC PANEL
ALT: 22 U/L (ref 0–44)
AST: 18 U/L (ref 15–41)
Albumin: 3 g/dL — ABNORMAL LOW (ref 3.5–5.0)
Alkaline Phosphatase: 45 U/L (ref 38–126)
Anion gap: 11 (ref 5–15)
BUN: 43 mg/dL — ABNORMAL HIGH (ref 8–23)
CO2: 24 mmol/L (ref 22–32)
Calcium: 8.8 mg/dL — ABNORMAL LOW (ref 8.9–10.3)
Chloride: 101 mmol/L (ref 98–111)
Creatinine, Ser: 1.41 mg/dL — ABNORMAL HIGH (ref 0.61–1.24)
GFR calc Af Amer: 53 mL/min — ABNORMAL LOW (ref >60)
GFR calc non Af Amer: 46 mL/min — ABNORMAL LOW (ref >60)
Glucose, Bld: 193 mg/dL — ABNORMAL HIGH (ref 70–99)
Potassium: 4.1 mmol/L (ref 3.5–5.1)
Sodium: 136 mmol/L (ref 135–145)
Total Bilirubin: 0.5 mg/dL (ref 0.3–1.2)
Total Protein: 5.7 g/dL — ABNORMAL LOW (ref 6.5–8.1)

## 2019-02-17 LAB — D-DIMER, QUANTITATIVE: D-Dimer, Quant: 0.29 ug/mL-FEU (ref 0.00–0.50)

## 2019-02-17 LAB — C-REACTIVE PROTEIN: CRP: 0.9 mg/dL (ref <1.0)

## 2019-02-17 NOTE — Consult Note (Signed)
   [facility] CM Inpatient Consult   02/17/2019  Johnathan Hudson [birth year] [id number]    Patient was screened for 7 day and 30 day readmissions and to check for potential needs of [facility] care managementservices for community follow-up as a benefit of his [facility] plan with 25% high risk score for unplanned readmission and hospitalizations.   Review of chart and MD notes reveal as follows: 85 y.o.malepast medical history of COPD, diabetes mellitus, status post pacemaker placement, coronary disease stage III,   discharged 5 days prior to admission after being treated for sepsis secondary to pneumonia at that time he was also found to be in A. fib with RVR, he was placed on propanolol which also help with essential tremors; and Eliquis. Completed his course of empiric antibiotics went to his PCP found him to be fatigued and was complaining of shortness of breath due to SARS-CoV-2 PCR which was positive on 02/10/2019. He was referred to [place] saturations greater than 94% and started empirically on IV vancomycin Levaquin and steroids.  Called and spoke with patient's daughters [name] and [place]) over the phone, after several attempts to contact patient.HIPAA information obtained. Patient's daughter [name]) reportsthat patient transitions to home atdischarge. He lives alone and other daughter Arrie Aran) is staying with him for few days. His daughter Arrie Aran) reports that patient has been managing his medications (using "pill box" filled weekly); pharmacy ([facility] and [facility] delivery service). Patient still drives, but his children provide transportation if needed. Patient's children support and assist him with his needs and alternately checking on him.   Discussed with patient's daughter regarding [facility] CM serviceswhich areavailable to helpmaintain health, managehealth needs andtoprevent readmissions. His daughteragreed with phone calls to follow-up health issues  and chronic medical conditions (Pneumonia, Covid-19, COPD, DM).  Patient's primary care provider is Dr[facility]Vishwanath Hande with [facility].  Verbal consentprovided for [facility]services.  Referralmadeto [facility] RNcaremanagement coordinator for complex care and disease managementneeds and to assess further needs postdischarge.   For questionsand additional information,please contact:  Mekaylah Klich A. Kiki Bivens, BSN, RN-BC [facility] Liaison Cell: [phone]

## 2019-02-17 NOTE — Discharge Summary (Signed)
Physician Discharge Summary  Johnathan Hudson [id number] DOB: 15-Mar-1934 DOA: 02/13/2019  PCP: Tracie Harrier, MD  Admit date: 02/13/2019 Discharge date: 02/17/2019  Admitted From: Home Disposition:  Home  Recommendations for Outpatient Follow-up:  1. Follow up with PCP in 1-2 weeks 2. Please obtain BMP/CBC in one week   Home Health:Yes Equipment/Devices:None  Discharge Condition:Stable CODE STATUS:Full Diet recommendation: Heart Healthy /  Brief/Interim Summary: 85 y.o. male past medical history of COPD, diabetes mellitus, status post pacemaker placement, coronary disease stage III, discharged 5 days prior to admission after being treated for sepsis secondary to pneumonia at that time he was also found to be in A. fib with RVR, he was placed on propanolol which also help with essential tremors and Eliquis.  Complete his course of empiric antibiotics went to his PCP found him to be fatigued and was complaining of shortness of breath due to SARS-CoV-2 PCR which was positive on 02/10/2019.  He was referred to [facility] regional.  Keep saturations greater than 94%, will start empirically on IV vancomycin Levaquin and steroids  Discharge Diagnoses:  Active Problems:   Type 2 diabetes mellitus with stage 3a chronic kidney disease, without long-term current use of insulin (HCC)   Atrial fibrillation, chronic (HCC)   Anxiety and depression   Acute respiratory failure with hypoxia (HCC)   Pneumonia due to COVID-19 virus   Respiratory tract infection due to COVID-19 virus   Bilateral pleural effusion   CKD (chronic kidney disease), stage III   Anemia of chronic disease Acute respiratory failure with hypoxia due to COVID-19 viral pneumonia: On admission he had to be placed on oxygen to keep saturation greater than 92%. His inflammatory markers were elevated but they started to trend to improve after starting IV steroids and IV remdesivir. Which you completed his course in house. As  the days went by he related his breathing was getting significantly better he was weaned to room air and able to ambulate without any difficulties and saturations remained stable with ambulation. Physical therapy evaluated the patient and recommended home health PT.  Small bilateral pleural effusion: Had a positive hepatojugular reflux on physical exam which resolved with a small dose of IV Lasix. 2D echo was done that showed a preserved systolic function they were not able to evaluate diastolic function he has no history of heart failure.  Diabetes mellitus type 2 with chronic kidney disease stage III without current use of insulin: As he was placed on steroids glucose started to trend up once his steroids were DC'd his blood glucose started to improve. His A1c was 6.9 will need to be followed by his PCP as an outpatient.  Chronic atrial fibrillation: He remained rate controlled with a chads vas score is greater than 2, continue Eliquis as an outpatient.  Anxiety and depression: Continue Xanax no changes made.  History of bradycardia: Status post maker.  Chronic kidney disease stage III: Creatinine remained at baseline.    Discharge Instructions  Discharge Instructions    Diet - low sodium heart healthy   Complete by: As directed    Increase activity slowly   Complete by: As directed    MyChart COVID-19 home monitoring program   Complete by: Feb 17, 2019    Is the patient willing to use the [place] for home monitoring?: No     Allergies as of 02/17/2019      Reactions   Sulfa Antibiotics Rash   Lipitor [atorvastatin] Other (See Comments)   Myalgia  Medication List    TAKE these medications   acetaminophen 500 MG tablet Commonly known as: TYLENOL Take 1,000 mg 3 (three) times daily as needed by mouth for moderate pain.   albuterol 108 (90 Base) MCG/ACT inhaler Commonly known as: VENTOLIN HFA Inhale 2 puffs into the lungs every 6 (six) hours as  needed for wheezing or shortness of breath.   ALPRAZolam 0.25 MG tablet Commonly known as: XANAX Take 0.25 mg by mouth at bedtime as needed for sleep.   ascorbic acid 500 MG tablet Commonly known as: VITAMIN C Take 1 tablet (500 mg total) by mouth daily.   budesonide-formoterol 80-4.5 MCG/ACT inhaler Commonly known as: Symbicort Inhale 2 puffs into the lungs 2 (two) times daily.   dexamethasone 10 MG/ML injection Commonly known as: DECADRON Inject 0.6 mLs (6 mg total) into the vein daily.   Eliquis 2.5 MG Tabs tablet Generic drug: apixaban Take 2.5 mg by mouth 2 (two) times daily.   gabapentin 300 MG capsule Commonly known as: NEURONTIN Take 300 mg by mouth 2 (two) times daily.   HYDROcodone-acetaminophen 7.5-325 MG tablet Commonly known as: NORCO Take 1 tablet by mouth 3 (three) times daily as needed for pain.   Ipratropium-Albuterol 20-100 MCG/ACT Aers respimat Commonly known as: COMBIVENT Inhale 1 puff into the lungs every 6 (six) hours.   LORazepam 0.5 MG tablet Commonly known as: ATIVAN Take 1 tablet (0.5 mg total) by mouth 2 (two) times daily.   methenamine 1 g tablet Commonly known as: HIPREX Take 1 g by mouth 2 (two) times daily with a meal.   omeprazole 20 MG capsule Commonly known as: PRILOSEC Take 20 mg by mouth 2 (two) times daily before a meal.   propranolol 20 MG tablet Commonly known as: INDERAL Take 1 tablet (20 mg total) by mouth 3 (three) times daily.   sertraline 50 MG tablet Commonly known as: ZOLOFT Take 50 mg by mouth daily.   simvastatin 20 MG tablet Commonly known as: ZOCOR Take 20 mg by mouth every evening.   Spiriva HandiHaler 18 MCG inhalation capsule Generic drug: tiotropium Place 1 capsule (18 mcg total) into inhaler and inhale daily.   zinc sulfate 220 (50 Zn) MG capsule Take 1 capsule (220 mg total) by mouth daily.       Allergies  Allergen Reactions  . Sulfa Antibiotics Rash  . Lipitor [Atorvastatin] Other (See  Comments)    Myalgia    Consultations:  None   Procedures/Studies: Dg Chest 2 View  Result Date: 02/11/2019 CLINICAL DATA:  Recent pneumonia, non improving EXAM: CHEST - 2 VIEW COMPARISON:  02/06/2019, 02/04/2019, CT 03/05/2018 FINDINGS: Small bilateral pleural effusions. Patchy airspace disease at the left base. Stable cardiomediastinal silhouette. Left-sided pacing device as before. No pneumothorax. IMPRESSION: 1. Small bilateral pleural effusions with persistent patchy airspace disease at the left base which may reflect residual pneumonia. 2. Borderline to mild cardiomegaly Electronically Signed   By: Donavan Foil M.D.   On: 02/11/2019 18:18   Dg Chest 2 View  Result Date: 02/06/2019 CLINICAL DATA:  Cough, fever and shortness of breath. EXAM: CHEST - 2 VIEW COMPARISON:  02/04/2019 FINDINGS: The patient has taken a better inspiration today. There is cardiomegaly. Pacemaker remains in place. The lungs appear better on today's study which could be due to the better inspiration or there could be improvement in mild edema or infiltrates. No worsening or new finding. Tiny effusions in the posterior costophrenic angles. IMPRESSION: Radiographic improvement since 2 days ago. Better  inspiration. The lungs appear essentially clear. Small effusions in the posterior costophrenic angles. Electronically Signed   By: Nelson Chimes M.D.   On: 02/06/2019 09:21   Dg Chest Port 1 View  Result Date: 02/04/2019 CLINICAL DATA:  Fever.  Shortness of breath. EXAM: PORTABLE CHEST 1 VIEW COMPARISON:  February 23, 2015 FINDINGS: The cardiomediastinal silhouette is stable. Stable pacemaker. The right lung is clear. There is mild opacity in the left mid lower lung. No other acute abnormalities. IMPRESSION: Mild opacity in left lung may represent infection or asymmetric edema. Recommend clinical correlation. Electronically Signed   By: Dorise Bullion III M.D   On: 02/04/2019 09:55     Subjective: No complaints  feels great.  Discharge Exam: Vitals:   02/16/19 1915 02/17/19 0325  BP: 113/80 117/72  Pulse: 72 78  Resp: 18 18  Temp: 97.6 F (36.4 C) 98 F (36.7 C)  SpO2: 96% 94%   Vitals:   02/16/19 1000 02/16/19 1800 02/16/19 1915 02/17/19 0325  BP: 120/69 126/72 113/80 117/72  Pulse: 66 70 72 78  Resp: (!) 22 18 18 18   Temp: 98.3 F (36.8 C) 98.4 F (36.9 C) 97.6 F (36.4 C) 98 F (36.7 C)  TempSrc: Oral Oral Oral Oral  SpO2: 95% 95% 96% 94%  Weight:      Height:        General: Pt is alert, awake, not in acute distress Cardiovascular: RRR, S1/S2 +, no rubs, no gallops Respiratory: CTA bilaterally, no wheezing, no rhonchi Abdominal: Soft, NT, ND, bowel sounds + Extremities: no edema, no cyanosis    The results of significant diagnostics from this hospitalization (including imaging, microbiology, ancillary and laboratory) are listed below for reference.     Microbiology: Recent Results (from the past 240 hour(s))  SARS CORONAVIRUS 2 (TAT 6-24 HRS) Nasopharyngeal Nasopharyngeal Swab     Status: Abnormal   Collection Time: 02/11/19 11:07 PM   Specimen: Nasopharyngeal Swab  Result Value Ref Range Status   SARS Coronavirus 2 POSITIVE (A) NEGATIVE Final    Comment: RESULT CALLED TO, READ BACK BY AND VERIFIED WITH: Karma Greaser RN 14:55 02/12/19 ([email or web address]) (NOTE) SARS-CoV-2 target nucleic acids are DETECTED. The SARS-CoV-2 RNA is generally detectable in upper and lower respiratory specimens during the acute phase of infection. Positive results are indicative of active infection with SARS-CoV-2. Clinical  correlation with patient history and other diagnostic information is necessary to determine patient infection status. Positive results do  not rule out bacterial infection or co-infection with other viruses. The expected result is Negative. Fact Sheet for Patients: SugarRoll.be Fact Sheet for Healthcare  Providers: https://www.woods-mathews.com/ This test is not yet approved or cleared by the [place] FDA and  has been authorized for detection and/or diagnosis of SARS-CoV-2 by FDA under an Emergency Use Authorization (EUA). This EUA will remain  in effect (meaning this test can be used) for  the duration of the COVID-19 declaration under Section 564(b)(1) of the Act, 21 U.S.C. section 360bbb-3(b)(1), unless the authorization is terminated or revoked sooner. Performed at [facility], [place] [address] [place], [place] [address]      Labs: BNP (last 3 results) Recent Labs    02/12/19 0623 02/12/19 1715  BNP 895.0* 123XX123*   Basic Metabolic Panel: Recent Labs  Lab 02/13/19 0800 02/14/19 0520 02/15/19 0010 02/16/19 0400 02/17/19 0550  NA 139 139 136 137 136  K 3.9 4.3 4.0 4.5 4.1  CL 104 105 101 100 101  CO2 26  24 23 27 24   GLUCOSE 108* 175* 127* 174* 193*  BUN 37* 42* 46* 47* 43*  CREATININE 1.63* 1.53* 1.56* 1.65* 1.41*  CALCIUM 8.8* 9.1 9.1 8.9 8.8*   Liver Function Tests: Recent Labs  Lab 02/13/19 0800 02/14/19 0520 02/15/19 0010 02/16/19 0400 02/17/19 0550  AST 16 16 15 18 18   ALT 22 20 19 18 22   ALKPHOS 43 46 49 48 45  BILITOT 0.7 0.4 0.5 0.7 0.5  PROT 6.3* 5.9* 6.0* 6.1* 5.7*  ALBUMIN 3.3* 2.9* 3.3* 3.3* 3.0*   No results for input(s): LIPASE, AMYLASE in the last 168 hours. No results for input(s): AMMONIA in the last 168 hours. CBC: Recent Labs  Lab 02/12/19 1715 02/14/19 0520 02/15/19 0010 02/16/19 0400 02/17/19 0550  WBC 9.8 9.5 10.9* 10.0 11.3*  NEUTROABS 8.2* 8.4* 9.6* 8.6* 10.1*  HGB 11.8* 12.0* 12.5* 13.7 13.3  HCT 37.5* 37.8* 38.7* 43.8 42.4  MCV 94.2 94.3 93.0 93.8 93.6  PLT 184 203 236 223 214   Cardiac Enzymes: No results for input(s): CKTOTAL, CKMB, CKMBINDEX, TROPONINI in the last 168 hours. BNP: Invalid input(s): POCBNP CBG: Recent Labs  Lab 02/15/19 1642 02/15/19 2047 02/16/19 0927 02/16/19 1310  02/16/19 1552  GLUCAP 107* 241* 183* 338* 249*   D-Dimer Recent Labs    02/16/19 0400 02/17/19 0550  DDIMER 0.28 0.29   Hgb A1c No results for input(s): HGBA1C in the last 72 hours. Lipid Profile No results for input(s): CHOL, HDL, LDLCALC, TRIG, CHOLHDL, LDLDIRECT in the last 72 hours. Thyroid function studies No results for input(s): TSH, T4TOTAL, T3FREE, THYROIDAB in the last 72 hours.  Invalid input(s): FREET3 Anemia work up No results for input(s): VITAMINB12, FOLATE, FERRITIN, TIBC, IRON, RETICCTPCT in the last 72 hours. Urinalysis    Component Value Date/Time   COLORURINE YELLOW (A) 02/04/2019 0946   APPEARANCEUR CLEAR (A) 02/04/2019 0946   LABSPEC 1.014 02/04/2019 0946   PHURINE 5.0 02/04/2019 0946   GLUCOSEU NEGATIVE 02/04/2019 0946   HGBUR NEGATIVE 02/04/2019 0946   BILIRUBINUR NEGATIVE 02/04/2019 0946   KETONESUR NEGATIVE 02/04/2019 0946   PROTEINUR NEGATIVE 02/04/2019 0946   NITRITE NEGATIVE 02/04/2019 0946   LEUKOCYTESUR NEGATIVE 02/04/2019 0946   Sepsis Labs Invalid input(s): PROCALCITONIN,  WBC,  LACTICIDVEN Microbiology Recent Results (from the past 240 hour(s))  SARS CORONAVIRUS 2 (TAT 6-24 HRS) Nasopharyngeal Nasopharyngeal Swab     Status: Abnormal   Collection Time: 02/11/19 11:07 PM   Specimen: Nasopharyngeal Swab  Result Value Ref Range Status   SARS Coronavirus 2 POSITIVE (A) NEGATIVE Final    Comment: RESULT CALLED TO, READ BACK BY AND VERIFIED WITH: Karma Greaser RN 14:55 02/12/19 ([email or web address]) (NOTE) SARS-CoV-2 target nucleic acids are DETECTED. The SARS-CoV-2 RNA is generally detectable in upper and lower respiratory specimens during the acute phase of infection. Positive results are indicative of active infection with SARS-CoV-2. Clinical  correlation with patient history and other diagnostic information is necessary to determine patient infection status. Positive results do  not rule out bacterial infection or co-infection with other  viruses. The expected result is Negative. Fact Sheet for Patients: SugarRoll.be Fact Sheet for Healthcare Providers: https://www.woods-mathews.com/ This test is not yet approved or cleared by the [place] FDA and  has been authorized for detection and/or diagnosis of SARS-CoV-2 by FDA under an Emergency Use Authorization (EUA). This EUA will remain  in effect (meaning this test can be used) for  the duration of the COVID-19 declaration under Section 564(b)(1) of the Act, 21  U.S.C. section 360bbb-3(b)(1), unless the authorization is terminated or revoked sooner. Performed at [facility], [place] [address], [place], [place] [address]      Time coordinating discharge: Over 40 minutes  SIGNED:   Charlynne Cousins, MD  [facility] 02/17/2019, 8:16 AM Pager   If 7PM-7AM, please contact night-coverage [email or web address] Password [email or web address]

## 2019-02-18 ENCOUNTER — Other Ambulatory Visit: Payer: Self-pay

## 2019-02-18 NOTE — Patient Outreach (Signed)
[facility]) Care Management  02/18/2019  MARTELL LINDELL 12/03/1933 [id number]     Transition of Care Referral  Referral Date: 02/18/2019 Referral Source: [facility] Liaison Date of Admission: 02/13/2019 Diagnosis: COVID-19 positive Date of Discharge: 02/17/2019 Facility: [place]: [facility] Rossetti Medicare *PCP Office Does [facility]*    Outreach attempt # 1 to patient. No answer at present. RN CM unable to leave message as voicemail not set up.    Plan: RN CM will make outreach attempt to patient within 3-4 business days. RN CM will send unsuccessful outreach letter to patient.   Enzo Montgomery, RN,BSN,CCM [place] [facility] Telephonic Care Management Coordinator Direct Phone: [phone] Toll Free: [phone] Fax: [phone]

## 2019-02-23 ENCOUNTER — Other Ambulatory Visit: Payer: Self-pay

## 2019-02-23 DIAGNOSIS — E1122 Type 2 diabetes mellitus with diabetic chronic kidney disease: Secondary | ICD-10-CM | POA: Diagnosis not present

## 2019-02-23 DIAGNOSIS — R251 Tremor, unspecified: Secondary | ICD-10-CM | POA: Diagnosis not present

## 2019-02-23 DIAGNOSIS — I129 Hypertensive chronic kidney disease with stage 1 through stage 4 chronic kidney disease, or unspecified chronic kidney disease: Secondary | ICD-10-CM | POA: Diagnosis not present

## 2019-02-23 DIAGNOSIS — D649 Anemia, unspecified: Secondary | ICD-10-CM | POA: Diagnosis not present

## 2019-02-23 DIAGNOSIS — N183 Chronic kidney disease, stage 3 unspecified: Secondary | ICD-10-CM | POA: Diagnosis not present

## 2019-02-23 DIAGNOSIS — F329 Major depressive disorder, single episode, unspecified: Secondary | ICD-10-CM | POA: Diagnosis not present

## 2019-02-23 DIAGNOSIS — I480 Paroxysmal atrial fibrillation: Secondary | ICD-10-CM | POA: Diagnosis not present

## 2019-02-23 DIAGNOSIS — Z09 Encounter for follow-up examination after completed treatment for conditions other than malignant neoplasm: Secondary | ICD-10-CM | POA: Diagnosis not present

## 2019-02-23 DIAGNOSIS — J449 Chronic obstructive pulmonary disease, unspecified: Secondary | ICD-10-CM | POA: Diagnosis not present

## 2019-02-23 NOTE — Patient Outreach (Addendum)
[facility]) Care Management  02/23/2019   MCADOO LIVERPOOL 07-02-1933 [id number]  Initial Assessment  Transition of Care Referral  Referral Date: 02/18/2019 Referral Source: [facility] Liaison Date of Admission: 02/13/2019 Diagnosis: COVID-19 positive Date of Discharge: 02/17/2019 Facility: [place]: [facility] Rossetti Medicare *PCP Office Does [facility]*   Outreach attempt #2 to patient. Spoke with patient. He denies any acute issues or concerns at present. He voices that he s doing fairly well. Patient reports that he is independent with ADLs/IADLs. He lives alone but his daughter is temporarily staying with him to help him out. However, patient reports that his daughter is currently sick and not feeling well today. Patient voices that he drives himself to MD appts.  Conditions: Per chart review, patient has PMH that includes but not limited to COPD, DM(A1C 6.9-Nov 2020), Pacer, CKD and Anemia. Patient was hospitalized from 02/13/2019-02/17/2019 for COVID-19 positive. He voices that he is recovering and doing well. He denies any SOB at present. He voices that breathing is managed/controlled at present.    Appointments: Patient has not made PCP follow up appt. He is aware that he needs to do so and voices he will do so this week.  Medications: Patient denies any issues or concerns regarding his meds. He denies an issues affording meds. His daughter is assisting him with filling med planner.   Current Medications:  Current Outpatient Medications  Medication Sig Dispense Refill  . acetaminophen (TYLENOL) 500 MG tablet Take 1,000 mg 3 (three) times daily as needed by mouth for moderate pain.     Marland Kitchen albuterol (VENTOLIN HFA) 108 (90 Base) MCG/ACT inhaler Inhale 2 puffs into the lungs every 6 (six) hours as needed for wheezing or shortness of breath. 18 g 0  . ALPRAZolam (XANAX) 0.25 MG tablet Take 0.25 mg by mouth at bedtime as needed for sleep.    Marland Kitchen apixaban  (ELIQUIS) 2.5 MG TABS tablet Take 2.5 mg by mouth 2 (two) times daily.    . budesonide-formoterol (SYMBICORT) 80-4.5 MCG/ACT inhaler Inhale 2 puffs into the lungs 2 (two) times daily. 1 Inhaler 0  . dexamethasone (DECADRON) 10 MG/ML injection Inject 0.6 mLs (6 mg total) into the vein daily. 1 mL 0  . gabapentin (NEURONTIN) 300 MG capsule Take 300 mg by mouth 2 (two) times daily.     Marland Kitchen HYDROcodone-acetaminophen (NORCO) 7.5-325 MG tablet Take 1 tablet by mouth 3 (three) times daily as needed for pain.    . Ipratropium-Albuterol (COMBIVENT) 20-100 MCG/ACT AERS respimat Inhale 1 puff into the lungs every 6 (six) hours.    Marland Kitchen LORazepam (ATIVAN) 0.5 MG tablet Take 1 tablet (0.5 mg total) by mouth 2 (two) times daily. 8 tablet 0  . methenamine (HIPREX) 1 g tablet Take 1 g by mouth 2 (two) times daily with a meal.    . omeprazole (PRILOSEC) 20 MG capsule Take 20 mg by mouth 2 (two) times daily before a meal.    . propranolol (INDERAL) 20 MG tablet Take 1 tablet (20 mg total) by mouth 3 (three) times daily. 90 tablet 0  . sertraline (ZOLOFT) 50 MG tablet Take 50 mg by mouth daily.    . simvastatin (ZOCOR) 20 MG tablet Take 20 mg by mouth every evening.     . tiotropium (SPIRIVA HANDIHALER) 18 MCG inhalation capsule Place 1 capsule (18 mcg total) into inhaler and inhale daily. 30 capsule 0  . vitamin C (VITAMIN C) 500 MG tablet Take 1 tablet (500 mg total)  by mouth daily.    Marland Kitchen zinc sulfate 220 (50 Zn) MG capsule Take 1 capsule (220 mg total) by mouth daily.     No current facility-administered medications for this visit.     Functional Status:  In your present state of health, do you have any difficulty performing the following activities: 02/23/2019 02/13/2019  Hearing? Tempie Donning  Comment [place]? Tempie Donning  Comment wears reading glasses -  Difficulty concentrating or making decisions? N N  Walking or climbing stairs? N Y  Dressing or bathing? N N  Doing errands, shopping? N N  Preparing Food and eating  ? N -  Using the Toilet? N -  In the past six months, have you accidently leaked urine? N -  Do you have problems with loss of bowel control? N -  Managing your Medications? Y -  Comment daughter fills pill box -  Managing your Finances? N -  Housekeeping or managing your Housekeeping? N -  Some recent data might be hidden    Fall/Depression Screening: Fall Risk  02/23/2019  Falls in the past year? 0  Number falls in past yr: 0  Injury with Fall? 0   PHQ 2/9 Scores 02/23/2019  PHQ - 2 Score 0    Assessment:  [facility] CM Care Plan Problem One     Most Recent Value  Care Plan Problem One  Patient at risk for readmission to the hosptial due to co-morbidities.  Role Documenting the Problem One  Care Management Telephonic [place] for Problem One  Active  [facility] Long Term Goal   Patient will have no hosptial readmissions within the next 31 days.  [facility] Long Term Goal Start Date  02/23/19  Interventions for Problem One Long Term Goal  RN CM assessed for any acute issues or concerns. RN CM reviewed COPD action plan with patient. RN CM cofnirmed pt has all his meds in the home. RN CM confirmed pt. has contact info for MDs.  [facility] CM Short Term Goal #1   Patient will complete all MD post dsicharge follow up appts within 30 days.  [facility] CM Short Term Goal #1 Start Date  02/23/19  Interventions for Short Term Goal #1  RN CM educated pt on importance of completing appts. RN CM assessed for any barriers to appts.  [facility] CM Short Term Goal #2   Patient will be able to verbalize at least 2-3 measures to manage breathing/SOB over the next 30 days.  [facility] CM Short Term Goal #2 Start Date  02/23/19  Interventions for Short Term Goal #2  RNCM reviewed and discussed sx mgmt measures. RN CM discussed with pt. steps to take for exacerbation of sxs. RN CM reviewed med mgmt.      Consent:  [facility] services reviewed and discussed with patient. Verbal consent for services given.  Plan:  RN CM discussed with  patient next outreach within a month. Patient gave verbal consent and in agreement with RN CM follow up timeframe. Patient aware that they may contact RN CM sooner for any issues or concerns. RN CM will send welcome letter to patient. RN CM will send barriers letter and route encounter to PCP.  Enzo Montgomery, RN,BSN,CCM [place] [facility] Telephonic Care Management Coordinator Direct Phone: [phone] Toll Free: [phone] Fax: [phone]

## 2019-02-24 ENCOUNTER — Ambulatory Visit: Payer: Medicare HMO

## 2019-02-24 DIAGNOSIS — J181 Lobar pneumonia, unspecified organism: Secondary | ICD-10-CM | POA: Diagnosis not present

## 2019-02-24 DIAGNOSIS — J449 Chronic obstructive pulmonary disease, unspecified: Secondary | ICD-10-CM | POA: Diagnosis not present

## 2019-02-24 DIAGNOSIS — F411 Generalized anxiety disorder: Secondary | ICD-10-CM | POA: Diagnosis not present

## 2019-02-24 DIAGNOSIS — R251 Tremor, unspecified: Secondary | ICD-10-CM | POA: Diagnosis not present

## 2019-02-24 DIAGNOSIS — Z7901 Long term (current) use of anticoagulants: Secondary | ICD-10-CM | POA: Diagnosis not present

## 2019-02-24 DIAGNOSIS — I9589 Other hypotension: Secondary | ICD-10-CM | POA: Diagnosis not present

## 2019-02-24 DIAGNOSIS — Z950 Presence of cardiac pacemaker: Secondary | ICD-10-CM | POA: Diagnosis not present

## 2019-02-24 DIAGNOSIS — E1121 Type 2 diabetes mellitus with diabetic nephropathy: Secondary | ICD-10-CM | POA: Diagnosis not present

## 2019-02-24 DIAGNOSIS — I129 Hypertensive chronic kidney disease with stage 1 through stage 4 chronic kidney disease, or unspecified chronic kidney disease: Secondary | ICD-10-CM | POA: Diagnosis not present

## 2019-02-24 DIAGNOSIS — E1122 Type 2 diabetes mellitus with diabetic chronic kidney disease: Secondary | ICD-10-CM | POA: Diagnosis not present

## 2019-02-24 DIAGNOSIS — U071 COVID-19: Secondary | ICD-10-CM | POA: Diagnosis not present

## 2019-02-24 DIAGNOSIS — N1832 Chronic kidney disease, stage 3b: Secondary | ICD-10-CM | POA: Diagnosis not present

## 2019-02-24 DIAGNOSIS — I480 Paroxysmal atrial fibrillation: Secondary | ICD-10-CM | POA: Diagnosis not present

## 2019-02-24 DIAGNOSIS — J1289 Other viral pneumonia: Secondary | ICD-10-CM | POA: Diagnosis not present

## 2019-02-24 DIAGNOSIS — Z09 Encounter for follow-up examination after completed treatment for conditions other than malignant neoplasm: Secondary | ICD-10-CM | POA: Diagnosis not present

## 2019-02-24 DIAGNOSIS — J9600 Acute respiratory failure, unspecified whether with hypoxia or hypercapnia: Secondary | ICD-10-CM | POA: Diagnosis not present

## 2019-02-26 ENCOUNTER — Emergency Department
Admission: EM | Admit: 2019-02-26 | Discharge: 2019-02-26 | Disposition: A | Discharge status: Other Acute Inpatient Hospital | Payer: Medicare HMO | Attending: Emergency Medicine | Admitting: Emergency Medicine

## 2019-02-26 ENCOUNTER — Other Ambulatory Visit: Payer: Self-pay

## 2019-02-26 ENCOUNTER — Emergency Department: Payer: Medicare HMO

## 2019-02-26 ENCOUNTER — Inpatient Hospital Stay (HOSPITAL_COMMUNITY)
Admission: AD | Admit: 2019-02-26 | Discharge: 2019-03-27 | DRG: 177 | Disposition: E | Payer: Medicare HMO | Attending: Family Medicine | Admitting: Family Medicine

## 2019-02-26 ENCOUNTER — Encounter: Payer: Self-pay | Admitting: Emergency Medicine

## 2019-02-26 DIAGNOSIS — M549 Dorsalgia, unspecified: Secondary | ICD-10-CM | POA: Diagnosis present

## 2019-02-26 DIAGNOSIS — Z515 Encounter for palliative care: Secondary | ICD-10-CM | POA: Diagnosis present

## 2019-02-26 DIAGNOSIS — D696 Thrombocytopenia, unspecified: Secondary | ICD-10-CM | POA: Diagnosis present

## 2019-02-26 DIAGNOSIS — E1122 Type 2 diabetes mellitus with diabetic chronic kidney disease: Secondary | ICD-10-CM | POA: Diagnosis not present

## 2019-02-26 DIAGNOSIS — N1831 Chronic kidney disease, stage 3a: Secondary | ICD-10-CM | POA: Diagnosis present

## 2019-02-26 DIAGNOSIS — J9601 Acute respiratory failure with hypoxia: Secondary | ICD-10-CM | POA: Diagnosis not present

## 2019-02-26 DIAGNOSIS — I482 Chronic atrial fibrillation, unspecified: Secondary | ICD-10-CM | POA: Diagnosis present

## 2019-02-26 DIAGNOSIS — U071 COVID-19: Secondary | ICD-10-CM | POA: Insufficient documentation

## 2019-02-26 DIAGNOSIS — M069 Rheumatoid arthritis, unspecified: Secondary | ICD-10-CM | POA: Diagnosis present

## 2019-02-26 DIAGNOSIS — Z809 Family history of malignant neoplasm, unspecified: Secondary | ICD-10-CM

## 2019-02-26 DIAGNOSIS — Z79899 Other long term (current) drug therapy: Secondary | ICD-10-CM | POA: Insufficient documentation

## 2019-02-26 DIAGNOSIS — N179 Acute kidney failure, unspecified: Secondary | ICD-10-CM | POA: Diagnosis present

## 2019-02-26 DIAGNOSIS — I21A1 Myocardial infarction type 2: Secondary | ICD-10-CM | POA: Diagnosis not present

## 2019-02-26 DIAGNOSIS — Z79891 Long term (current) use of opiate analgesic: Secondary | ICD-10-CM

## 2019-02-26 DIAGNOSIS — N1832 Chronic kidney disease, stage 3b: Secondary | ICD-10-CM | POA: Diagnosis present

## 2019-02-26 DIAGNOSIS — R0600 Dyspnea, unspecified: Secondary | ICD-10-CM | POA: Diagnosis present

## 2019-02-26 DIAGNOSIS — I129 Hypertensive chronic kidney disease with stage 1 through stage 4 chronic kidney disease, or unspecified chronic kidney disease: Secondary | ICD-10-CM | POA: Insufficient documentation

## 2019-02-26 DIAGNOSIS — J1289 Other viral pneumonia: Secondary | ICD-10-CM | POA: Diagnosis present

## 2019-02-26 DIAGNOSIS — G250 Essential tremor: Secondary | ICD-10-CM | POA: Diagnosis present

## 2019-02-26 DIAGNOSIS — F329 Major depressive disorder, single episode, unspecified: Secondary | ICD-10-CM | POA: Diagnosis present

## 2019-02-26 DIAGNOSIS — E1165 Type 2 diabetes mellitus with hyperglycemia: Secondary | ICD-10-CM | POA: Diagnosis not present

## 2019-02-26 DIAGNOSIS — R0902 Hypoxemia: Secondary | ICD-10-CM | POA: Diagnosis present

## 2019-02-26 DIAGNOSIS — R0602 Shortness of breath: Secondary | ICD-10-CM

## 2019-02-26 DIAGNOSIS — Z7901 Long term (current) use of anticoagulants: Secondary | ICD-10-CM | POA: Diagnosis not present

## 2019-02-26 DIAGNOSIS — F17220 Nicotine dependence, chewing tobacco, uncomplicated: Secondary | ICD-10-CM | POA: Diagnosis not present

## 2019-02-26 DIAGNOSIS — N189 Chronic kidney disease, unspecified: Secondary | ICD-10-CM

## 2019-02-26 DIAGNOSIS — J440 Chronic obstructive pulmonary disease with acute lower respiratory infection: Secondary | ICD-10-CM | POA: Diagnosis not present

## 2019-02-26 DIAGNOSIS — G8929 Other chronic pain: Secondary | ICD-10-CM | POA: Diagnosis present

## 2019-02-26 DIAGNOSIS — J449 Chronic obstructive pulmonary disease, unspecified: Secondary | ICD-10-CM | POA: Diagnosis present

## 2019-02-26 DIAGNOSIS — T380X5A Adverse effect of glucocorticoids and synthetic analogues, initial encounter: Secondary | ICD-10-CM | POA: Diagnosis not present

## 2019-02-26 DIAGNOSIS — J90 Pleural effusion, not elsewhere classified: Secondary | ICD-10-CM | POA: Diagnosis not present

## 2019-02-26 DIAGNOSIS — R6883 Chills (without fever): Secondary | ICD-10-CM | POA: Diagnosis not present

## 2019-02-26 DIAGNOSIS — E785 Hyperlipidemia, unspecified: Secondary | ICD-10-CM | POA: Diagnosis present

## 2019-02-26 DIAGNOSIS — Z950 Presence of cardiac pacemaker: Secondary | ICD-10-CM | POA: Insufficient documentation

## 2019-02-26 DIAGNOSIS — R0682 Tachypnea, not elsewhere classified: Secondary | ICD-10-CM | POA: Diagnosis present

## 2019-02-26 DIAGNOSIS — F411 Generalized anxiety disorder: Secondary | ICD-10-CM | POA: Diagnosis present

## 2019-02-26 DIAGNOSIS — R918 Other nonspecific abnormal finding of lung field: Secondary | ICD-10-CM | POA: Diagnosis not present

## 2019-02-26 DIAGNOSIS — Z7951 Long term (current) use of inhaled steroids: Secondary | ICD-10-CM

## 2019-02-26 DIAGNOSIS — Z87442 Personal history of urinary calculi: Secondary | ICD-10-CM

## 2019-02-26 DIAGNOSIS — I429 Cardiomyopathy, unspecified: Secondary | ICD-10-CM | POA: Diagnosis present

## 2019-02-26 DIAGNOSIS — J189 Pneumonia, unspecified organism: Secondary | ICD-10-CM | POA: Diagnosis not present

## 2019-02-26 DIAGNOSIS — Z820 Family history of epilepsy and other diseases of the nervous system: Secondary | ICD-10-CM

## 2019-02-26 DIAGNOSIS — J1282 Pneumonia due to coronavirus disease 2019: Secondary | ICD-10-CM

## 2019-02-26 DIAGNOSIS — E1121 Type 2 diabetes mellitus with diabetic nephropathy: Secondary | ICD-10-CM | POA: Diagnosis not present

## 2019-02-26 DIAGNOSIS — Z66 Do not resuscitate: Secondary | ICD-10-CM | POA: Diagnosis not present

## 2019-02-26 LAB — COMPREHENSIVE METABOLIC PANEL
ALT: 17 U/L (ref 0–44)
AST: 34 U/L (ref 15–41)
Albumin: 2.8 g/dL — ABNORMAL LOW (ref 3.5–5.0)
Alkaline Phosphatase: 53 U/L (ref 38–126)
Anion gap: 12 (ref 5–15)
BUN: 32 mg/dL — ABNORMAL HIGH (ref 8–23)
CO2: 20 mmol/L — ABNORMAL LOW (ref 22–32)
Calcium: 8.9 mg/dL (ref 8.9–10.3)
Chloride: 99 mmol/L (ref 98–111)
Creatinine, Ser: 1.68 mg/dL — ABNORMAL HIGH (ref 0.61–1.24)
GFR calc Af Amer: 43 mL/min — ABNORMAL LOW (ref >60)
GFR calc non Af Amer: 37 mL/min — ABNORMAL LOW (ref >60)
Glucose, Bld: 231 mg/dL — ABNORMAL HIGH (ref 70–99)
Potassium: 4 mmol/L (ref 3.5–5.1)
Sodium: 131 mmol/L — ABNORMAL LOW (ref 135–145)
Total Bilirubin: 0.6 mg/dL (ref 0.3–1.2)
Total Protein: 6.3 g/dL — ABNORMAL LOW (ref 6.5–8.1)

## 2019-02-26 LAB — RESPIRATORY PANEL BY PCR

## 2019-02-26 LAB — CBC
HCT: 41 % (ref 39.0–52.0)
Hemoglobin: 13 g/dL (ref 13.0–17.0)
MCH: 29.2 pg (ref 26.0–34.0)
MCHC: 31.7 g/dL (ref 30.0–36.0)
MCV: 92.1 fL (ref 80.0–100.0)
Platelets: 136 10*3/uL — ABNORMAL LOW (ref 150–400)
RBC: 4.45 MIL/uL (ref 4.22–5.81)
RDW: 13.5 % (ref 11.5–15.5)
WBC: 14.5 10*3/uL — ABNORMAL HIGH (ref 4.0–10.5)
nRBC: 0 % (ref 0.0–0.2)

## 2019-02-26 LAB — TROPONIN I (HIGH SENSITIVITY)
Troponin I (High Sensitivity): 39 ng/L — ABNORMAL HIGH (ref <18)
Troponin I (High Sensitivity): 42 ng/L — ABNORMAL HIGH (ref <18)

## 2019-02-26 LAB — BRAIN NATRIURETIC PEPTIDE: B Natriuretic Peptide: 352 pg/mL — ABNORMAL HIGH (ref 0.0–100.0)

## 2019-02-26 LAB — PROCALCITONIN: Procalcitonin: <0.1 ng/mL

## 2019-02-26 LAB — C-REACTIVE PROTEIN: CRP: 10.9 mg/dL — ABNORMAL HIGH (ref <1.0)

## 2019-02-26 LAB — FIBRIN DERIVATIVES D-DIMER (ARMC ONLY): Fibrin derivatives D-dimer (ARMC): 730.13 ng/mL (FEU) — ABNORMAL HIGH (ref 0.00–499.00)

## 2019-02-26 LAB — INFLUENZA PANEL BY PCR (TYPE A & B)
Influenza A By PCR: NEGATIVE
Influenza B By PCR: NEGATIVE

## 2019-02-26 LAB — GLUCOSE, CAPILLARY: Glucose-Capillary: 230 mg/dL — ABNORMAL HIGH (ref 70–99)

## 2019-02-26 LAB — D-DIMER, QUANTITATIVE: D-Dimer, Quant: 0.59 ug/mL-FEU — ABNORMAL HIGH (ref 0.00–0.50)

## 2019-02-26 MED ORDER — VANCOMYCIN HCL 10 G IV SOLR
1500.0000 mg | Freq: Once | INTRAVENOUS | Status: AC
  Administered 2019-02-26: 19:00:00 1500 mg via INTRAVENOUS
  Filled 2019-02-26: qty 1500

## 2019-02-26 MED ORDER — METHYLPREDNISOLONE SODIUM SUCC 40 MG IJ SOLR: 40.0000 mg | Freq: Two times a day (BID) | INTRAMUSCULAR | Status: DC

## 2019-02-26 MED ORDER — DEXAMETHASONE SODIUM PHOSPHATE 10 MG/ML IJ SOLN: 6.0000 mg | INTRAMUSCULAR | Status: DC

## 2019-02-26 MED ORDER — METHENAMINE HIPPURATE 1 G PO TABS: 1.0000 g | ORAL_TABLET | Freq: Two times a day (BID) | ORAL | Status: DC

## 2019-02-26 MED ORDER — GABAPENTIN 300 MG PO CAPS
300.0000 mg | ORAL_CAPSULE | Freq: Two times a day (BID) | ORAL | Status: DC
  Administered 2019-02-26 – 2019-03-02 (×8): 300 mg via ORAL
  Filled 2019-02-26 (×8): qty 1

## 2019-02-26 MED ORDER — TIOTROPIUM BROMIDE MONOHYDRATE 18 MCG IN CAPS: 18.0000 ug | ORAL_CAPSULE | Freq: Every day | RESPIRATORY_TRACT | Status: DC

## 2019-02-26 MED ORDER — METHENAMINE MANDELATE 1 G PO TABS
1000.0000 mg | ORAL_TABLET | Freq: Two times a day (BID) | ORAL | Status: DC
  Filled 2019-02-26: qty 1

## 2019-02-26 MED ORDER — METHYLPREDNISOLONE SODIUM SUCC 40 MG IJ SOLR
40.0000 mg | Freq: Two times a day (BID) | INTRAMUSCULAR | Status: DC
  Administered 2019-02-27 – 2019-03-02 (×7): 40 mg via INTRAVENOUS
  Filled 2019-02-26 (×7): qty 1

## 2019-02-26 MED ORDER — SERTRALINE HCL 50 MG PO TABS
50.0000 mg | ORAL_TABLET | Freq: Every day | ORAL | Status: DC
  Administered 2019-02-26 – 2019-03-02 (×5): 50 mg via ORAL
  Filled 2019-02-26 (×5): qty 1

## 2019-02-26 MED ORDER — ALBUTEROL SULFATE HFA 108 (90 BASE) MCG/ACT IN AERS
2.0000 | INHALATION_SPRAY | Freq: Four times a day (QID) | RESPIRATORY_TRACT | Status: DC | PRN
  Filled 2019-02-26: qty 6.7

## 2019-02-26 MED ORDER — LORAZEPAM 0.5 MG PO TABS
0.5000 mg | ORAL_TABLET | Freq: Two times a day (BID) | ORAL | Status: DC | PRN
  Administered 2019-02-26 – 2019-02-28 (×3): 0.5 mg via ORAL
  Filled 2019-02-26 (×3): qty 1

## 2019-02-26 MED ORDER — VANCOMYCIN HCL IN DEXTROSE 750-5 MG/150ML-% IV SOLN
750.0000 mg | INTRAVENOUS | Status: DC
  Administered 2019-02-27: 22:00:00 750 mg via INTRAVENOUS
  Filled 2019-02-26: qty 150

## 2019-02-26 MED ORDER — HYDROCODONE-ACETAMINOPHEN 7.5-325 MG PO TABS: 1.0000 | ORAL_TABLET | Freq: Three times a day (TID) | ORAL | Status: DC | PRN

## 2019-02-26 MED ORDER — SODIUM CHLORIDE 0.9 % IV SOLN: 2.0000 g | Freq: Two times a day (BID) | INTRAVENOUS | Status: DC

## 2019-02-26 MED ORDER — INSULIN DETEMIR 100 UNIT/ML ~~LOC~~ SOLN
5.0000 [IU] | Freq: Two times a day (BID) | SUBCUTANEOUS | Status: DC
  Administered 2019-02-26 – 2019-03-01 (×6): 5 [IU] via SUBCUTANEOUS
  Filled 2019-02-26 (×8): qty 0.05

## 2019-02-26 MED ORDER — SODIUM CHLORIDE 0.9 % IV SOLN
2.0000 g | INTRAVENOUS | Status: DC
  Administered 2019-02-26 – 2019-03-01 (×4): 2 g via INTRAVENOUS
  Filled 2019-02-26 (×5): qty 2

## 2019-02-26 MED ORDER — METHENAMINE MANDELATE 1 G PO TABS
1000.0000 mg | ORAL_TABLET | Freq: Two times a day (BID) | ORAL | Status: DC
  Administered 2019-02-27 – 2019-03-02 (×7): 1000 mg via ORAL
  Filled 2019-02-26 (×9): qty 1

## 2019-02-26 MED ORDER — ACETAMINOPHEN 325 MG PO TABS: 650.0000 mg | ORAL_TABLET | Freq: Four times a day (QID) | ORAL | Status: DC | PRN

## 2019-02-26 MED ORDER — INSULIN ASPART 100 UNIT/ML ~~LOC~~ SOLN: 0.0000 [IU] | Freq: Three times a day (TID) | SUBCUTANEOUS | Status: DC

## 2019-02-26 MED ORDER — HYDROCODONE-ACETAMINOPHEN 5-325 MG PO TABS
1.0000 | ORAL_TABLET | Freq: Once | ORAL | Status: AC
  Administered 2019-02-26: 13:00:00 1 via ORAL
  Filled 2019-02-26: qty 1

## 2019-02-26 MED ORDER — DEXAMETHASONE SODIUM PHOSPHATE 10 MG/ML IJ SOLN
10.0000 mg | Freq: Once | INTRAMUSCULAR | Status: AC
  Administered 2019-02-26: 10 mg via INTRAVENOUS
  Filled 2019-02-26: qty 1

## 2019-02-26 MED ORDER — POLYETHYLENE GLYCOL 3350 17 G PO PACK
17.0000 g | PACK | Freq: Two times a day (BID) | ORAL | Status: DC
  Administered 2019-02-26 – 2019-02-28 (×5): 17 g via ORAL
  Filled 2019-02-26 (×6): qty 1

## 2019-02-26 MED ORDER — UMECLIDINIUM BROMIDE 62.5 MCG/INH IN AEPB
1.0000 | INHALATION_SPRAY | Freq: Every day | RESPIRATORY_TRACT | Status: DC
  Administered 2019-02-27 – 2019-03-02 (×4): 1 via RESPIRATORY_TRACT
  Filled 2019-02-26: qty 7

## 2019-02-26 MED ORDER — PROPRANOLOL HCL 20 MG PO TABS: 20.0000 mg | ORAL_TABLET | Freq: Three times a day (TID) | ORAL | Status: DC

## 2019-02-26 MED ORDER — MOMETASONE FURO-FORMOTEROL FUM 100-5 MCG/ACT IN AERO
2.0000 | INHALATION_SPRAY | Freq: Two times a day (BID) | RESPIRATORY_TRACT | Status: DC
  Administered 2019-02-26 – 2019-03-03 (×10): 2 via RESPIRATORY_TRACT
  Filled 2019-02-26: qty 8.8

## 2019-02-26 MED ORDER — ACETAMINOPHEN 650 MG RE SUPP
650.0000 mg | Freq: Four times a day (QID) | RECTAL | Status: DC | PRN
  Filled 2019-02-26: qty 1

## 2019-02-26 MED ORDER — APIXABAN 2.5 MG PO TABS
2.5000 mg | ORAL_TABLET | Freq: Two times a day (BID) | ORAL | Status: DC
  Administered 2019-02-26 – 2019-03-02 (×8): 2.5 mg via ORAL
  Filled 2019-02-26 (×10): qty 1

## 2019-02-26 MED ORDER — INSULIN ASPART 100 UNIT/ML ~~LOC~~ SOLN
0.0000 [IU] | Freq: Every day | SUBCUTANEOUS | Status: DC
  Administered 2019-02-26: 22:00:00 2 [IU] via SUBCUTANEOUS
  Administered 2019-02-27: 22:00:00 4 [IU] via SUBCUTANEOUS

## 2019-02-26 MED ORDER — PANTOPRAZOLE SODIUM 40 MG PO TBEC
40.0000 mg | DELAYED_RELEASE_TABLET | Freq: Every day | ORAL | Status: DC
  Administered 2019-02-26 – 2019-03-02 (×5): 40 mg via ORAL
  Filled 2019-02-26 (×5): qty 1

## 2019-02-26 MED ORDER — ALPRAZOLAM 0.5 MG PO TABS
0.2500 mg | ORAL_TABLET | Freq: Every evening | ORAL | Status: DC | PRN
  Administered 2019-02-27 – 2019-03-01 (×3): 0.25 mg via ORAL
  Filled 2019-02-26 (×3): qty 1

## 2019-02-26 MED ORDER — SIMVASTATIN 20 MG PO TABS
20.0000 mg | ORAL_TABLET | Freq: Every evening | ORAL | Status: DC
  Administered 2019-02-26 – 2019-03-01 (×4): 20 mg via ORAL
  Filled 2019-02-26 (×4): qty 1

## 2019-02-26 MED ORDER — INSULIN ASPART 100 UNIT/ML ~~LOC~~ SOLN: 0.0000 [IU] | Freq: Every day | SUBCUTANEOUS | Status: DC

## 2019-02-26 MED ORDER — HYDROCODONE-ACETAMINOPHEN 5-325 MG PO TABS
1.0000 | ORAL_TABLET | Freq: Three times a day (TID) | ORAL | Status: DC | PRN
  Administered 2019-02-26 – 2019-02-28 (×6): 1 via ORAL
  Filled 2019-02-26 (×6): qty 1

## 2019-02-26 MED ORDER — INSULIN ASPART 100 UNIT/ML ~~LOC~~ SOLN
0.0000 [IU] | Freq: Three times a day (TID) | SUBCUTANEOUS | Status: DC
  Administered 2019-02-27: 12:00:00 11 [IU] via SUBCUTANEOUS
  Administered 2019-02-27: 18:00:00 8 [IU] via SUBCUTANEOUS
  Administered 2019-02-27: 08:00:00 3 [IU] via SUBCUTANEOUS
  Administered 2019-02-28: 17:00:00 5 [IU] via SUBCUTANEOUS
  Administered 2019-02-28: 08:00:00 2 [IU] via SUBCUTANEOUS
  Administered 2019-02-28: 12:00:00 5 [IU] via SUBCUTANEOUS
  Administered 2019-03-01: 12:00:00 11 [IU] via SUBCUTANEOUS
  Administered 2019-03-01: 08:00:00 5 [IU] via SUBCUTANEOUS
  Administered 2019-03-02: 09:00:00 3 [IU] via SUBCUTANEOUS
  Administered 2019-03-02: 12:00:00 8 [IU] via SUBCUTANEOUS

## 2019-02-26 NOTE — Treatment Plan (Signed)
[facility] to [place] 85 yo with COPD, DM, afib, CKD III who has been seen at [facility] on 2 previous occasions.  Most recently discharged on 11/24 after steroids and remdesivir. He's representing today after worsening SOB, was started on steroid taper by his PCP outpatient.  Presented to [facility] ED with hypoxia requiring 4 L.  Vitals are notable for hypoxia requiring 4 L.  Normotensive.  Most recently charted HR was 116, but previous wnl.  Afebrile.  Labs notable for creatinine 1.68.  Hstrop 39 with delta pending.  WBC 14.5 and platelets 136.  Procal is <0.1.  CXR concerning for multifocal pneumonia vs edema.  Accepted to [facility] for recurrent AHRF.  Requested delta troponin, BNP, inflammatory markers, CT chest, and steroids from EDP.

## 2019-02-26 NOTE — ED Triage Notes (Signed)
Pt's daughter reports that pt tested positive for COVID on 11/16 and he has had some difficulty breathing. Pt went to his MD today and was advised to come to the ED. Pt was put on O2 2 days ago and it was increased to 4L today.

## 2019-02-26 NOTE — Progress Notes (Signed)
Pharmacy Antibiotic Note  Johnathan Hudson is a 85 y.o. male with PMH of COPD, DM, afib, CKD III who has been seen at [facility] on 2 previous occasions. Pt was most recently discharged on 11/24 after steroids and remdesivir. He presents back to [facility] after worsening SOB. There is concern for pneumonia. Pharmacy has been consulted to dose cefepime and vancomycin.  Pt's WBC is elevated at 14.5 with a negative PCT (<0.1). Pt is currently afebrile but is hypotensive with a HR of 92/60. Pt has low O2 sats of 92% on 4L nasal cannula with a respiratory rate of 20. Scr is slightly elevated at 1.68 from a baseline of ~1.4.  Plan: Give vancomycin IV 1500 mg x 1 as loading dose Maintenance dose: Vancomycin 750 mg IV Q 24 hrs. Goal AUC 400-550. Expected AUC: 520.7 SCr used: 1.68  Cefepime 2g IV Q 24 hours Monitor renal function, WBC, temp, cultures, and clinical status F/U on length of therapy     Temp (24hrs), Avg:97.9 F (36.6 C), Min:97.6 F (36.4 C), Max:98.2 F (36.8 C)  Recent Labs  Lab 03/24/2019 1101 03/16/2019 1116  WBC 14.5*  --   CREATININE  --  1.68*    Estimated Creatinine Clearance: 30.4 mL/min (A) (by C-G formula based on SCr of 1.68 mg/dL (H)).    Allergies  Allergen Reactions  . Sulfa Antibiotics Rash  . Lipitor [Atorvastatin] Other (See Comments)    Myalgia    Antimicrobials this admission: Vancomycin 12/03 >>  Cefepime 12/03 >>  Microbiology results: 12/03 BCx: Sent 12/03 Sputum:Sent 12/03 MRSA PCR: Sent  Thank you for allowing pharmacy to be a part of this patient's care.  Sherren Kerns, PharmD PGY1 Acute Care Pharmacy Resident 03/09/2019 6:24 PM

## 2019-02-26 NOTE — H&P (Signed)
History and Physical    Johnathan Hudson [id number] DOB: [birth year] DOA: 03/16/2019  PCP: Tracie Harrier, MD  Patient coming from: home  I have personally briefly reviewed patient's old medical records in [place]  Chief Complaint: shortness of breath  HPI: Johnathan Hudson is Johnathan Hudson 85 y.o. male with medical history significant of COPD, T2DM, atrial fib on eliquis, depression/anxiety, HLD and multiple other medical problems with recent COVID 19 diagnosis on 11/18 with 2 prior visits to the [facility] for treatment, representing with shortness of breath.  Mr. Soderman has previously been seen at the [facility] on 11/18 and 11/20.  He was most recently discharged on 11/24 after completing Johnathan Hudson course of steroids and remdesivir.  At that point in time he was improved on RA.  He was discharged and did well until 1-2 days ago when he began to have worsening SOB.  His daughter noted Johnathan Hudson fever to 100.8.  He saw his PCP who started steroids and oxygen, but he represented today to the ED with worsening SOB.  He denies chest pain or orthopnea.  He does occasionally have PND.  Denies abdominal pain, nausea, vomiting.  He denies any recent weight gain or LE swelling.  Denies smoking or etoh use.    ED Course: Labs, CXR, CT chest, EKG, steroids.  Admit to [facility].  Review of Systems: As per HPI otherwise 10 point review of systems negative.   Past Medical History:  Diagnosis Date   Anxiety    Arthritis    rheumatoid   Benign essential tremor    Cardiomyopathy (HCC)    mild   Chronic kidney disease    kidney stones   COPD (chronic obstructive pulmonary disease) (HCC)    Diabetes mellitus without complication (HCC)    Dysrhythmia    atrial fib   Episodic atrial fibrillation ([place])    Hyperlipemia    Hypertension    Lumbar stenosis with neurogenic claudication    Osteoarthritis    Psoriasis    Renal insufficiency     Past Surgical History:  Procedure Laterality  Date   CYSTOSCOPY  1993 and 2004   CYSTOSCOPY WITH URETHRAL DILATATION Left 02/05/2017   Procedure: CYSTOSCOPY WITH URETHRAL DILATATION;  Surgeon: Royston Cowper, MD;  Location: [facility] ORS;  Service: Urology;  Laterality: Left;   INSERT / REPLACE / REMOVE PACEMAKER     KIDNEY STONE SURGERY Left    LASER OF PROSTATE W/ GREEN LIGHT PVP  2004   photovaporization of prostate with greelight laser    ORIF ANKLE FRACTURE Right    PACEMAKER INSERTION     PACEMAKER INSERTION N/Johnathan Hudson 03/01/2015   Procedure: PACEMAKER CHANGE OUT;  Surgeon: Isaias Cowman, MD;  Location: [facility] ORS;  Service: Cardiovascular;  Laterality: N/Johnathan Hudson;   PENILE PROSTHESIS IMPLANT  1995   inflatable   RETINAL DETACHMENT SURGERY Left    TONSILLECTOMY       reports that he has quit smoking. His smokeless tobacco use includes chew. He reports that he does not drink alcohol or use drugs.  Allergies  Allergen Reactions   Sulfa Antibiotics Rash   Lipitor [Atorvastatin] Other (See Comments)    Myalgia    Family History  Problem Relation Age of Onset   Cancer Mother    Alzheimer's disease Father    Prior to Admission medications   Medication Sig Start Date End Date Taking? Authorizing Provider  acetaminophen (TYLENOL) 500 MG tablet Take 1,000 mg 3 (three) times daily  as needed by mouth for moderate pain.     [provider]  albuterol (VENTOLIN HFA) 108 (90 Base) MCG/ACT inhaler Inhale 2 puffs into the lungs every 6 (six) hours as needed for wheezing or shortness of breath. 02/07/19   Loletha Grayer, MD  ALPRAZolam Duanne Moron) 0.25 MG tablet Take 0.25 mg by mouth at bedtime as needed for sleep. 01/27/19   [provider]  apixaban (ELIQUIS) 2.5 MG TABS tablet Take 2.5 mg by mouth 2 (two) times daily.    [provider]  budesonide-formoterol (SYMBICORT) 80-4.5 MCG/ACT inhaler Inhale 2 puffs into the lungs 2 (two) times daily. 02/07/19   Loletha Grayer, MD  dexamethasone (DECADRON) 10  MG/ML injection Inject 0.6 mLs (6 mg total) into the vein daily. 02/12/19   Lavina Hamman, MD  gabapentin (NEURONTIN) 300 MG capsule Take 300 mg by mouth 2 (two) times daily.     [provider]  HYDROcodone-acetaminophen (NORCO) 7.5-325 MG tablet Take 1 tablet by mouth 3 (three) times daily as needed for pain. 01/08/19   [provider]  Ipratropium-Albuterol (COMBIVENT) 20-100 MCG/ACT AERS respimat Inhale 1 puff into the lungs every 6 (six) hours. 02/12/19   Lavina Hamman, MD  LORazepam (ATIVAN) 0.5 MG tablet Take 1 tablet (0.5 mg total) by mouth 2 (two) times daily. 02/07/19   Loletha Grayer, MD  methenamine (HIPREX) 1 g tablet Take 1 g by mouth 2 (two) times daily with Dayelin Balducci meal.    [provider]  omeprazole (PRILOSEC) 20 MG capsule Take 20 mg by mouth 2 (two) times daily before Johnathan Hudson meal.    [provider]  propranolol (INDERAL) 20 MG tablet Take 1 tablet (20 mg total) by mouth 3 (three) times daily. 02/07/19   Loletha Grayer, MD  sertraline (ZOLOFT) 50 MG tablet Take 50 mg by mouth daily.    [provider]  simvastatin (ZOCOR) 20 MG tablet Take 20 mg by mouth every evening.     [provider]  tiotropium (SPIRIVA HANDIHALER) 18 MCG inhalation capsule Place 1 capsule (18 mcg total) into inhaler and inhale daily. 02/07/19 02/07/20  Loletha Grayer, MD  vitamin C (VITAMIN C) 500 MG tablet Take 1 tablet (500 mg total) by mouth daily. 02/12/19   Lavina Hamman, MD  zinc sulfate 220 (50 Zn) MG capsule Take 1 capsule (220 mg total) by mouth daily. 02/12/19   Lavina Hamman, MD    Physical Exam: There were no vitals filed for this visit.  Constitutional: NAD, calm, comfortable There were no vitals filed for this visit. Eyes: PERRL, lids and conjunctivae normal ENMT: Mucous membranes are moist. Posterior pharynx clear of any exudate or lesions.Normal dentition.  Neck: normal, supple, no masses, no thyromegaly Respiratory: clear to  auscultation bilaterally, no wheezing, no crackles. Slightly increased respiratory effort. On 6 L . Cardiovascular: irregularly irregular, normal rate Abdomen: no tenderness, no masses palpated. No hepatosplenomegaly. Bowel sounds positive.  Musculoskeletal: no clubbing / cyanosis. No joint deformity upper and lower extremities. Good ROM, no contractures. Normal muscle tone.  Skin: no rashes, lesions, ulcers. No induration Neurologic: CN 2-12 grossly intact. Sensation intact. Moving all extremities. Psychiatric: Normal judgment and insight. Alert and oriented x 3. Normal mood.   Labs on Admission: I have personally reviewed following labs and imaging studies  CBC: Recent Labs  Lab 03/20/2019 1101  WBC 14.5*  HGB 13.0  HCT 41.0  MCV 92.1  PLT [id number]*   Basic Metabolic Panel: Recent Labs  Lab  03/12/2019 1116  NA 131*  K 4.0  CL 99  CO2 20*  GLUCOSE 231*  BUN 32*  CREATININE 1.68*  CALCIUM 8.9   GFR: Estimated Creatinine Clearance: 30.4 mL/min (Artelia Game) (by C-G formula based on SCr of 1.68 mg/dL (H)). Liver Function Tests: Recent Labs  Lab 03/02/2019 1116  AST 34  ALT 17  ALKPHOS 53  BILITOT 0.6  PROT 6.3*  ALBUMIN 2.8*   No results for input(s): LIPASE, AMYLASE in the last 168 hours. No results for input(s): AMMONIA in the last 168 hours. Coagulation Profile: No results for input(s): INR, PROTIME in the last 168 hours. Cardiac Enzymes: No results for input(s): CKTOTAL, CKMB, CKMBINDEX, TROPONINI in the last 168 hours. BNP (last 3 results) No results for input(s): PROBNP in the last 8760 hours. HbA1C: No results for input(s): HGBA1C in the last 72 hours. CBG: No results for input(s): GLUCAP in the last 168 hours. Lipid Profile: No results for input(s): CHOL, HDL, LDLCALC, TRIG, CHOLHDL, LDLDIRECT in the last 72 hours. Thyroid Function Tests: No results for input(s): TSH, T4TOTAL, FREET4, T3FREE, THYROIDAB in the last 72 hours. Anemia Panel: No results for input(s):  VITAMINB12, FOLATE, FERRITIN, TIBC, IRON, RETICCTPCT in the last 72 hours. Urine analysis:    Component Value Date/Time   COLORURINE YELLOW (Janard Culp) 02/04/2019 0946   APPEARANCEUR CLEAR (Mykah Shin) 02/04/2019 0946   LABSPEC 1.014 02/04/2019 0946   PHURINE 5.0 02/04/2019 0946   GLUCOSEU NEGATIVE 02/04/2019 0946   HGBUR NEGATIVE 02/04/2019 0946   BILIRUBINUR NEGATIVE 02/04/2019 0946   KETONESUR NEGATIVE 02/04/2019 0946   PROTEINUR NEGATIVE 02/04/2019 0946   NITRITE NEGATIVE 02/04/2019 0946   LEUKOCYTESUR NEGATIVE 02/04/2019 0946    Radiological Exams on Admission: Ct Chest Wo Contrast  Result Date: 03/25/2019 CLINICAL DATA:  Pt's daughter reports that pt tested positive for COVID on 11/16 and he has had some difficulty breathing. Pt went to his MD today and was advised to come to the ED. Pt was put on O2 2 days ago and it was increased to 4L today. EXAM: CT CHEST WITHOUT CONTRAST TECHNIQUE: Multidetector CT imaging of the chest was performed following the standard protocol without IV contrast. COMPARISON:  03/05/2018 FINDINGS: Cardiovascular: Heart is mildly enlarged. No pericardial effusion. Three-vessel coronary artery calcifications. Aorta is normal in caliber. Mild aortic atherosclerotic calcifications. Mediastinum/Nodes: Unremarkable thyroid. No neck base or axillary masses or enlarged lymph nodes. No mediastinal or hilar masses. Prominent prevascular node measuring 9 mm in short axis. Nodes are stable when compared to the prior chest CT, none enlarged by size criteria. Trachea is unremarkable. Small to moderate size hiatal hernia. Esophagus unremarkable. Lungs/Pleura: Extensive bilateral airspace lung opacities, which are predominantly ground-glass with small areas of more confluent opacity in Zakiya Sporrer bronchovascular distribution. Lung opacities are most prominent in the upper lobes, but seen in right middle and lower lobes as well. Trace right pleural effusion. There is mild dependent atelectasis in the lower  lobes, greater on the right. No pneumothorax. Upper Abdomen: No acute findings. Musculoskeletal: No fracture or acute finding.  No bone lesion. IMPRESSION: 1. Extensive bilateral airspace lung opacities, which are predominantly ground-glass. This is presumably due to COVID-19 pneumonia given the history of Dadrian Ballantine positive COVID-19 test on 02/09/2019. If patient does not have symptoms of infection, however, pulmonary edema should be considered in the differential. Trace right pleural effusion. 2. No other acute abnormalities. 3. Coronary artery calcifications, mild cardiomegaly and mild aortic atherosclerotic calcifications. Aortic Atherosclerosis (ICD10-I70.0). Electronically Signed   By: Shanon Brow  Ormond M.D.   On: 03/01/2019 14:24   Dg Chest Port 1 View  Result Date: 02/24/2019 CLINICAL DATA:  Shortness of breath EXAM: PORTABLE CHEST 1 VIEW COMPARISON:  02/11/2019 FINDINGS: Left-sided implanted cardiac device. Stable mild cardiomegaly. There are multifocal patchy airspace opacities throughout both lungs, new from prior. Lung volumes are low. No pleural effusion or pneumothorax. IMPRESSION: Multifocal patchy airspace opacities throughout both lungs which may reflect multifocal pneumonia versus pulmonary edema. Electronically Signed   By: Davina Poke M.D.   On: 03/10/2019 11:30    EKG: Independently reviewed. As noted below  Assessment/Plan Active Problems:   Type 2 diabetes mellitus with stage 3a chronic kidney disease, without long-term current use of insulin (HCC)   Atrial fibrillation, chronic (HCC)   Acute respiratory failure with hypoxia (HCC)   COVID-19 virus infection   Hypoxia   COPD (chronic obstructive pulmonary disease) (HCC)   Acute Hypoxic Respiratory Failure   COVID 19 Pneumonia s/p treatment with steroids and Remdesivir   Possible Bacterial Pneumonia: CT with bilateral airspace opacities, ground glass - consider COVID pneumonia vs pulmonary edema Fever to 100.8 yesterday per daughter  report, will start abx Will start steroids as well MRSA PCR, blood cx, sputum cx - RVP and flu swabs pending - narrow and deescalate abx as able - follow procalcitonin - negative initially I/O, daily weights - BNP elevated, but lower than last admission, SBP in 90's, hold on trial of lasix for now - of note, echo from last admission with EF 60-65%, unable to eval diastolic function Daily inflammatory labs  COVID-19 Labs  No results for input(s): DDIMER, FERRITIN, LDH, CRP in the last 72 hours.  Lab Results  Component Value Date   SARSCOV2NAA POSITIVE (Ena Demary) 02/11/2019   [place] NEGATIVE 02/04/2019   Atrial Fibrillation: currently rate controlled.  Continue propanolol with holding parameters.  Continue eliquis.  Elevated troponin: troponins flat with delta <20.  Low suspicion for ACS.  EKG appears similar to priors with afib, but new q in lead III as well as T wave inversions in V2-V6 (appears new in V2 and V3).  Will continue to monitor. Repeat EKG in AM.  Type 2 Diabetes: start basal insulin.  SSI.  Follow with steroids.  Not on home meds.  COPD: continue home meds, no clear wheezing on exam  Anxiety   Depression: xanax, ativan ordered  History of Bradycardia: s/p pacemaker  CKD III: appears close to baseline  Chronic Back Pain: continue home norco  HLD: continue statin  Leukocytosis: likely 2/2 steroids, possible bacterial infection, though less likely with neg pct - follow  Thrombocytopenia: follow  DVT prophylaxis: eliquis  Code Status: DNR - confirmed with patient on discussion, also discussed with daughter Richardean Chimera) Family Communication: daughter  Disposition Plan: pending  Consults called: none  Admission status: inpatient given AHRF requiring supplemental O2   Fayrene Helper MD [facility] Pager AMION  If 7PM-7AM, please contact night-coverage [email or web address] Password [facility]  03/18/2019, 7:16 PM

## 2019-02-26 NOTE — ED Notes (Signed)
[facility] at bedside to take pt.  Pt stable on leaving

## 2019-02-26 NOTE — ED Provider Notes (Signed)
[facility] Emergency Department Provider Note   ____________________________________________   First MD Initiated Contact with Patient 03/13/2019 1103     (approximate)  I have reviewed the triage vital signs and the nursing notes.   HISTORY  Chief Complaint Shortness of Breath    HPI Johnathan Hudson is a 85 y.o. male with past medical history of atrial fibrillation, COPD, diabetes, and CKD who presents to the ED for shortness of breath.  History is limited from the patient and the majority of history is obtained via phone with patient's daughter.  He was initially diagnosed with COVID-19 on November 18, admitted to [facility] and subsequently discharged on November 24, when he was not requiring any oxygen.  He initially seemed to do well, but had a worsening of his condition earlier this week.  His daughter states that he became increasingly short of breath with a productive cough.  He was reevaluated in his PCPs office 2 days ago, when he was started on oxygen at 2 L nasal cannula.  He was also restarted on a course of steroids.  Daughter states that he has continued to worsen since then and EMS was called when he had worsening shortness of breath today.  He was noted to have O2 sats in the 80s on the 2 L nasal cannula.        Past Medical History:  Diagnosis Date  . Anxiety   . Arthritis    rheumatoid  . Benign essential tremor   . Cardiomyopathy ([place])    mild  . Chronic kidney disease    kidney stones  . COPD (chronic obstructive pulmonary disease) ([place])   . Diabetes mellitus without complication ([place])   . Dysrhythmia    atrial fib  . Episodic atrial fibrillation ([place])   . Hyperlipemia   . Hypertension   . Lumbar stenosis with neurogenic claudication   . Osteoarthritis   . Psoriasis   . Renal insufficiency     Patient Active Problem List   Diagnosis Date Noted  . Pneumonia due to COVID-19 virus 02/13/2019  . Respiratory tract infection  due to COVID-19 virus 02/13/2019  . Bilateral pleural effusion 02/13/2019  . CKD (chronic kidney disease), stage III 02/13/2019  . Anemia of chronic disease 02/13/2019  . HCAP (healthcare-associated pneumonia)   . Acute respiratory failure with hypoxia ([place]) 02/11/2019  . Atrial fibrillation, chronic ([place])   . Tremor   . Anxiety and depression   . COPD with acute exacerbation ([place])   . Sepsis ([place]) 02/04/2019  . Atrial fibrillation with RVR ([place])   . Community acquired pneumonia   . Hypotension   . Type 2 diabetes mellitus with stage 3a chronic kidney disease, without long-term current use of insulin ([place])     Past Surgical History:  Procedure Laterality Date  . CYSTOSCOPY  1993 and 2004  . CYSTOSCOPY WITH URETHRAL DILATATION Left 02/05/2017   Procedure: CYSTOSCOPY WITH URETHRAL DILATATION;  Surgeon: Royston Cowper, MD;  Location: [facility] ORS;  Service: Urology;  Laterality: Left;  . INSERT / REPLACE / REMOVE PACEMAKER    . KIDNEY STONE SURGERY Left   . LASER OF PROSTATE W/ GREEN LIGHT PVP  2004   photovaporization of prostate with greelight laser   . ORIF ANKLE FRACTURE Right   . PACEMAKER INSERTION    . PACEMAKER INSERTION N/A 03/01/2015   Procedure: PACEMAKER CHANGE OUT;  Surgeon: Isaias Cowman, MD;  Location: [facility] ORS;  Service: Cardiovascular;  Laterality: N/A;  .  PENILE PROSTHESIS IMPLANT  1995   inflatable  . RETINAL DETACHMENT SURGERY Left   . TONSILLECTOMY      Prior to Admission medications   Medication Sig Start Date End Date Taking? Authorizing Provider  acetaminophen (TYLENOL) 500 MG tablet Take 1,000 mg 3 (three) times daily as needed by mouth for moderate pain.     [provider]  albuterol (VENTOLIN HFA) 108 (90 Base) MCG/ACT inhaler Inhale 2 puffs into the lungs every 6 (six) hours as needed for wheezing or shortness of breath. 02/07/19   Loletha Grayer, MD  ALPRAZolam Duanne Moron) 0.25 MG tablet Take 0.25 mg by mouth at bedtime as needed for sleep.  01/27/19   [provider]  apixaban (ELIQUIS) 2.5 MG TABS tablet Take 2.5 mg by mouth 2 (two) times daily.    [provider]  budesonide-formoterol (SYMBICORT) 80-4.5 MCG/ACT inhaler Inhale 2 puffs into the lungs 2 (two) times daily. 02/07/19   Loletha Grayer, MD  dexamethasone (DECADRON) 10 MG/ML injection Inject 0.6 mLs (6 mg total) into the vein daily. 02/12/19   Lavina Hamman, MD  gabapentin (NEURONTIN) 300 MG capsule Take 300 mg by mouth 2 (two) times daily.     [provider]  HYDROcodone-acetaminophen (NORCO) 7.5-325 MG tablet Take 1 tablet by mouth 3 (three) times daily as needed for pain. 01/08/19   [provider]  Ipratropium-Albuterol (COMBIVENT) 20-100 MCG/ACT AERS respimat Inhale 1 puff into the lungs every 6 (six) hours. 02/12/19   Lavina Hamman, MD  LORazepam (ATIVAN) 0.5 MG tablet Take 1 tablet (0.5 mg total) by mouth 2 (two) times daily. 02/07/19   Loletha Grayer, MD  methenamine (HIPREX) 1 g tablet Take 1 g by mouth 2 (two) times daily with a meal.    [provider]  omeprazole (PRILOSEC) 20 MG capsule Take 20 mg by mouth 2 (two) times daily before a meal.    [provider]  propranolol (INDERAL) 20 MG tablet Take 1 tablet (20 mg total) by mouth 3 (three) times daily. 02/07/19   Loletha Grayer, MD  sertraline (ZOLOFT) 50 MG tablet Take 50 mg by mouth daily.    [provider]  simvastatin (ZOCOR) 20 MG tablet Take 20 mg by mouth every evening.     [provider]  tiotropium (SPIRIVA HANDIHALER) 18 MCG inhalation capsule Place 1 capsule (18 mcg total) into inhaler and inhale daily. 02/07/19 02/07/20  Loletha Grayer, MD  vitamin C (VITAMIN C) 500 MG tablet Take 1 tablet (500 mg total) by mouth daily. 02/12/19   Lavina Hamman, MD  zinc sulfate 220 (50 Zn) MG capsule Take 1 capsule (220 mg total) by mouth daily. 02/12/19   Lavina Hamman, MD    Allergies Sulfa antibiotics and Lipitor  [atorvastatin]  Family History  Problem Relation Age of Onset  . Cancer Mother   . Alzheimer's disease Father     Social History Social History   Tobacco Use  . Smoking status: Former Research scientist (life sciences)  . Smokeless tobacco: Current User    Types: Chew  Substance Use Topics  . Alcohol use: No  . Drug use: No    Review of Systems  Constitutional: Positive for subjective fevers and chills. Eyes: No visual changes. ENT: No sore throat. Cardiovascular: Denies chest pain. Respiratory: Positive for cough and shortness of breath. Gastrointestinal: No abdominal pain.  No nausea, no vomiting.  No diarrhea.  No constipation. Genitourinary: Negative for dysuria. Musculoskeletal: Negative for back pain. Skin: Negative for  rash. Neurological: Negative for headaches, focal weakness or numbness.  ____________________________________________   PHYSICAL EXAM:  VITAL SIGNS: ED Triage Vitals  Enc Vitals Group     BP --      Pulse Rate 03/26/2019 1054 (!) 1     Resp --      Temp 03/16/2019 1054 97.6 F (36.4 C)     Temp Source 03/02/2019 1054 Oral     SpO2 --      Weight 03/01/2019 1059 160 lb (72.6 kg)     Height 03/03/2019 1059 5\' 4"  (1.626 m)     Head Circumference --      Peak Flow --      Pain Score 03/02/2019 1058 10     Pain Loc --      Pain Edu? --      Excl. in [place]? --     Constitutional: Alert and oriented.  Tremulous. Eyes: Conjunctivae are normal. Head: Atraumatic. Nose: No congestion/rhinnorhea. Mouth/Throat: Mucous membranes are moist. Neck: Normal ROM Cardiovascular: Normal rate, regular rhythm. Grossly normal heart sounds. Respiratory: Mild respiratory distress, tachypneic.  No retractions. Lungs CTAB. Gastrointestinal: Soft and nontender. No distention. Genitourinary: deferred Musculoskeletal: No lower extremity tenderness nor edema. Neurologic:  Normal speech and language. No gross focal neurologic deficits are appreciated. Skin:  Skin is warm, dry and intact. No rash  noted. Psychiatric: Mood and affect are normal. Speech and behavior are normal.  ____________________________________________   LABS (all labs ordered are listed, but only abnormal results are displayed)  Labs Reviewed  CBC - Abnormal; Notable for the following components:      Result Value   WBC 14.5 (*)    Platelets 136 (*)    All other components within normal limits  COMPREHENSIVE METABOLIC PANEL - Abnormal; Notable for the following components:   Sodium 131 (*)    CO2 20 (*)    Glucose, Bld 231 (*)    BUN 32 (*)    Creatinine, Ser 1.68 (*)    Total Protein 6.3 (*)    Albumin 2.8 (*)    GFR calc non Af Amer 37 (*)    GFR calc Af Amer 43 (*)    All other components within normal limits  BRAIN NATRIURETIC PEPTIDE - Abnormal; Notable for the following components:   B Natriuretic Peptide 352.0 (*)    All other components within normal limits  FIBRIN DERIVATIVES D-DIMER ([facility] ONLY) - Abnormal; Notable for the following components:   Fibrin derivatives D-dimer ([facility]) 730.13 (*)    All other components within normal limits  TROPONIN I (HIGH SENSITIVITY) - Abnormal; Notable for the following components:   Troponin I (High Sensitivity) 39 (*)    All other components within normal limits  TROPONIN I (HIGH SENSITIVITY) - Abnormal; Notable for the following components:   Troponin I (High Sensitivity) 42 (*)    All other components within normal limits  RESPIRATORY PANEL BY PCR  PROCALCITONIN  INFLUENZA PANEL BY PCR (TYPE A & B)  C-REACTIVE PROTEIN  FERRITIN   ____________________________________________  EKG  ED ECG REPORT I, Blake Divine, the attending physician, personally viewed and interpreted this ECG.   Date: 02/25/2019  EKG Time: 11:15  Rate: 101  Rhythm: atrial fibrillation, rate 101  Axis: Normal  Intervals:none  ST&T Change: PVC, T wave inversions inferolaterally   PROCEDURES  Procedure(s) performed (including Critical Care):  .Critical Care  Performed by: Blake Divine, MD Authorized by: Blake Divine, MD   Critical care provider statement:  Critical care time (minutes):  45   Critical care time was exclusive of:  Separately billable procedures and treating other patients and teaching time   Critical care was necessary to treat or prevent imminent or life-threatening deterioration of the following conditions:  Respiratory failure   Critical care was time spent personally by me on the following activities:  Discussions with consultants, evaluation of patient's response to treatment, examination of patient, ordering and performing treatments and interventions, ordering and review of laboratory studies, ordering and review of radiographic studies, pulse oximetry, re-evaluation of patient's condition, obtaining history from patient or surrogate and review of old charts   I assumed direction of critical care for this patient from another provider in my specialty: no       ____________________________________________   INITIAL IMPRESSION / ASSESSMENT AND PLAN / ED COURSE       85 year old male, diagnosed with COVID-19 on November 18, now returns to the ED for worsening shortness of breath.  He was recently restarted on oxygen therapy by his PCP as well as a course of steroids.  He has continued to worsen since then with hypoxia on the 2 L nasal cannula.  His O2 sats improved and remained steady at 90 to 91% on 4 L nasal cannula, minimal respiratory distress.  Chest x-ray is consistent with COVID-19 and low suspicion for superimposed pneumonia given procalcitonin is undetectable.  Remainder of labs are unremarkable, show patient's baseline chronic kidney disease.  Case was discussed with Dr. Florene Glen at [facility], who accepts patient in transfer.  He agrees with plan to hold off on antibiotics for now.      ____________________________________________   FINAL CLINICAL IMPRESSION(S) / ED DIAGNOSES  Final diagnoses:   Pneumonia due to COVID-19 virus  Chronic kidney disease, unspecified CKD stage  Chills     ED Discharge Orders    None       Note:  This document was prepared using Dragon voice recognition software and may include unintentional dictation errors.   Blake Divine, MD 03/20/2019 [phone]

## 2019-02-26 NOTE — Plan of Care (Signed)

## 2019-02-26 NOTE — ED Notes (Signed)
Pt resting comfortably at this time. NAD.

## 2019-02-26 NOTE — ED Notes (Signed)
Called and informed daughter [name] that pt is being transported to [facility].

## 2019-02-27 ENCOUNTER — Other Ambulatory Visit: Payer: Self-pay

## 2019-02-27 ENCOUNTER — Encounter (HOSPITAL_COMMUNITY): Payer: Self-pay

## 2019-02-27 LAB — COMPREHENSIVE METABOLIC PANEL
ALT: 18 U/L (ref 0–44)
AST: 24 U/L (ref 15–41)
Albumin: 2.5 g/dL — ABNORMAL LOW (ref 3.5–5.0)
Alkaline Phosphatase: 47 U/L (ref 38–126)
Anion gap: 11 (ref 5–15)
BUN: 36 mg/dL — ABNORMAL HIGH (ref 8–23)
CO2: 21 mmol/L — ABNORMAL LOW (ref 22–32)
Calcium: 8.6 mg/dL — ABNORMAL LOW (ref 8.9–10.3)
Chloride: 103 mmol/L (ref 98–111)
Creatinine, Ser: 1.59 mg/dL — ABNORMAL HIGH (ref 0.61–1.24)
GFR calc Af Amer: 46 mL/min — ABNORMAL LOW (ref >60)
GFR calc non Af Amer: 40 mL/min — ABNORMAL LOW (ref >60)
Glucose, Bld: 210 mg/dL — ABNORMAL HIGH (ref 70–99)
Potassium: 4.6 mmol/L (ref 3.5–5.1)
Sodium: 135 mmol/L (ref 135–145)
Total Bilirubin: 0.7 mg/dL (ref 0.3–1.2)
Total Protein: 5.7 g/dL — ABNORMAL LOW (ref 6.5–8.1)

## 2019-02-27 LAB — CBC WITH DIFFERENTIAL/PLATELET
Abs Immature Granulocytes: 0.07 10*3/uL (ref 0.00–0.07)
Basophils Absolute: 0 10*3/uL (ref 0.0–0.1)
Basophils Relative: 0 %
Eosinophils Absolute: 0 10*3/uL (ref 0.0–0.5)
Eosinophils Relative: 0 %
HCT: 35.7 % — ABNORMAL LOW (ref 39.0–52.0)
Hemoglobin: 11.4 g/dL — ABNORMAL LOW (ref 13.0–17.0)
Immature Granulocytes: 1 %
Lymphocytes Relative: 4 %
Lymphs Abs: 0.3 10*3/uL — ABNORMAL LOW (ref 0.7–4.0)
MCH: 29.2 pg (ref 26.0–34.0)
MCHC: 31.9 g/dL (ref 30.0–36.0)
MCV: 91.5 fL (ref 80.0–100.0)
Monocytes Absolute: 0.4 10*3/uL (ref 0.1–1.0)
Monocytes Relative: 4 %
Neutro Abs: 8.6 10*3/uL — ABNORMAL HIGH (ref 1.7–7.7)
Neutrophils Relative %: 91 %
Platelets: 127 10*3/uL — ABNORMAL LOW (ref 150–400)
RBC: 3.9 MIL/uL — ABNORMAL LOW (ref 4.22–5.81)
RDW: 13.8 % (ref 11.5–15.5)
WBC: 9.3 10*3/uL (ref 4.0–10.5)
nRBC: 0 % (ref 0.0–0.2)

## 2019-02-27 LAB — FERRITIN: Ferritin: 483 ng/mL — ABNORMAL HIGH (ref 24–336)

## 2019-02-27 LAB — BRAIN NATRIURETIC PEPTIDE: B Natriuretic Peptide: 307.4 pg/mL — ABNORMAL HIGH (ref 0.0–100.0)

## 2019-02-27 LAB — PROCALCITONIN: Procalcitonin: <0.1 ng/mL

## 2019-02-27 LAB — GLUCOSE, CAPILLARY
Glucose-Capillary: 183 mg/dL — ABNORMAL HIGH (ref 70–99)
Glucose-Capillary: 289 mg/dL — ABNORMAL HIGH (ref 70–99)
Glucose-Capillary: 303 mg/dL — ABNORMAL HIGH (ref 70–99)

## 2019-02-27 LAB — MRSA PCR SCREENING: MRSA by PCR: NEGATIVE

## 2019-02-27 LAB — MAGNESIUM: Magnesium: 2.2 mg/dL (ref 1.7–2.4)

## 2019-02-27 LAB — D-DIMER, QUANTITATIVE: D-Dimer, Quant: 0.49 ug/mL-FEU (ref 0.00–0.50)

## 2019-02-27 LAB — C-REACTIVE PROTEIN: CRP: 11.1 mg/dL — ABNORMAL HIGH (ref <1.0)

## 2019-02-27 MED ORDER — FUROSEMIDE 10 MG/ML IJ SOLN
40.0000 mg | Freq: Once | INTRAMUSCULAR | Status: DC
  Filled 2019-02-27: qty 4

## 2019-02-27 MED ORDER — ALBUTEROL SULFATE HFA 108 (90 BASE) MCG/ACT IN AERS
2.0000 | INHALATION_SPRAY | RESPIRATORY_TRACT | Status: DC | PRN
  Filled 2019-02-27: qty 6.7

## 2019-02-27 MED ORDER — ALBUTEROL SULFATE HFA 108 (90 BASE) MCG/ACT IN AERS
2.0000 | INHALATION_SPRAY | Freq: Four times a day (QID) | RESPIRATORY_TRACT | Status: DC
  Administered 2019-02-27 – 2019-03-03 (×16): 2 via RESPIRATORY_TRACT
  Filled 2019-02-27: qty 6.7

## 2019-02-27 MED ORDER — FUROSEMIDE 10 MG/ML IJ SOLN
40.0000 mg | Freq: Once | INTRAMUSCULAR | Status: AC
  Administered 2019-02-27: 15:00:00 40 mg via INTRAVENOUS

## 2019-02-27 MED ORDER — FUROSEMIDE 10 MG/ML IJ SOLN
40.0000 mg | Freq: Once | INTRAMUSCULAR | Status: AC
  Administered 2019-02-27: 11:00:00 40 mg via INTRAVENOUS
  Filled 2019-02-27: qty 4

## 2019-02-27 NOTE — Evaluation (Signed)
Occupational Therapy Evaluation Patient Details Name: Johnathan Hudson MRN: [id number] DOB: Sep 16, 1933 Today's Date: 02/27/2019    History of Present Illness Pt is an 85 y.o. male readmitted 03/12/2019 with acute hypoxic respiratory failure. Of note, two recent admissions 11/11-11/14 with sepsis and PNA, and 11/18-11/24 with COPD exacerbation and PNA, d/c home on RA. Other PMH includes OA, lumbar stenosis, HTN, afib, DM, CKD, benign essential tremor, pacemaker.   Clinical Impression   This 85 y/o male presents with the above. Pt with recent admit and d/c home from [facility] on RA. Prior to initial admission pt was independent with ADL, iADL and functional mobility. Pt now presenting with increased weakness, decreased standing balance and poor respiratory status/endurance with minimal activity. Pt tolerating taking few steps to recliner this session with minA (+2 safety); currently requiring min-modA for LB ADL and setup/minguard for seated UB ADL. Pt on 15L HFNC with lowest SpO2 noted 86% with transitions. Spoke with pt's daughter end of session who reports family able to provide 24hr supervision/assist at time of discharge. Currently recommend follow up [facility] services after discharge (pending progress) to progress pt towards his PLOF. Will continue to follow acutely to progress pt towards established OT goals.     Follow Up Recommendations  Home health OT;Supervision/Assistance - 24 hour(pending progress)    Equipment Recommendations  None recommended by OT           Precautions / Restrictions Precautions Precautions: Fall;Other (comment) Precaution Comments: Currently 15L HFNC; tremor at rest and also with intention Restrictions Weight Bearing Restrictions: No      Mobility Bed Mobility Overal bed mobility: Needs Assistance Bed Mobility: Supine to Sit     Supine to sit: Min assist;HOB elevated     General bed mobility comments: MinA for HHA to elevate trunk  Transfers Overall transfer  level: Needs assistance Equipment used: 1 person hand held assist Transfers: Sit to/from Stand Sit to Stand: Min assist         General transfer comment: performed multiple sit<>stands with minA for HHA to maintain balance; very SOB on 15L O2 HFNC    Balance Overall balance assessment: Needs assistance Sitting-balance support: Feet unsupported Sitting balance-Leahy Scale: Fair       Standing balance-Leahy Scale: Fair Standing balance comment: Can static stand without UE support, stability improved with HHA while performing posterior pericare                           ADL either performed or assessed with clinical judgement   ADL Overall ADL's : Needs assistance/impaired Eating/Feeding: Set up;Sitting   Grooming: Wash/dry face;Set up;Sitting Grooming Details (indicate cue type and reason): seated in recliner Upper Body Bathing: Min guard;Sitting   Lower Body Bathing: Moderate assistance;Sit to/from stand Lower Body Bathing Details (indicate cue type and reason): pt able to wash bottom standing from recliner with minA for balance (+2 present for safety this session) Upper Body Dressing : Min guard;Set up;Sitting   Lower Body Dressing: Moderate assistance;Sit to/from stand   Toilet Transfer: Minimal assistance;+2 for safety/equipment;Stand-pivot   Toileting- Clothing Manipulation and Hygiene: Moderate assistance;+2 for safety/equipment;Sit to/from stand       Functional mobility during ADLs: Minimal assistance;+2 for safety/equipment(few steps to recliner) General ADL Comments: pt with increased weakness, decreased endurance and poor respiratory status      Vision         Perception     Praxis      Pertinent Vitals/Pain  Pain Assessment: Faces Faces Pain Scale: Hurts a little bit Pain Location: "Sore and stiff all over" Pain Descriptors / Indicators: Sore Pain Intervention(s): Repositioned;Monitored during session     Hand Dominance Right    Extremity/Trunk Assessment Upper Extremity Assessment Upper Extremity Assessment: Generalized weakness(baseline tremors bil UEs)   Lower Extremity Assessment Lower Extremity Assessment: Defer to PT evaluation;Generalized weakness       Communication Communication Communication: HOH   Cognition Arousal/Alertness: Awake/alert Behavior During Therapy: WFL for tasks assessed/performed Overall Cognitive Status: Within Functional Limits for tasks assessed                                     General Comments  Good recall from previous admission of correct use for IS/flutter valve, able to demonstrate. Called daughter [name]) in room for pt to talk to on speaker phone; family to bring pt's dentures, also reports they can provide 24/7 assist as needed    Exercises Exercises: Other exercises Other Exercises Other Exercises: pt return demonstrating use of flutter valve and IS   Shoulder Instructions      Home Living Family/patient expects to be discharged to:: Private residence Living Arrangements: Alone Available Help at Discharge: Family;Available 24 hours/day Type of Home: House Home Access: Stairs to enter [employer] of Steps: 1 Entrance Stairs-Rails: None Home Layout: One level     Bathroom Shower/Tub: Occupational psychologist: Standard Bathroom Accessibility: Yes   Home Equipment: Grab bars - toilet;Grab bars - tub/shower;Shower seat;Walker - 2 wheels;Cane - single point;Bedside commode;Wheelchair - manual   Additional Comments: Spoke with daughter [name]) who reports family will be able to provide 24/7 support (whether at pt's home or pt stays with family member); two supportive daughters nearby      Prior Functioning/Environment Level of Independence: Independent        Comments: Baseline tremor. 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 Recently d/c home from [place] on room air        OT Problem List: Decreased strength;Decreased  range of motion;Decreased activity tolerance;Impaired balance (sitting and/or standing);Decreased knowledge of precautions;Cardiopulmonary status limiting activity;Decreased knowledge of use of DME or AE      OT Treatment/Interventions: Self-care/ADL training;Therapeutic exercise;Energy conservation;DME and/or AE instruction;Therapeutic activities;Patient/family education;Balance training    OT Goals(Current goals can be found in the care plan section) Acute Rehab OT Goals Patient Stated Goal: get well and go home OT Goal Formulation: With patient Time For Goal Achievement: 03/13/19 Potential to Achieve Goals: Good  OT Frequency: Min 3X/week   Barriers to D/C:            Co-evaluation PT/OT/SLP Co-Evaluation/Treatment: Yes Reason for Co-Treatment: For patient/therapist safety(pt with poor activity tolerance)   OT goals addressed during session: ADL's and self-care      AM-PAC OT "6 Clicks" Daily Activity     Outcome Measure Help from another person eating meals?: None Help from another person taking care of personal grooming?: A Little Help from another person toileting, which includes using toliet, bedpan, or urinal?: A Lot Help from another person bathing (including washing, rinsing, drying)?: A Lot Help from another person to put on and taking off regular upper body clothing?: None Help from another person to put on and taking off regular lower body clothing?: A Lot 6 Click Score: 17   End of Session Equipment Utilized During Treatment: Oxygen Nurse Communication: Mobility status  Activity Tolerance: Patient tolerated  treatment well;Patient limited by fatigue Patient left: in chair;with call bell/phone within reach;with chair alarm set  OT Visit Diagnosis: Muscle weakness (generalized) (M62.81);Unsteadiness on feet (R26.81)                Time: [id number] OT Time Calculation (min): 23 min Charges:  OT General Charges $OT Visit: 1 Visit OT Evaluation $OT Eval Moderate  Complexity: 1 Mod  Lou Cal, OT [employer] Pager [phone] Office [phone]   Raymondo Band 02/27/2019, 1:04 PM

## 2019-02-27 NOTE — Evaluation (Signed)
Physical Therapy Evaluation Patient Details Name: Johnathan Hudson MRN: [id number] DOB: [birth year] Today's Date: 02/27/2019   History of Present Illness  Pt is an 85 y.o. male readmitted 03/06/2019 with acute hypoxic respiratory failure. Of note, two recent admissions 11/11-11/14 with sepsis and PNA, and 11/18-11/24 with COPD exacerbation and PNA, d/c home on RA. Other PMH includes OA, lumbar stenosis, HTN, afib, DM, CKD, benign essential tremor, pacemaker.    Clinical Impression  Pt presents with an overall decrease in functional mobility secondary to above. PTA, pt recently d/c home independent and on RA, lives alone but has 24/7 assist available from supportive family. Today, pt able to transfer and take steps to recliner with minA; pt with DOE 4/4 requiring prolonged seated rest break to recover after minimal activity. SpO2 down to 86% on 15L O2 HFNC. Pt with good recall of IS/flutter valve use from previous admission. Pt would benefit from continued acute PT services to maximize functional mobility and independence prior to d/c from [facility] services pending progression of respiratory status.     Follow Up Recommendations Home health PT;Supervision/Assistance - 24 hour    Equipment Recommendations  None recommended by PT    Recommendations for Other Services       Precautions / Restrictions Precautions Precautions: Fall;Other (comment) Precaution Comments: Currently 15L HFNC; tremor at rest and also with intention Restrictions Weight Bearing Restrictions: No      Mobility  Bed Mobility Overal bed mobility: Needs Assistance Bed Mobility: Supine to Sit     Supine to sit: Min assist;HOB elevated     General bed mobility comments: MinA for HHA to elevate trunk  Transfers Overall transfer level: Needs assistance Equipment used: 1 person hand held assist Transfers: Sit to/from Stand Sit to Stand: Min assist         General transfer comment: performed multiple sit<>stands with  minA for HHA to maintain balance; very SOB on 15L O2 HFNC  Ambulation/Gait Ambulation/Gait assistance: Min assist Gait Distance (Feet): 2 Feet Assistive device: 1 person hand held assist Gait Pattern/deviations: Step-to pattern;Trunk flexed     General Gait Details: Steps from bed to recliner with minA for HHA; pt needing seated rest break due to significant SOB, SpO2 down to 86% on 15 L O2 HFNC  Stairs            Wheelchair Mobility    Modified Rankin (Stroke Patients Only)       Balance Overall balance assessment: Needs assistance Sitting-balance support: Feet unsupported Sitting balance-Leahy Scale: Fair       Standing balance-Leahy Scale: Fair Standing balance comment: Can static stand without UE support, stability improved with HHA while performing posterior pericare                             Pertinent Vitals/Pain Pain Assessment: Faces Faces Pain Scale: Hurts a little bit Pain Location: "Sore and stiff all over" Pain Descriptors / Indicators: Sore Pain Intervention(s): Monitored during session;Repositioned    Home Living Family/patient expects to be discharged to:: Private residence Living Arrangements: Alone Available Help at Discharge: Family;Available 24 hours/day Type of Home: House Home Access: Stairs to enter Entrance Stairs-Rails: None Entrance Stairs-Number of Steps: 1 Home Layout: One level Home Equipment: Grab bars - toilet;Grab bars - tub/shower;Shower seat;Walker - 2 wheels;Cane - single point;Bedside commode;Wheelchair - manual Additional Comments: Spoke with daughter [name]) who reports family will be able to provide 24/7 support (whether at pt's home or  pt stays with family member); two supportive daughters nearby    Prior Function Level of Independence: Independent         Comments: Baseline tremor. 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 Recently d/c home from [place] on room air     Hand Dominance   Dominant Hand:  Right    Extremity/Trunk Assessment   Upper Extremity Assessment Upper Extremity Assessment: Generalized weakness    Lower Extremity Assessment Lower Extremity Assessment: Generalized weakness       Communication   Communication: HOH  Cognition Arousal/Alertness: Awake/alert Behavior During Therapy: WFL for tasks assessed/performed Overall Cognitive Status: Within Functional Limits for tasks assessed                                        General Comments General comments (skin integrity, edema, etc.): Good recall from previous admission of correct use for IS/flutter valve, able to demonstrate. Called daughter [name]) in room for pt to talk to on speaker phone; family to bring pt's dentures, also reports they can provide 24/7 assist as needed    Exercises     Assessment/Plan    PT Assessment Patient needs continued PT services  PT Problem List Decreased activity tolerance;Decreased mobility;Decreased coordination;Decreased safety awareness;Decreased balance;Cardiopulmonary status limiting activity;Decreased strength       PT Treatment Interventions Gait training;Functional mobility training;Therapeutic activities;Therapeutic exercise;Balance training;Patient/family education;DME instruction;Stair training    PT Goals (Current goals can be found in the Care Plan section)  Acute Rehab PT Goals Patient Stated Goal: get well and go home PT Goal Formulation: With patient/family Time For Goal Achievement: 03/13/19 Potential to Achieve Goals: Fair    Frequency Min 3X/week   Barriers to discharge        Co-evaluation               AM-PAC PT "6 Clicks" Mobility  Outcome Measure Help needed turning from your back to your side while in a flat bed without using bedrails?: A Little Help needed moving from lying on your back to sitting on the side of a flat bed without using bedrails?: A Little Help needed moving to and from a bed to a chair (including a  wheelchair)?: A Little Help needed standing up from a chair using your arms (e.g., wheelchair or bedside chair)?: A Little Help needed to walk in hospital room?: A Little Help needed climbing 3-5 steps with a railing? : A Little 6 Click Score: 18    End of Session Equipment Utilized During Treatment: Oxygen Activity Tolerance: Treatment limited secondary to medical complications (Comment) Patient left: in chair;with call bell/phone within reach;with chair alarm set Nurse Communication: Mobility status PT Visit Diagnosis: Other abnormalities of gait and mobility (R26.89);Muscle weakness (generalized) (M62.81)    Time: 1025-1050 PT Time Calculation (min) (ACUTE ONLY): 25 min   Charges:   PT Evaluation $PT Eval Moderate Complexity: [place], PT, DPT Acute Rehabilitation Services  Pager [phone] Office [place] 02/27/2019, 11:58 AM

## 2019-02-27 NOTE — Progress Notes (Addendum)
PROGRESS NOTE    [name]  TTS:[id number] DOB: [birth year] DOA: 03/09/2019 PCP: Tracie Harrier, MD   Brief Narrative:  Johnathan Hudson is a 85 y.o. male with medical history significant of COPD, T2DM, atrial fib on eliquis, depression/anxiety, HLD and multiple other medical problems with recent COVID 19 diagnosis on 11/18 with 2 prior visits to the [facility] for treatment, representing with shortness of breath.  Mr. Truss has previously been seen at the [facility] on 11/18 and 11/20.  He was most recently discharged on 11/24 after completing a course of steroids and remdesivir.  At that point in time he was improved on RA.  He was discharged and did well until 1-2 days ago when he began to have worsening SOB.  His daughter noted a fever to 100.8.  He saw his PCP who started steroids and oxygen, but he represented today to the ED with worsening SOB.  He denies chest pain or orthopnea.  He does occasionally have PND.  Denies abdominal pain, nausea, vomiting.  He denies any recent weight gain or LE swelling.  Denies smoking or etoh use.    Assessment & Plan:   Active Problems:   Type 2 diabetes mellitus with stage 3a chronic kidney disease, without long-term current use of insulin (HCC)   Atrial fibrillation, chronic (HCC)   Acute respiratory failure with hypoxia (HCC)   COVID-19 virus infection   Hypoxia   COPD (chronic obstructive pulmonary disease) (HCC)   Acute Hypoxic Respiratory Failure  COVID 19 Pneumonia s/p treatment with steroids and Remdesivir  Possible Bacterial Pneumonia: CT with bilateral airspace opacities, ground glass - consider COVID pneumonia vs pulmonary edema Fever to 100.8 on day prior to admission per daughter report O2 requirement increased overnight, now requiring 15 L [place] MRSA PCR, blood cx, sputum cx - RVP and flu swabs negative Continue broad spectrum abx for now - narrow and deescalate abx as able - follow procalcitonin, continues to  be negative, but will continue abx given critical illness and pneumonia on CT scan I/O, daily weights - BNP elevated, but lower than last admission - of note, echo from last admission with EF 60-65%, unable to eval diastolic function - trial lasix x2 doses today Daily inflammatory labs  COVID-19 Labs  Recent Labs    02/28/2019 1508 03/11/2019 1909 02/27/19 0141  DDIMER  --  0.59* 0.49  FERRITIN  --   --  483*  CRP 10.9*  --  11.1*    Lab Results  Component Value Date   SARSCOV2NAA POSITIVE (A) 02/11/2019   [place] NEGATIVE 02/04/2019   Atrial Fibrillation: currently rate controlled.  Continue propanolol with holding parameters.  Continue eliquis.  Elevated troponin: troponins flat with delta <20.  Low suspicion for ACS.  EKG appears similar to priors with afib, but new q in lead III as well as T wave inversions in V2-V6 (appears new in V2 and V3).  Will continue to monitor. Repeat EKG in AM (pending).  Type 2 Diabetes: start basal insulin.  SSI.  Follow with steroids.  Not on home meds.  COPD: continue home meds, no clear wheezing on exam  Anxiety  Depression: xanax, ativan ordered  History of Bradycardia: s/p pacemaker  CKD III: appears close to baseline  Chronic Back Pain: continue home norco  HLD: continue statin  Leukocytosis: likely 2/2 steroids, possible bacterial infection, though less likely with neg pct - follow  Thrombocytopenia: follow  DVT prophylaxis: eliquis Code Status: DNR Family  Communication: none at bedside - daughter, Mrs. Renaldo Fiddler 12/4 Disposition Plan: pending further improvemnt  Consultants:   none  Procedures:   none  Antimicrobials:  Anti-infectives (From admission, onward)   Start     Dose/Rate Route Frequency Ordered Stop   02/27/19 2000  vancomycin (VANCOCIN) IVPB 750 mg/150 ml premix     750 mg 150 mL/hr over 60 Minutes Intravenous Every 24 hours 03/02/2019 1821     02/27/19 1000  methenamine (MANDELAMINE) tablet 1,000  mg     1,000 mg Oral 2 times daily 02/25/2019 2119     02/27/19 0800  methenamine (HIPREX) tablet 1 g  Status:  Discontinued     1 g Oral 2 times daily with meals 03/07/2019 1846 03/03/2019 2011   03/24/2019 2200  ceFEPIme (MAXIPIME) 2 g in sodium chloride 0.9 % 100 mL IVPB  Status:  Discontinued     2 g 200 mL/hr over 30 Minutes Intravenous Every 12 hours 03/02/2019 1821 03/07/2019 1836   03/21/2019 2200  methenamine (MANDELAMINE) tablet 1,000 mg  Status:  Discontinued     1,000 mg Oral 2 times daily 03/10/2019 2011 03/18/2019 2119   03/25/2019 1845  ceFEPIme (MAXIPIME) 2 g in sodium chloride 0.9 % 100 mL IVPB     2 g 200 mL/hr over 30 Minutes Intravenous Every 24 hours 03/07/2019 1836     03/18/2019 1830  vancomycin (VANCOCIN) 1,500 mg in sodium chloride 0.9 % 500 mL IVPB     1,500 mg 250 mL/hr over 120 Minutes Intravenous  Once 03/02/2019 1821 03/19/2019 2043     Subjective: Feels worse than yesterday SOB, pleuritic chest pain  Objective: Vitals:   02/27/19 0441 02/27/19 0500 02/27/19 0759 02/27/19 1120  BP: 102/65  121/67 127/74  Pulse: 85  85 98  Resp: 19  20 (!) 25  Temp: (!) 97.3 F (36.3 C)  97.7 F (36.5 C) 97.9 F (36.6 C)  TempSrc: Oral  Oral Axillary  SpO2: (!) 87%  94% 96%  Weight:  70 kg      Intake/Output Summary (Last 24 hours) at 02/27/2019 1226 Last data filed at 02/27/2019 0800 Gross per 24 hour  Intake 120 ml  Output 300 ml  Net -180 ml   Filed Weights   02/27/19 0500  Weight: 70 kg    Examination:  General exam: Appears calm and comfortable  Respiratory system: distant lung sounds, slightly increased WOB, appears uncomfortable Cardiovascular system: irregular, normal rate Gastrointestinal system: Abdomen is nondistended, soft and nontender.  Central nervous system: Alert and oriented. No focal neurological deficits. Extremities: no LEE Skin: No rashes, lesions or ulcers Psychiatry: Judgement and insight appear normal. Mood & affect appropriate.     Data Reviewed: I  have personally reviewed following labs and imaging studies  CBC: Recent Labs  Lab 03/02/2019 1101 02/27/19 0141  WBC 14.5* 9.3  NEUTROABS  --  8.6*  HGB 13.0 11.4*  HCT 41.0 35.7*  MCV 92.1 91.5  PLT 136* 076*   Basic Metabolic Panel: Recent Labs  Lab 03/07/2019 1116 02/27/19 0141  NA 131* 135  K 4.0 4.6  CL 99 103  CO2 20* 21*  GLUCOSE 231* 210*  BUN 32* 36*  CREATININE 1.68* 1.59*  CALCIUM 8.9 8.6*  MG  --  2.2   GFR: Estimated Creatinine Clearance: 29.5 mL/min (A) (by C-G formula based on SCr of 1.59 mg/dL (H)). Liver Function Tests: Recent Labs  Lab 03/25/2019 1116 02/27/19 0141  AST 34 24  ALT 17 18  ALKPHOS 53 47  BILITOT 0.6 0.7  PROT 6.3* 5.7*  ALBUMIN 2.8* 2.5*   No results for input(s): LIPASE, AMYLASE in the last 168 hours. No results for input(s): AMMONIA in the last 168 hours. Coagulation Profile: No results for input(s): INR, PROTIME in the last 168 hours. Cardiac Enzymes: No results for input(s): CKTOTAL, CKMB, CKMBINDEX, TROPONINI in the last 168 hours. BNP (last 3 results) No results for input(s): PROBNP in the last 8760 hours. HbA1C: No results for input(s): HGBA1C in the last 72 hours. CBG: Recent Labs  Lab 03/06/2019 2054 02/27/19 0729  GLUCAP 230* 183*   Lipid Profile: No results for input(s): CHOL, HDL, LDLCALC, TRIG, CHOLHDL, LDLDIRECT in the last 72 hours. Thyroid Function Tests: No results for input(s): TSH, T4TOTAL, FREET4, T3FREE, THYROIDAB in the last 72 hours. Anemia Panel: Recent Labs    02/27/19 0141  FERRITIN 483*   Sepsis Labs: Recent Labs  Lab 03/03/2019 1116 02/27/19 0141  PROCALCITON <0.10 <0.10    Recent Results (from the past 240 hour(s))  Respiratory Panel by PCR     Status: None   Collection Time: 03/23/2019  3:08 PM   Specimen: Flu Kit Nasopharyngeal Swab; Respiratory  Result Value Ref Range Status   Adenovirus NOT DETECTED NOT DETECTED Final   Coronavirus 229E NOT DETECTED NOT DETECTED Final     Comment: (NOTE) The Coronavirus on the Respiratory Panel, DOES NOT test for the novel  Coronavirus (2019 nCoV)    Coronavirus HKU1 NOT DETECTED NOT DETECTED Final   Coronavirus NL63 NOT DETECTED NOT DETECTED Final   Coronavirus OC43 NOT DETECTED NOT DETECTED Final   Metapneumovirus NOT DETECTED NOT DETECTED Final   Rhinovirus / Enterovirus NOT DETECTED NOT DETECTED Final   Influenza A NOT DETECTED NOT DETECTED Final   Influenza B NOT DETECTED NOT DETECTED Final   Parainfluenza Virus 1 NOT DETECTED NOT DETECTED Final   Parainfluenza Virus 2 NOT DETECTED NOT DETECTED Final   Parainfluenza Virus 3 NOT DETECTED NOT DETECTED Final   Parainfluenza Virus 4 NOT DETECTED NOT DETECTED Final   Respiratory Syncytial Virus NOT DETECTED NOT DETECTED Final   Bordetella pertussis NOT DETECTED NOT DETECTED Final   Chlamydophila pneumoniae NOT DETECTED NOT DETECTED Final   Mycoplasma pneumoniae NOT DETECTED NOT DETECTED Final    Comment: Performed at [facility], [place]. [address], [place], [place] [address]  Culture, blood (routine x 2)     Status: None (Preliminary result)   Collection Time: 03/02/2019  6:14 PM   Specimen: BLOOD  Result Value Ref Range Status   Specimen Description   Final    BLOOD RIGHT ANTECUBITAL Performed at [facility] [address], [place], [place] [address]    Special Requests   Final    BOTTLES DRAWN AEROBIC AND ANAEROBIC Blood Culture adequate volume Performed at [facility] [address] [place], [place] [address]    Culture   Final    NO GROWTH < 12 HOURS Performed at [facility] [address] [place],  [address]    Report Status PENDING  Incomplete  MRSA PCR Screening     Status: None   Collection Time: 03/19/2019  6:15 PM   Specimen: Nasopharyngeal  Result Value Ref Range Status   MRSA by PCR NEGATIVE NEGATIVE Final    Comment:        The GeneXpert MRSA Assay (FDA approved for NASAL specimens  only), is one component of a comprehensive MRSA  colonization surveillance program. It is not intended to diagnose MRSA infection nor to guide or monitor treatment for MRSA infections. Performed at [facility], [place] [address] [place], [place] [address]   Culture, blood (routine x 2)     Status: None (Preliminary result)   Collection Time: 02/28/2019  6:19 PM   Specimen: BLOOD  Result Value Ref Range Status   Specimen Description   Final    BLOOD BLOOD RIGHT HAND Performed at [facility] [address], [place], [place] [address]    Special Requests   Final    BOTTLES DRAWN AEROBIC AND ANAEROBIC Blood Culture adequate volume Performed at [facility] [address], [place], [place] [address]    Culture   Final    NO GROWTH < 12 HOURS Performed at [facility] [address], [place], [place] [address]    Report Status PENDING  Incomplete         Radiology Studies: Ct Chest Wo Contrast  Result Date: 03/26/2019 CLINICAL DATA:  Pt's daughter reports that pt tested positive for COVID on 11/16 and he has had some difficulty breathing. Pt went to his MD today and was advised to come to the ED. Pt was put on O2 2 days ago and it was increased to 4L today. EXAM: CT CHEST WITHOUT CONTRAST TECHNIQUE: Multidetector CT imaging of the chest was performed following the standard protocol without IV contrast. COMPARISON:  03/05/2018 FINDINGS: Cardiovascular: Heart is mildly enlarged. No pericardial effusion. Three-vessel coronary artery calcifications. Aorta is normal in caliber. Mild aortic atherosclerotic calcifications. Mediastinum/Nodes: Unremarkable thyroid. No neck base or axillary masses or enlarged lymph nodes. No mediastinal or hilar masses. Prominent prevascular node measuring 9 mm in short axis. Nodes are stable when compared to the prior chest CT, none enlarged by size criteria. Trachea is unremarkable. Small to  moderate size hiatal hernia. Esophagus unremarkable. Lungs/Pleura: Extensive bilateral airspace lung opacities, which are predominantly ground-glass with small areas of more confluent opacity in a bronchovascular distribution. Lung opacities are most prominent in the upper lobes, but seen in right middle and lower lobes as well. Trace right pleural effusion. There is mild dependent atelectasis in the lower lobes, greater on the right. No pneumothorax. Upper Abdomen: No acute findings. Musculoskeletal: No fracture or acute finding.  No bone lesion. IMPRESSION: 1. Extensive bilateral airspace lung opacities, which are predominantly ground-glass. This is presumably due to COVID-19 pneumonia given the history of a positive COVID-19 test on 02/09/2019. If patient does not have symptoms of infection, however, pulmonary edema should be considered in the differential. Trace right pleural effusion. 2. No other acute abnormalities. 3. Coronary artery calcifications, mild cardiomegaly and mild aortic atherosclerotic calcifications. Aortic Atherosclerosis (ICD10-I70.0). Electronically Signed   By: Lajean Manes M.D.   On: 02/27/2019 14:24   Dg Chest Port 1 View  Result Date: 03/12/2019 CLINICAL DATA:  Shortness of breath EXAM: PORTABLE CHEST 1 VIEW COMPARISON:  02/11/2019 FINDINGS: Left-sided implanted cardiac device. Stable mild cardiomegaly. There are multifocal patchy airspace opacities throughout both lungs, new from prior. Lung volumes are low. No pleural effusion or pneumothorax. IMPRESSION: Multifocal patchy airspace opacities throughout both lungs which may reflect multifocal pneumonia versus pulmonary edema. Electronically Signed   By: Davina Poke M.D.   On: 03/18/2019 11:30        Scheduled Meds: . albuterol  2 puff Inhalation Q6H  . apixaban  2.5 mg Oral BID  . gabapentin  300 mg  Oral BID  . insulin aspart  0-15 Units Subcutaneous TID WC  . insulin aspart  0-5 Units Subcutaneous QHS  . insulin  detemir  5 Units Subcutaneous BID  . methenamine  1,000 mg Oral BID  . methylPREDNISolone (SOLU-MEDROL) injection  40 mg Intravenous Q12H  . mometasone-formoterol  2 puff Inhalation BID  . pantoprazole  40 mg Oral Daily  . polyethylene glycol  17 g Oral BID  . sertraline  50 mg Oral Daily  . simvastatin  20 mg Oral QPM  . umeclidinium bromide  1 puff Inhalation Daily   Continuous Infusions: . ceFEPime (MAXIPIME) IV 2 g (03/11/2019 1856)  . vancomycin       LOS: 1 day    Time spent: over 30 min    Fayrene Helper, MD [facility] Pager AMION  If 7PM-7AM, please contact night-coverage [email or web address] Password [email or web address] 02/27/2019, 12:26 PM

## 2019-02-27 NOTE — Progress Notes (Signed)
03/23/2019  2130 - Patient experiencing episode of distress. O2 saturations dropped into mid-80's. Patient placed on 15 [place] with intermittent use of non-rebreather mask. Oxygen saturation at low to mid 90's. Patient resting comfortably  02/27/19  0000 - Able to wean patient to 12 [place] with saturations maintaining in the mid to low 90's.  0200 - Further weaned patient to 10 [place]. O2 saturation at this time in the upper 90's while patient sleeping.  0500 - Patient O2 desaturation into the mid 80's. O2 increased up to 15 LHFNC. Patient maintaining adequately at this time.

## 2019-02-28 LAB — COMPREHENSIVE METABOLIC PANEL
ALT: 28 U/L (ref 0–44)
AST: 40 U/L (ref 15–41)
Albumin: 2.7 g/dL — ABNORMAL LOW (ref 3.5–5.0)
Alkaline Phosphatase: 61 U/L (ref 38–126)
Anion gap: 16 — ABNORMAL HIGH (ref 5–15)
BUN: 52 mg/dL — ABNORMAL HIGH (ref 8–23)
CO2: 25 mmol/L (ref 22–32)
Calcium: 9.6 mg/dL (ref 8.9–10.3)
Chloride: 98 mmol/L (ref 98–111)
Creatinine, Ser: 1.88 mg/dL — ABNORMAL HIGH (ref 0.61–1.24)
GFR calc Af Amer: 37 mL/min — ABNORMAL LOW (ref >60)
GFR calc non Af Amer: 32 mL/min — ABNORMAL LOW (ref >60)
Glucose, Bld: 128 mg/dL — ABNORMAL HIGH (ref 70–99)
Potassium: 4.1 mmol/L (ref 3.5–5.1)
Sodium: 139 mmol/L (ref 135–145)
Total Bilirubin: 0.7 mg/dL (ref 0.3–1.2)
Total Protein: 6.4 g/dL — ABNORMAL LOW (ref 6.5–8.1)

## 2019-02-28 LAB — CBC WITH DIFFERENTIAL/PLATELET
Abs Immature Granulocytes: 0.11 10*3/uL — ABNORMAL HIGH (ref 0.00–0.07)
Basophils Absolute: 0 10*3/uL (ref 0.0–0.1)
Basophils Relative: 0 %
Eosinophils Absolute: 0 10*3/uL (ref 0.0–0.5)
Eosinophils Relative: 0 %
HCT: 40.4 % (ref 39.0–52.0)
Hemoglobin: 12.9 g/dL — ABNORMAL LOW (ref 13.0–17.0)
Immature Granulocytes: 1 %
Lymphocytes Relative: 5 %
Lymphs Abs: 0.7 10*3/uL (ref 0.7–4.0)
MCH: 29.3 pg (ref 26.0–34.0)
MCHC: 31.9 g/dL (ref 30.0–36.0)
MCV: 91.8 fL (ref 80.0–100.0)
Monocytes Absolute: 0.7 10*3/uL (ref 0.1–1.0)
Monocytes Relative: 6 %
Neutro Abs: 11.2 10*3/uL — ABNORMAL HIGH (ref 1.7–7.7)
Neutrophils Relative %: 88 %
Platelets: 150 10*3/uL (ref 150–400)
RBC: 4.4 MIL/uL (ref 4.22–5.81)
RDW: 13.6 % (ref 11.5–15.5)
WBC: 12.7 10*3/uL — ABNORMAL HIGH (ref 4.0–10.5)
nRBC: 0 % (ref 0.0–0.2)

## 2019-02-28 LAB — PROCALCITONIN: Procalcitonin: 0.27 ng/mL

## 2019-02-28 LAB — GLUCOSE, CAPILLARY
Glucose-Capillary: 137 mg/dL — ABNORMAL HIGH (ref 70–99)
Glucose-Capillary: 223 mg/dL — ABNORMAL HIGH (ref 70–99)
Glucose-Capillary: 210 mg/dL — ABNORMAL HIGH (ref 70–99)
Glucose-Capillary: 154 mg/dL — ABNORMAL HIGH (ref 70–99)

## 2019-02-28 LAB — C-REACTIVE PROTEIN: CRP: 7.1 mg/dL — ABNORMAL HIGH (ref <1.0)

## 2019-02-28 LAB — BRAIN NATRIURETIC PEPTIDE: B Natriuretic Peptide: 192.3 pg/mL — ABNORMAL HIGH (ref 0.0–100.0)

## 2019-02-28 LAB — MAGNESIUM: Magnesium: 2.3 mg/dL (ref 1.7–2.4)

## 2019-02-28 LAB — FERRITIN: Ferritin: 348 ng/mL — ABNORMAL HIGH (ref 24–336)

## 2019-02-28 LAB — D-DIMER, QUANTITATIVE: D-Dimer, Quant: 0.61 ug/mL-FEU — ABNORMAL HIGH (ref 0.00–0.50)

## 2019-02-28 MED ORDER — PROPRANOLOL HCL 10 MG PO TABS
20.0000 mg | ORAL_TABLET | Freq: Three times a day (TID) | ORAL | Status: DC
  Administered 2019-02-28 – 2019-03-02 (×6): 20 mg via ORAL
  Filled 2019-02-28 (×11): qty 1

## 2019-02-28 MED ORDER — LORAZEPAM 0.5 MG PO TABS
0.5000 mg | ORAL_TABLET | Freq: Three times a day (TID) | ORAL | Status: DC | PRN
  Administered 2019-03-01 – 2019-03-02 (×2): 0.5 mg via ORAL
  Filled 2019-02-28 (×2): qty 1

## 2019-02-28 MED ORDER — LORAZEPAM 0.5 MG PO TABS: 0.5000 mg | ORAL_TABLET | Freq: Two times a day (BID) | ORAL | Status: DC

## 2019-02-28 MED ORDER — HYDROCODONE-ACETAMINOPHEN 5-325 MG PO TABS
1.0000 | ORAL_TABLET | Freq: Four times a day (QID) | ORAL | Status: DC | PRN
  Administered 2019-02-28 – 2019-03-02 (×6): 1 via ORAL
  Filled 2019-02-28 (×6): qty 1

## 2019-02-28 NOTE — Progress Notes (Addendum)
PROGRESS NOTE    Johnathan Hudson  QMG:[id number] DOB: 12-20-1933 DOA: 03/13/2019 PCP: Tracie Harrier, MD   Brief Narrative:  Johnathan Hudson is Johnathan Hudson 85 y.o. male with medical history significant of COPD, T2DM, atrial fib on eliquis, depression/anxiety, HLD and multiple other medical problems with recent COVID 19 diagnosis on 11/18 with 2 prior visits to the [facility] for treatment, representing with shortness of breath.  Johnathan Hudson has previously been seen at the [facility] on 11/18 and 11/20.  He was most recently discharged on 11/24 after completing Johnathan Hudson course of steroids and remdesivir.  At that point in time he was improved on RA.  He was discharged and did well until 1-2 days ago when he began to have worsening SOB.  His daughter noted Johnathan Hudson fever to 100.8.  He saw his PCP who started steroids and oxygen, but he represented today to the ED with worsening SOB.  He denies chest pain or orthopnea.  He does occasionally have PND.  Denies abdominal pain, nausea, vomiting.  He denies any recent weight gain or LE swelling.  Denies smoking or etoh use.    Assessment & Plan:   Active Problems:   Type 2 diabetes mellitus with stage 3a chronic kidney disease, without long-term current use of insulin (HCC)   Atrial fibrillation, chronic (HCC)   Acute respiratory failure with hypoxia (HCC)   COVID-19 virus infection   Hypoxia   COPD (chronic obstructive pulmonary disease) (HCC)   Acute Hypoxic Respiratory Failure  COVID 19 Pneumonia s/p treatment with steroids and Remdesivir  Possible Bacterial Pneumonia: CT with bilateral airspace opacities, ground glass - consider COVID pneumonia vs pulmonary edema Fever to 100.8 on day prior to admission per daughter report Currently requiring 8 L by [place], improved from yesterday MRSA PCR negative, blood cx, sputum cx - RVP and flu swabs negative Continue broad spectrum abx for now - narrow and deescalate abx as able (d/c vanc, continue cefepime) -  follow procalcitonin, continues to be negative, but will continue abx given critical illness and pneumonia on CT scan I/O, daily weights - BNP elevated, but lower than last admission - of note, echo from last admission with EF 60-65%, unable to eval diastolic function - s/p lasix x 2 12/4, will hold today based on worsening renal function Daily inflammatory labs  COVID-19 Labs  Recent Labs    03/18/2019 1508 03/13/2019 1909 02/27/19 0141 02/28/19 0136  DDIMER  --  0.59* 0.49 0.61*  FERRITIN  --   --  483* 348*  CRP 10.9*  --  11.1* 7.1*    Lab Results  Component Value Date   SARSCOV2NAA POSITIVE (Johnathan Hudson) 02/11/2019   SARSCOV2NAA NEGATIVE 02/04/2019   Atrial Fibrillation: currently rate controlled.  Continue propanolol with holding parameters.  Continue eliquis.  Elevated troponin: troponins flat with delta <20.  Low suspicion for ACS.  EKG appears similar to priors with afib, but new q in lead III as well as T wave inversions in V2-V6 (appears new in V2 and V3).  Will continue to monitor. Repeat EKG in AM (appears c/w prior).  Type 2 Diabetes: start basal insulin.  SSI.  Follow with steroids.  Not on home meds.  COPD: continue home meds, no clear wheezing on exam  Anxiety  Depression: xanax, ativan ordered  History of Bradycardia: s/p pacemaker  CKD III: appears close to baseline  Chronic Back Pain: continue home norco  HLD: continue statin  Leukocytosis: likely 2/2 steroids, possible bacterial  infection, though less likely with neg pct - follow  Thrombocytopenia: follow  Goals of Care: pt uncomfortable today and upset.  Asking about certain medications (though he couldn't tell me specifics).  He was essentially asking to go home.  I think this was driven primarily by discomfort and inability to get certain meds last night/yesterday.  Will adjust meds and discuss with daughter.  Will discuss palliative care consult, but I think if we can make him more comfortable here,  he would be amenable to stay (which at this point is reasonable as his O2 needs have improved slightly from yesterday).  Essential Tremor:  DVT prophylaxis: eliquis Code Status: DNR Family Communication: none at bedside - daughter, Johnathan Hudson 12/4 - 12/5 Disposition Plan: pending further improvemnt  Consultants:   none  Procedures:   none  Antimicrobials:  Anti-infectives (From admission, onward)   Start     Dose/Rate Route Frequency Ordered Stop   02/27/19 2000  vancomycin (VANCOCIN) IVPB 750 mg/150 ml premix  Status:  Discontinued     750 mg 150 mL/hr over 60 Minutes Intravenous Every 24 hours 03/26/2019 1821 02/28/19 0739   02/27/19 1000  methenamine (MANDELAMINE) tablet 1,000 mg     1,000 mg Oral 2 times daily 03/25/2019 2119     02/27/19 0800  methenamine (HIPREX) tablet 1 g  Status:  Discontinued     1 g Oral 2 times daily with meals 03/16/2019 1846 03/01/2019 2011   03/23/2019 2200  ceFEPIme (MAXIPIME) 2 g in sodium chloride 0.9 % 100 mL IVPB  Status:  Discontinued     2 g 200 mL/hr over 30 Minutes Intravenous Every 12 hours 03/20/2019 1821 03/14/2019 1836   03/24/2019 2200  methenamine (MANDELAMINE) tablet 1,000 mg  Status:  Discontinued     1,000 mg Oral 2 times daily 03/12/2019 2011 03/11/2019 2119   02/27/2019 1845  ceFEPIme (MAXIPIME) 2 g in sodium chloride 0.9 % 100 mL IVPB     2 g 200 mL/hr over 30 Minutes Intravenous Every 24 hours 03/09/2019 1836     02/28/2019 1830  vancomycin (VANCOCIN) 1,500 mg in sodium chloride 0.9 % 500 mL IVPB     1,500 mg 250 mL/hr over 120 Minutes Intravenous  Once 02/25/2019 1821 02/24/2019 2043     Subjective: Asking to go home Upset about yesterday  Objective: Vitals:   02/28/19 0402 02/28/19 0717 02/28/19 1131 02/28/19 1505  BP:  113/69 (!) 93/58   Pulse:  86 79   Resp:  18 20   Temp: (!) 97.3 F (36.3 C) 97.6 F (36.4 C) 97.7 F (36.5 C)   TempSrc: Oral Oral Oral   SpO2:  95% 92% 97%  Weight:        Intake/Output Summary (Last 24 hours) at  02/28/2019 1511 Last data filed at 02/28/2019 1427 Gross per 24 hour  Intake 820 ml  Output 1270 ml  Net -450 ml   Filed Weights   02/27/19 0500 02/28/19 0115  Weight: 70 kg 71.5 kg    Examination:  General: No acute distress. Cardiovascular: RRR Lungs: unlabored, on 10 L this AM Abdomen: Soft, nontender, nondistended Neurological: Alert and oriented 3. Moves all extremities 4. Cranial nerves II through XII grossly intact. Skin: Warm and dry. No rashes or lesions. Extremities: No clubbing or cyanosis. No edema.   Data Reviewed: I have personally reviewed following labs and imaging studies  CBC: Recent Labs  Lab 03/02/2019 1101 02/27/19 0141 02/28/19 0136  WBC 14.5* 9.3 12.7*  NEUTROABS  --  8.6* 11.2*  HGB 13.0 11.4* 12.9*  HCT 41.0 35.7* 40.4  MCV 92.1 91.5 91.8  PLT 136* 127* 341   Basic Metabolic Panel: Recent Labs  Lab 02/27/2019 1116 02/27/19 0141 02/28/19 0136  NA 131* 135 139  K 4.0 4.6 4.1  CL 99 103 98  CO2 20* 21* 25  GLUCOSE 231* 210* 128*  BUN 32* 36* 52*  CREATININE 1.68* 1.59* 1.88*  CALCIUM 8.9 8.6* 9.6  MG  --  2.2 2.3   GFR: Estimated Creatinine Clearance: 27 mL/min (Eular Panek) (by C-G formula based on SCr of 1.88 mg/dL (H)). Liver Function Tests: Recent Labs  Lab 03/18/2019 1116 02/27/19 0141 02/28/19 0136  AST 34 24 40  ALT 17 18 28   ALKPHOS 53 47 61  BILITOT 0.6 0.7 0.7  PROT 6.3* 5.7* 6.4*  ALBUMIN 2.8* 2.5* 2.7*   No results for input(s): LIPASE, AMYLASE in the last 168 hours. No results for input(s): AMMONIA in the last 168 hours. Coagulation Profile: No results for input(s): INR, PROTIME in the last 168 hours. Cardiac Enzymes: No results for input(s): CKTOTAL, CKMB, CKMBINDEX, TROPONINI in the last 168 hours. BNP (last 3 results) No results for input(s): PROBNP in the last 8760 hours. HbA1C: No results for input(s): HGBA1C in the last 72 hours. CBG: Recent Labs  Lab 02/27/19 0729 02/27/19 1631 02/27/19 2059 02/28/19 0715  02/28/19 1130  GLUCAP 183* 289* 303* 137* 223*   Lipid Profile: No results for input(s): CHOL, HDL, LDLCALC, TRIG, CHOLHDL, LDLDIRECT in the last 72 hours. Thyroid Function Tests: No results for input(s): TSH, T4TOTAL, FREET4, T3FREE, THYROIDAB in the last 72 hours. Anemia Panel: Recent Labs    02/27/19 0141 02/28/19 0136  FERRITIN 483* 348*   Sepsis Labs: Recent Labs  Lab 03/07/2019 1116 02/27/19 0141 02/28/19 0136  PROCALCITON <0.10 <0.10 0.27    Recent Results (from the past 240 hour(s))  Respiratory Panel by PCR     Status: None   Collection Time: 02/28/2019  3:08 PM   Specimen: Flu Kit Nasopharyngeal Swab; Respiratory  Result Value Ref Range Status   Adenovirus NOT DETECTED NOT DETECTED Final   Coronavirus 229E NOT DETECTED NOT DETECTED Final    Comment: (NOTE) The Coronavirus on the Respiratory Panel, DOES NOT test for the novel  Coronavirus (2019 nCoV)    Coronavirus HKU1 NOT DETECTED NOT DETECTED Final   Coronavirus NL63 NOT DETECTED NOT DETECTED Final   Coronavirus OC43 NOT DETECTED NOT DETECTED Final   Metapneumovirus NOT DETECTED NOT DETECTED Final   Rhinovirus / Enterovirus NOT DETECTED NOT DETECTED Final   Influenza Vernard Gram NOT DETECTED NOT DETECTED Final   Influenza B NOT DETECTED NOT DETECTED Final   Parainfluenza Virus 1 NOT DETECTED NOT DETECTED Final   Parainfluenza Virus 2 NOT DETECTED NOT DETECTED Final   Parainfluenza Virus 3 NOT DETECTED NOT DETECTED Final   Parainfluenza Virus 4 NOT DETECTED NOT DETECTED Final   Respiratory Syncytial Virus NOT DETECTED NOT DETECTED Final   Bordetella pertussis NOT DETECTED NOT DETECTED Final   Chlamydophila pneumoniae NOT DETECTED NOT DETECTED Final   Mycoplasma pneumoniae NOT DETECTED NOT DETECTED Final    Comment: Performed at [facility], [place]. [address] [place], [place] [address]  Culture, blood (routine x 2)     Status: None (Preliminary result)   Collection Time: 03/06/2019  6:14 PM   Specimen: BLOOD   Result Value Ref Range Status   Specimen Description   Final  BLOOD RIGHT ANTECUBITAL Performed at [facility] [address], [place], [place] [address]    Special Requests   Final    BOTTLES DRAWN AEROBIC AND ANAEROBIC Blood Culture adequate volume Performed at [facility] [address] [place], [place] [address]    Culture   Final    NO GROWTH 2 DAYS Performed at [facility] [address] [place], [place] [address]    Report Status PENDING  Incomplete  MRSA PCR Screening     Status: None   Collection Time: 03/01/2019  6:15 PM   Specimen: Nasopharyngeal  Result Value Ref Range Status   MRSA by PCR NEGATIVE NEGATIVE Final    Comment:        The GeneXpert MRSA Assay (FDA approved for NASAL specimens only), is one component of Tyriana Helmkamp comprehensive MRSA colonization surveillance program. It is not intended to diagnose MRSA infection nor to guide or monitor treatment for MRSA infections. Performed at [facility], [place] [address], [place], [place] [address]   Culture, blood (routine x 2)     Status: None (Preliminary result)   Collection Time: 02/25/2019  6:19 PM   Specimen: BLOOD RIGHT HAND  Result Value Ref Range Status   Specimen Description   Final    BLOOD RIGHT HAND Performed at [facility], [place] [address], [place], [place] [address]    Special Requests   Final    BOTTLES DRAWN AEROBIC AND ANAEROBIC Blood Culture adequate volume Performed at [facility] [address] [place], [place] [address]    Culture   Final    NO GROWTH 2 DAYS Performed at [facility] [address], [place], [place] [address]    Report Status PENDING  Incomplete         Radiology Studies: No results found.      Scheduled Meds: . albuterol  2 puff Inhalation Q6H  . apixaban  2.5 mg Oral BID  . gabapentin  300 mg Oral BID  . insulin aspart  0-15 Units Subcutaneous TID WC  .  insulin aspart  0-5 Units Subcutaneous QHS  . insulin detemir  5 Units Subcutaneous BID  . LORazepam  0.5 mg Oral BID  . methenamine  1,000 mg Oral BID  . methylPREDNISolone (SOLU-MEDROL) injection  40 mg Intravenous Q12H  . mometasone-formoterol  2 puff Inhalation BID  . pantoprazole  40 mg Oral Daily  . polyethylene glycol  17 g Oral BID  . propranolol  20 mg Oral TID  . sertraline  50 mg Oral Daily  . simvastatin  20 mg Oral QPM  . umeclidinium bromide  1 puff Inhalation Daily   Continuous Infusions: . ceFEPime (MAXIPIME) IV 2 g (02/27/19 1745)     LOS: 2 days    Time spent: over 30 min    Fayrene Helper, MD [facility] Pager AMION  If 7PM-7AM, please contact night-coverage [email or web address] Password [email or web address] 02/28/2019, 3:11 PM

## 2019-02-28 NOTE — TOC Initial Note (Signed)
Transition of Care [facility]) - Initial/Assessment Note    Patient Details  Name: Johnathan Hudson MRN: [id number] Date of Birth: 1934-03-04  Transition of [facility]) CM/SW Contact:    Ninfa Meeker, RN Phone Number: [phone] (working remotely) 02/28/2019, 10:00 AM  Clinical Narrative:  Patient is a 85 y.o. male with medical history significant of COPD, T2DM, atrial fib on eliquis,  and multiple other medical problems with recent COVID 19 diagnosis on 11/18 with 2 prior visits to the [facility] for treatment, Patient has previously been  at the [facility] on 11/18 and 11/20.  He was most recently discharged on 11/24. Patient readmitted for c/o shortness of breath, was on 15L HFNC, now on 10L HFNC. Case manager spoke with patient's daughter, Richardean Chimera to discuss discharge plan. She states her mom is ill with COVID 19 and receiving HH through [facility] ,they want  to use the same agency for her dad. Case Manager called referral to [facility], [facility] Liaison. Case manager will follow for further needs.    Expected Discharge Plan: [place] Barriers to Discharge: Continued Medical Work up   Patient Goals and CMS Choice     Choice offered to / list presented to : Adult Children  Expected Discharge Plan and Services Expected Discharge Plan: [place]   Discharge Planning Services: CM Consult Post Acute Care Choice: [place] arrangements for the past 2 months: [place]: PT, OT [place] Agency: [place] Date [facility] Agency Contacted: 02/28/19 Time HH Agency Contacted: [phone] Representative spoke with at [place]: Adela Lank  Prior Living Arrangements/Services Living arrangements for the past 2 months: [place] with:: Self Patient language and need for interpreter reviewed:: No Do you feel safe going back to the place where you live?: Yes       Need for Family Participation in Patient Care: Yes (Comment) Care giver support system in place?: Yes (comment)   Criminal Activity/Legal Involvement Pertinent to Current Situation/Hospitalization: No - Comment as needed  Activities of Daily Living Home Assistive Devices/Equipment: None ADL Screening (condition at time of admission) Patient's cognitive ability adequate to safely complete daily activities?: Yes Is the patient deaf or have difficulty hearing?: Yes Does the patient have difficulty seeing, even when wearing glasses/contacts?: No Does the patient have difficulty concentrating, remembering, or making decisions?: No Patient able to express need for assistance with ADLs?: Yes Does the patient have difficulty dressing or bathing?: Yes Independently performs ADLs?: No Dressing (OT): Needs assistance Is this a change from baseline?: Pre-admission baseline Grooming: Needs assistance Is this a change from baseline?: Pre-admission baseline Feeding: Independent Bathing: Needs assistance Is this a change from baseline?: Pre-admission baseline Toileting: Needs assistance Is this a change from baseline?: Pre-admission baseline In/Out Bed: Needs assistance Is this a change from baseline?: Pre-admission baseline Walks in Home: Independent Does the patient have difficulty walking or climbing stairs?: Yes Weakness of Legs: None Weakness of Arms/Hands: None  Permission Sought/Granted Permission sought to share information with : Case Manager Permission granted to share information with : Yes, Verbal Permission Granted  Share Information with NAME: Richardean Chimera -daughter  Permission granted to share info w AGENCY: [facility]        Emotional Assessment  Orientation: : Oriented to Self, Oriented to Place, Oriented to  Time, Oriented to Situation Alcohol / Substance Use: Not Applicable Psych Involvement: No (comment)  Admission diagnosis:  COIVD19  Patient Active  Problem List   Diagnosis Date Noted  . Hypoxia 03/12/2019  . COPD (chronic obstructive pulmonary disease) ([place]) 02/25/2019  . Pneumonia due to COVID-19 virus 02/13/2019  . COVID-19 virus infection 02/13/2019  . Bilateral pleural effusion 02/13/2019  . CKD (chronic kidney disease), stage III 02/13/2019  . Anemia of chronic disease 02/13/2019  . HCAP (healthcare-associated pneumonia)   . Acute respiratory failure with hypoxia ([place]) 02/11/2019  . Atrial fibrillation, chronic ([place])   . Tremor   . Anxiety and depression   . COPD with acute exacerbation ([place])   . Sepsis ([place]) 02/04/2019  . Atrial fibrillation with RVR ([place])   . Community acquired pneumonia   . Hypotension   . Type 2 diabetes mellitus with stage 3a chronic kidney disease, without long-term current use of insulin ([place])    PCP:  Tracie Harrier, MD Pharmacy:   [id number] - [place], [place] [address] Phone: [phone] Fax: [phone]  [id number] [place], [place] [address] Phone: [phone] Fax: [phone]     Social Determinants of Health (SDOH) Interventions    Readmission Risk Interventions No flowsheet data found.

## 2019-02-28 NOTE — Progress Notes (Signed)
On call provider notified r/t ideal oxygen saturations for this patient.

## 2019-03-01 ENCOUNTER — Inpatient Hospital Stay (HOSPITAL_COMMUNITY): Payer: Medicare HMO

## 2019-03-01 LAB — COMPREHENSIVE METABOLIC PANEL
ALT: 34 U/L (ref 0–44)
AST: 40 U/L (ref 15–41)
Albumin: 2.6 g/dL — ABNORMAL LOW (ref 3.5–5.0)
Alkaline Phosphatase: 60 U/L (ref 38–126)
Anion gap: 15 (ref 5–15)
BUN: 64 mg/dL — ABNORMAL HIGH (ref 8–23)
CO2: 24 mmol/L (ref 22–32)
Calcium: 9.4 mg/dL (ref 8.9–10.3)
Chloride: 98 mmol/L (ref 98–111)
Creatinine, Ser: 1.93 mg/dL — ABNORMAL HIGH (ref 0.61–1.24)
GFR calc Af Amer: 36 mL/min — ABNORMAL LOW (ref >60)
GFR calc non Af Amer: 31 mL/min — ABNORMAL LOW (ref >60)
Glucose, Bld: 183 mg/dL — ABNORMAL HIGH (ref 70–99)
Potassium: 4.5 mmol/L (ref 3.5–5.1)
Sodium: 137 mmol/L (ref 135–145)
Total Bilirubin: 0.9 mg/dL (ref 0.3–1.2)
Total Protein: 5.7 g/dL — ABNORMAL LOW (ref 6.5–8.1)

## 2019-03-01 LAB — CBC WITH DIFFERENTIAL/PLATELET
Abs Immature Granulocytes: 0.13 10*3/uL — ABNORMAL HIGH (ref 0.00–0.07)
Basophils Absolute: 0 10*3/uL (ref 0.0–0.1)
Basophils Relative: 0 %
Eosinophils Absolute: 0 10*3/uL (ref 0.0–0.5)
Eosinophils Relative: 0 %
HCT: 38.9 % — ABNORMAL LOW (ref 39.0–52.0)
Hemoglobin: 12.5 g/dL — ABNORMAL LOW (ref 13.0–17.0)
Immature Granulocytes: 1 %
Lymphocytes Relative: 5 %
Lymphs Abs: 0.6 10*3/uL — ABNORMAL LOW (ref 0.7–4.0)
MCH: 29.6 pg (ref 26.0–34.0)
MCHC: 32.1 g/dL (ref 30.0–36.0)
MCV: 92 fL (ref 80.0–100.0)
Monocytes Absolute: 0.8 10*3/uL (ref 0.1–1.0)
Monocytes Relative: 6 %
Neutro Abs: 11.9 10*3/uL — ABNORMAL HIGH (ref 1.7–7.7)
Neutrophils Relative %: 88 %
Platelets: 151 10*3/uL (ref 150–400)
RBC: 4.23 MIL/uL (ref 4.22–5.81)
RDW: 13.8 % (ref 11.5–15.5)
WBC: 13.5 10*3/uL — ABNORMAL HIGH (ref 4.0–10.5)
nRBC: 0 % (ref 0.0–0.2)

## 2019-03-01 LAB — GLUCOSE, CAPILLARY
Glucose-Capillary: 120 mg/dL — ABNORMAL HIGH (ref 70–99)
Glucose-Capillary: 183 mg/dL — ABNORMAL HIGH (ref 70–99)
Glucose-Capillary: 184 mg/dL — ABNORMAL HIGH (ref 70–99)
Glucose-Capillary: 206 mg/dL — ABNORMAL HIGH (ref 70–99)
Glucose-Capillary: 304 mg/dL — ABNORMAL HIGH (ref 70–99)

## 2019-03-01 LAB — URINALYSIS, ROUTINE W REFLEX MICROSCOPIC
Bilirubin Urine: NEGATIVE
Glucose, UA: 150 mg/dL — AB
Ketones, ur: NEGATIVE mg/dL
Leukocytes,Ua: NEGATIVE
Nitrite: NEGATIVE
Protein, ur: 30 mg/dL — AB
Specific Gravity, Urine: 1.017 (ref 1.005–1.030)
pH: 5 (ref 5.0–8.0)

## 2019-03-01 LAB — C-REACTIVE PROTEIN: CRP: 2.7 mg/dL — ABNORMAL HIGH (ref <1.0)

## 2019-03-01 LAB — MAGNESIUM: Magnesium: 2.2 mg/dL (ref 1.7–2.4)

## 2019-03-01 LAB — BRAIN NATRIURETIC PEPTIDE: B Natriuretic Peptide: 367.9 pg/mL — ABNORMAL HIGH (ref 0.0–100.0)

## 2019-03-01 LAB — D-DIMER, QUANTITATIVE: D-Dimer, Quant: 0.61 ug/mL-FEU — ABNORMAL HIGH (ref 0.00–0.50)

## 2019-03-01 LAB — FERRITIN: Ferritin: 366 ng/mL — ABNORMAL HIGH (ref 24–336)

## 2019-03-01 MED ORDER — INSULIN DETEMIR 100 UNIT/ML ~~LOC~~ SOLN
7.0000 [IU] | Freq: Two times a day (BID) | SUBCUTANEOUS | Status: DC
  Administered 2019-03-01 – 2019-03-02 (×2): 7 [IU] via SUBCUTANEOUS
  Filled 2019-03-01 (×3): qty 0.07

## 2019-03-01 MED ORDER — INSULIN ASPART 100 UNIT/ML ~~LOC~~ SOLN
3.0000 [IU] | Freq: Three times a day (TID) | SUBCUTANEOUS | Status: DC
  Administered 2019-03-02 (×2): 3 [IU] via SUBCUTANEOUS

## 2019-03-01 NOTE — Progress Notes (Signed)
Pharmacy Antibiotic Note  Johnathan Hudson is a 85 y.o. male with PMH of COPD, DM, afib, CKD III who has been seen at [facility] on 2 previous occasions. Pt was most recently discharged on 11/24 after steroids and remdesivir. He presents back to [facility] after worsening SOB. There is concern for pneumonia. Pharmacy has been consulted to dose cefepime.   Pt's WBC 121.7>>13.5 but on steroids. Currently afebrile but on HFNC at 15. Scr is worsening 1.88>>1.93 with CrCl ~ 24 ml/min.    Plan: Continue Cefepime 2g IV Q 24 hours- Today is D#4 Monitor renal function, WBC, temp, cultures, and clinical status F/U on length of therapy  Weight: 154 lb 5.2 oz (70 kg)  Temp (24hrs), Avg:97.8 F (36.6 C), Min:97.5 F (36.4 C), Max:98.1 F (36.7 C)  Recent Labs  Lab 03/04/2019 1101 02/27/2019 1116 02/27/19 0141 02/28/19 0136 03/01/19 0125  WBC 14.5*  --  9.3 12.7* 13.5*  CREATININE  --  1.68* 1.59* 1.88* 1.93*    Estimated Creatinine Clearance: 24.3 mL/min (A) (by C-G formula based on SCr of 1.93 mg/dL (H)).    Allergies  Allergen Reactions  . Sulfa Antibiotics Rash  . Lipitor [Atorvastatin] Other (See Comments)    Myalgia    Antimicrobials this admission: Vancomycin 12/03 >> 12/4 Cefepime 12/03 >>  Microbiology results: 12/03 BCx: No growth to date 12/03 MRSA PCR: negative  Thank you for allowing pharmacy to be a part of this patient's care.  Jimmy Footman, PharmD, BCPS, [place] [facility] Clinical Pharmacist Phone: [phone] 03/01/2019 8:05 AM

## 2019-03-01 NOTE — Progress Notes (Addendum)
   03/01/19 1600  Respiratory  Respiratory Pattern Regular;Labored  notified MD Florene Glen, pt breathing has become more labored. sats 89-90%. Transferred pt bed to chair sats drop to 75% but quickly recover to 89-92% without increase in 02.  Patient facetime daughter Johnathan Hudson while nurse in room.

## 2019-03-01 NOTE — Progress Notes (Addendum)
Pt called out stating he could not breathe. RN enter room sats 75% pt states he is [facility]. Increased 02 to 15L HFNC from 12L HFNC, applied NRB. Pt recovered to 98% after a few minutes. Left NRB on for aprox 10 min. Patients states he feels better. After removing NRB pt sats sustaining 89-91% RT notified during rounds.

## 2019-03-01 NOTE — Progress Notes (Signed)
Late Entry for 02/28/19 @ 2109: Spoke with wife, Lelon Frohlich. Update given on patient condition. Explained that patient continues to express concern about rehab therapy. Explained to spouse that patient has to get better in order to return back to rehab; pt must be Covid - before returning to rehab facility. Per spouse, "I have tried to explain to him, I don't know".  Explained that case management has been consulted regarding getting patient transferred by to [facility]. Spouse confirm that patient was at rehab in [place]. Spouse makes inquiry regarding if another Covid test will be obtain prior to discharge. Staff nurse explains that she is unaware of additonal Covid test have been ordered before discharge.

## 2019-03-01 NOTE — Progress Notes (Addendum)
PROGRESS NOTE    Johnathan Hudson  BTD:[id number] DOB: 09-Dec-1933 DOA: 03/25/2019 PCP: Tracie Harrier, MD   Brief Narrative:  Johnathan Hudson is Johnathan Hudson 85 y.o. male with medical history significant of COPD, T2DM, atrial fib on eliquis, depression/anxiety, HLD and multiple other medical problems with recent COVID 19 diagnosis on 11/18 with 2 prior visits to the [facility] for treatment, representing with shortness of breath.  Johnathan Hudson has previously been seen at the [facility] on 11/18 and 11/20.  He was most recently discharged on 11/24 after completing [place] Frech course of steroids and remdesivir.  At that point in time he was improved on RA.  He was discharged and did well until 1-2 days ago when he began to have worsening SOB.  His daughter noted Johnathan Hudson fever to 100.8.  He saw his PCP who started steroids and oxygen, but he represented today to the ED with worsening SOB.  He denies chest pain or orthopnea.  He does occasionally have PND.  Denies abdominal pain, nausea, vomiting.  He denies any recent weight gain or LE swelling.  Denies smoking or etoh use.    Assessment & Plan:   Active Problems:   Type 2 diabetes mellitus with stage 3a chronic kidney disease, without long-term current use of insulin (HCC)   Atrial fibrillation, chronic (HCC)   Acute respiratory failure with hypoxia (HCC)   COVID-19 virus infection   Hypoxia   COPD (chronic obstructive pulmonary disease) (HCC)   Acute Hypoxic Respiratory Failure   COVID 19 Pneumonia s/p treatment with steroids and Remdesivir   Possible Bacterial Pneumonia: CT with bilateral airspace opacities, ground glass - consider COVID pneumonia vs pulmonary edema CXR 12/6 with bilateral airspace opacities, similar to previous Fever to 100.8 on day prior to admission per daughter report O2 needs increased today - currently requiring 15 L by [place] MRSA PCR negative, blood cx, sputum cx - RVP and flu swabs negative Continue broad spectrum abx for now  - narrow and deescalate abx as able (d/c vanc, continue cefepime x 7 days) I/O, daily weights - BNP elevated, but lower than last admission - of note, echo from last admission with EF 60-65%, unable to eval diastolic function - s/p lasix x 2 12/4, creatinine worsening today, will continue to hold lasix Daily inflammatory labs  COVID-19 Labs  Recent Labs    02/27/19 0141 02/28/19 0136 03/01/19 0125  DDIMER 0.49 0.61* 0.61*  FERRITIN 483* 348* 366*  CRP 11.1* 7.1* 2.7*    Lab Results  Component Value Date   SARSCOV2NAA POSITIVE (Johnathan Hudson) 02/11/2019   [place] NEGATIVE 02/04/2019   Atrial Fibrillation: currently rate controlled.  Continue propanolol with holding parameters.  Continue eliquis.  Elevated troponin: troponins flat with delta <20.  Low suspicion for ACS.  EKG appears similar to priors with afib, but new q in lead III as well as T wave inversions in V2-V6 (appears new in V2 and V3).  Will continue to monitor. Repeat EKG in AM (appears c/w prior).  Type 2 Diabetes: start basal insulin - increase.  Start mealtime insulin.  SSI.  Follow with steroids.  Not on home meds.  COPD: continue home meds, no clear wheezing on exam  Anxiety   Depression: xanax, ativan ordered  History of Bradycardia: s/p pacemaker  CKD III: appears close to baseline  Chronic Back Pain: continue home norco  HLD: continue statin  Leukocytosis: likely 2/2 steroids, possible bacterial infection, though less likely with neg pct - follow  Thrombocytopenia: follow  AKI: baseline creatinine is 1.41.  Up to 1.9 today. Follow.  Hold lasix.  Follow UA.  Consider renal US.  Goals of Care: Will consult palliative care to assist with goals of care and symptom management.  He's critically ill at this point, worsening today.  He's uncomfortable and at times asks to go home (though I think this was Johnathan Hudson matter of discomfort), we've adjusted his home medications to assist with pain and anxiety while we're  treating his acute hypoxic respiratory failure.  At this point in time, I think it's reasonable to continue current plan of care with steroids, antibiotics and supportive care.  Discussed plan of care with daughter.   Essential Tremor:  DVT prophylaxis: eliquis Code Status: DNR Family Communication: none at bedside - daughter, Johnathan Hudson 12/6 Disposition Plan: pending further improvemnt  Consultants:   none  Procedures:   none  Antimicrobials:  Anti-infectives (From admission, onward)   Start     Dose/Rate Route Frequency Ordered Stop   02/27/19 2000  vancomycin (VANCOCIN) IVPB 750 mg/150 ml premix  Status:  Discontinued     750 mg 150 mL/hr over 60 Minutes Intravenous Every 24 hours 03/02/2019 1821 02/28/19 0739   02/27/19 1000  methenamine (MANDELAMINE) tablet 1,000 mg     1,000 mg Oral 2 times daily 03/09/2019 2119     02/27/19 0800  methenamine (HIPREX) tablet 1 g  Status:  Discontinued     1 g Oral 2 times daily with meals 03/14/2019 1846 03/11/2019 2011   03/11/2019 2200  ceFEPIme (MAXIPIME) 2 g in sodium chloride 0.9 % 100 mL IVPB  Status:  Discontinued     2 g 200 mL/hr over 30 Minutes Intravenous Every 12 hours 03/24/2019 1821 03/14/2019 1836   03/26/2019 2200  methenamine (MANDELAMINE) tablet 1,000 mg  Status:  Discontinued     1,000 mg Oral 2 times daily 02/27/2019 2011 03/14/2019 2119   03/19/2019 1845  ceFEPIme (MAXIPIME) 2 g in sodium chloride 0.9 % 100 mL IVPB     2 g 200 mL/hr over 30 Minutes Intravenous Every 24 hours 02/28/2019 1836     03/04/2019 1830  vancomycin (VANCOCIN) 1,500 mg in sodium chloride 0.9 % 500 mL IVPB     1,500 mg 250 mL/hr over 120 Minutes Intravenous  Once 03/15/2019 1821 03/10/2019 2043     Subjective: Feels worse today  Objective: Vitals:   03/01/19 1200 03/01/19 1600 03/01/19 1606 03/01/19 1623  BP: 106/70 107/79 116/79   Pulse: 66 87 76   Resp: 20 (!) 24    Temp: 98.4 F (36.9 C) 99.1 F (37.3 C)    TempSrc: Oral Oral    SpO2: 95% (!) 89%  93%    Weight:        Intake/Output Summary (Last 24 hours) at 03/01/2019 1632 Last data filed at 02/28/2019 2000 Gross per 24 hour  Intake 113.48 ml  Output 375 ml  Net -261.52 ml   Filed Weights   02/27/19 0500 02/28/19 0115 03/01/19 0430  Weight: 70 kg 71.5 kg 70 kg    Examination:  General: Feels generally uncomfortable Cardiovascular: RRR Lungs: increased WOB, on 15 L high flow, coarse breath sounds Abdomen: Soft, nontender, nondistended  Neurological: Alert and oriented 3. Moves all extremities 4 . Cranial nerves II through XII grossly intact. Skin: Warm and dry. No rashes or lesions. Extremities: No clubbing or cyanosis. No edema.  Data Reviewed: I have personally reviewed following labs and imaging studies  CBC:  Recent Labs  Lab 03/07/2019 1101 02/27/19 0141 02/28/19 0136 03/01/19 0125  WBC 14.5* 9.3 12.7* 13.5*  NEUTROABS  --  8.6* 11.2* 11.9*  HGB 13.0 11.4* 12.9* 12.5*  HCT 41.0 35.7* 40.4 38.9*  MCV 92.1 91.5 91.8 92.0  PLT 136* 127* 150 016   Basic Metabolic Panel: Recent Labs  Lab 03/14/2019 1116 02/27/19 0141 02/28/19 0136 03/01/19 0125  NA 131* 135 139 137  K 4.0 4.6 4.1 4.5  CL 99 103 98 98  CO2 20* 21* 25 24  GLUCOSE 231* 210* 128* 183*  BUN 32* 36* 52* 64*  CREATININE 1.68* 1.59* 1.88* 1.93*  CALCIUM 8.9 8.6* 9.6 9.4  MG  --  2.2 2.3 2.2   GFR: Estimated Creatinine Clearance: 24.3 mL/min (Avrum Kimball) (by C-G formula based on SCr of 1.93 mg/dL (H)). Liver Function Tests: Recent Labs  Lab 02/24/2019 1116 02/27/19 0141 02/28/19 0136 03/01/19 0125  AST 34 24 40 40  ALT 17 18 28  34  ALKPHOS 53 47 61 60  BILITOT 0.6 0.7 0.7 0.9  PROT 6.3* 5.7* 6.4* 5.7*  ALBUMIN 2.8* 2.5* 2.7* 2.6*   No results for input(s): LIPASE, AMYLASE in the last 168 hours. No results for input(s): AMMONIA in the last 168 hours. Coagulation Profile: No results for input(s): INR, PROTIME in the last 168 hours. Cardiac Enzymes: No results for input(s): CKTOTAL, CKMB,  CKMBINDEX, TROPONINI in the last 168 hours. BNP (last 3 results) No results for input(s): PROBNP in the last 8760 hours. HbA1C: No results for input(s): HGBA1C in the last 72 hours. CBG: Recent Labs  Lab 02/28/19 1528 02/28/19 2116 03/01/19 0011 03/01/19 0758 03/01/19 1119  GLUCAP 210* 154* 183* 206* 304*   Lipid Profile: No results for input(s): CHOL, HDL, LDLCALC, TRIG, CHOLHDL, LDLDIRECT in the last 72 hours. Thyroid Function Tests: No results for input(s): TSH, T4TOTAL, FREET4, T3FREE, THYROIDAB in the last 72 hours. Anemia Panel: Recent Labs    02/28/19 0136 03/01/19 0125  FERRITIN 348* 366*   Sepsis Labs: Recent Labs  Lab 03/16/2019 1116 02/27/19 0141 02/28/19 0136  PROCALCITON <0.10 <0.10 0.27    Recent Results (from the past 240 hour(s))  Respiratory Panel by PCR     Status: None   Collection Time: 02/24/2019  3:08 PM   Specimen: Flu Kit Nasopharyngeal Swab; Respiratory  Result Value Ref Range Status   Adenovirus NOT DETECTED NOT DETECTED Final   Coronavirus 229E NOT DETECTED NOT DETECTED Final    Comment: (NOTE) The Coronavirus on the Respiratory Panel, DOES NOT test for the novel  Coronavirus (2019 nCoV)    Coronavirus HKU1 NOT DETECTED NOT DETECTED Final   Coronavirus NL63 NOT DETECTED NOT DETECTED Final   Coronavirus OC43 NOT DETECTED NOT DETECTED Final   Metapneumovirus NOT DETECTED NOT DETECTED Final   Rhinovirus / Enterovirus NOT DETECTED NOT DETECTED Final   Influenza Maevyn Riordan NOT DETECTED NOT DETECTED Final   Influenza B NOT DETECTED NOT DETECTED Final   Parainfluenza Virus 1 NOT DETECTED NOT DETECTED Final   Parainfluenza Virus 2 NOT DETECTED NOT DETECTED Final   Parainfluenza Virus 3 NOT DETECTED NOT DETECTED Final   Parainfluenza Virus 4 NOT DETECTED NOT DETECTED Final   Respiratory Syncytial Virus NOT DETECTED NOT DETECTED Final   Bordetella pertussis NOT DETECTED NOT DETECTED Final   Chlamydophila pneumoniae NOT DETECTED NOT DETECTED Final    Mycoplasma pneumoniae NOT DETECTED NOT DETECTED Final    Comment: Performed at [facility], [place]. [place],  [place] [address]  Culture, blood (routine x 2)     Status: None (Preliminary result)   Collection Time: 02/25/2019  6:14 PM   Specimen: BLOOD  Result Value Ref Range Status   Specimen Description   Final    BLOOD RIGHT ANTECUBITAL Performed at [facility] [address], [place], [place] [address]    Special Requests   Final    BOTTLES DRAWN AEROBIC AND ANAEROBIC Blood Culture adequate volume Performed at [facility] [address] [place], [place] [address]    Culture   Final    NO GROWTH 3 DAYS Performed at [facility], [place] [address] [place], [place] [address]    Report Status PENDING  Incomplete  MRSA PCR Screening     Status: None   Collection Time: 03/17/2019  6:15 PM   Specimen: Nasopharyngeal  Result Value Ref Range Status   MRSA by PCR NEGATIVE NEGATIVE Final    Comment:        The GeneXpert MRSA Assay (FDA approved for NASAL specimens only), is one component of Aubriee Szeto comprehensive MRSA colonization surveillance program. It is not intended to diagnose MRSA infection nor to guide or monitor treatment for MRSA infections. Performed at [facility], [place] [address] [place], [place] [address]   Culture, blood (routine x 2)     Status: None (Preliminary result)   Collection Time: 03/24/2019  6:19 PM   Specimen: BLOOD RIGHT HAND  Result Value Ref Range Status   Specimen Description   Final    BLOOD RIGHT HAND Performed at [facility], [place] [address] [place], [place] [address]    Special Requests   Final    BOTTLES DRAWN AEROBIC AND ANAEROBIC Blood Culture adequate volume Performed at [facility] [address], [place], [place] [address]    Culture   Final    NO GROWTH 3 DAYS Performed at [facility], [place] [address], [place], [place] [address]      Report Status PENDING  Incomplete         Radiology Studies: Dg Chest Port 1 View  Result Date: 03/01/2019 CLINICAL DATA:  Hypoxia. EXAM: PORTABLE CHEST 1 VIEW COMPARISON:  02/27/2019 FINDINGS: Left chest wall pacer device is noted with leads in the right atrial appendage and right ventricle. Normal heart size. Aortic atherosclerosis. Bilateral upper and lower lung zone interstitial and airspace opacities are again noted. Not significantly changed from previous exam. IMPRESSION: No change in aeration to the lungs compared with previous exam. Electronically Signed   By: Kerby Moors M.D.   On: 03/01/2019 15:19        Scheduled Meds:  albuterol  2 puff Inhalation Q6H   apixaban  2.5 mg Oral BID   gabapentin  300 mg Oral BID   insulin aspart  0-15 Units Subcutaneous TID WC   insulin aspart  0-5 Units Subcutaneous QHS   insulin detemir  5 Units Subcutaneous BID   methenamine  1,000 mg Oral BID   methylPREDNISolone (SOLU-MEDROL) injection  40 mg Intravenous Q12H   mometasone-formoterol  2 puff Inhalation BID   pantoprazole  40 mg Oral Daily   polyethylene glycol  17 g Oral BID   propranolol  20 mg Oral TID   sertraline  50 mg Oral Daily   simvastatin  20 mg Oral QPM   umeclidinium bromide  1 puff Inhalation Daily   Continuous Infusions:  ceFEPime (MAXIPIME) IV 200  mL/hr at 02/28/19 1800     LOS: 3 days    Time spent: over 30 min    Fayrene Helper, MD [facility] Pager AMION  If 7PM-7AM, please contact night-coverage [email or web address] Password [email or web address] 03/01/2019, 4:32 PM

## 2019-03-02 ENCOUNTER — Encounter (HOSPITAL_COMMUNITY): Payer: Self-pay

## 2019-03-02 ENCOUNTER — Inpatient Hospital Stay (HOSPITAL_COMMUNITY): Payer: Medicare HMO

## 2019-03-02 DIAGNOSIS — U071 COVID-19: Principal | ICD-10-CM

## 2019-03-02 DIAGNOSIS — I482 Chronic atrial fibrillation, unspecified: Secondary | ICD-10-CM

## 2019-03-02 DIAGNOSIS — Z515 Encounter for palliative care: Secondary | ICD-10-CM

## 2019-03-02 DIAGNOSIS — J449 Chronic obstructive pulmonary disease, unspecified: Secondary | ICD-10-CM

## 2019-03-02 DIAGNOSIS — J9601 Acute respiratory failure with hypoxia: Secondary | ICD-10-CM

## 2019-03-02 LAB — COMPREHENSIVE METABOLIC PANEL
ALT: 29 U/L (ref 0–44)
AST: 25 U/L (ref 15–41)
Albumin: 2.9 g/dL — ABNORMAL LOW (ref 3.5–5.0)
Alkaline Phosphatase: 65 U/L (ref 38–126)
Anion gap: 13 (ref 5–15)
BUN: 71 mg/dL — ABNORMAL HIGH (ref 8–23)
CO2: 23 mmol/L (ref 22–32)
Calcium: 9.4 mg/dL (ref 8.9–10.3)
Chloride: 99 mmol/L (ref 98–111)
Creatinine, Ser: 1.85 mg/dL — ABNORMAL HIGH (ref 0.61–1.24)
GFR calc Af Amer: 38 mL/min — ABNORMAL LOW (ref >60)
GFR calc non Af Amer: 33 mL/min — ABNORMAL LOW (ref >60)
Glucose, Bld: 191 mg/dL — ABNORMAL HIGH (ref 70–99)
Potassium: 4.7 mmol/L (ref 3.5–5.1)
Sodium: 135 mmol/L (ref 135–145)
Total Bilirubin: 1.3 mg/dL — ABNORMAL HIGH (ref 0.3–1.2)
Total Protein: 6.4 g/dL — ABNORMAL LOW (ref 6.5–8.1)

## 2019-03-02 LAB — CBC WITH DIFFERENTIAL/PLATELET
Abs Immature Granulocytes: 0.31 10*3/uL — ABNORMAL HIGH (ref 0.00–0.07)
Basophils Absolute: 0 10*3/uL (ref 0.0–0.1)
Basophils Relative: 0 %
Eosinophils Absolute: 0 10*3/uL (ref 0.0–0.5)
Eosinophils Relative: 0 %
HCT: 41.3 % (ref 39.0–52.0)
Hemoglobin: 13.4 g/dL (ref 13.0–17.0)
Immature Granulocytes: 2 %
Lymphocytes Relative: 3 %
Lymphs Abs: 0.5 10*3/uL — ABNORMAL LOW (ref 0.7–4.0)
MCH: 29.6 pg (ref 26.0–34.0)
MCHC: 32.4 g/dL (ref 30.0–36.0)
MCV: 91.4 fL (ref 80.0–100.0)
Monocytes Absolute: 0.6 10*3/uL (ref 0.1–1.0)
Monocytes Relative: 4 %
Neutro Abs: 13.6 10*3/uL — ABNORMAL HIGH (ref 1.7–7.7)
Neutrophils Relative %: 91 %
Platelets: 170 10*3/uL (ref 150–400)
RBC: 4.52 MIL/uL (ref 4.22–5.81)
RDW: 13.5 % (ref 11.5–15.5)
WBC: 15 10*3/uL — ABNORMAL HIGH (ref 4.0–10.5)
nRBC: 0 % (ref 0.0–0.2)

## 2019-03-02 LAB — C-REACTIVE PROTEIN: CRP: 3 mg/dL — ABNORMAL HIGH (ref <1.0)

## 2019-03-02 LAB — BRAIN NATRIURETIC PEPTIDE: B Natriuretic Peptide: 373.1 pg/mL — ABNORMAL HIGH (ref 0.0–100.0)

## 2019-03-02 LAB — GLUCOSE, CAPILLARY
Glucose-Capillary: 311 mg/dL — ABNORMAL HIGH (ref 70–99)
Glucose-Capillary: 192 mg/dL — ABNORMAL HIGH (ref 70–99)
Glucose-Capillary: 189 mg/dL — ABNORMAL HIGH (ref 70–99)
Glucose-Capillary: 254 mg/dL — ABNORMAL HIGH (ref 70–99)

## 2019-03-02 LAB — D-DIMER, QUANTITATIVE: D-Dimer, Quant: 1.17 ug/mL-FEU — ABNORMAL HIGH (ref 0.00–0.50)

## 2019-03-02 LAB — MAGNESIUM: Magnesium: 2.3 mg/dL (ref 1.7–2.4)

## 2019-03-02 LAB — FERRITIN: Ferritin: 283 ng/mL (ref 24–336)

## 2019-03-02 MED ORDER — FUROSEMIDE 10 MG/ML IJ SOLN
40.0000 mg | Freq: Once | INTRAMUSCULAR | Status: AC
  Administered 2019-03-02: 12:00:00 40 mg via INTRAVENOUS
  Filled 2019-03-02: qty 4

## 2019-03-02 MED ORDER — MORPHINE SULFATE (PF) 2 MG/ML IV SOLN
1.0000 mg | INTRAVENOUS | Status: DC | PRN
  Administered 2019-03-02: 1 mg via INTRAVENOUS
  Filled 2019-03-02: qty 1

## 2019-03-02 MED ORDER — GLYCOPYRROLATE 1 MG PO TABS
1.0000 mg | ORAL_TABLET | ORAL | Status: DC | PRN
  Filled 2019-03-02: qty 1

## 2019-03-02 MED ORDER — HALOPERIDOL 0.5 MG PO TABS
0.5000 mg | ORAL_TABLET | ORAL | Status: DC | PRN
  Filled 2019-03-02: qty 1

## 2019-03-02 MED ORDER — MORPHINE SULFATE (PF) 2 MG/ML IV SOLN
1.0000 mg | INTRAVENOUS | Status: DC | PRN
  Administered 2019-03-02 – 2019-03-03 (×5): 2 mg via INTRAVENOUS
  Filled 2019-03-02 (×5): qty 1

## 2019-03-02 MED ORDER — POLYVINYL ALCOHOL 1.4 % OP SOLN
1.0000 [drp] | Freq: Four times a day (QID) | OPHTHALMIC | Status: DC | PRN
  Filled 2019-03-02: qty 15

## 2019-03-02 MED ORDER — HALOPERIDOL LACTATE 5 MG/ML IJ SOLN: 0.5000 mg | INTRAMUSCULAR | Status: DC | PRN

## 2019-03-02 MED ORDER — GLYCOPYRROLATE 0.2 MG/ML IJ SOLN
0.2000 mg | INTRAMUSCULAR | Status: DC | PRN
  Filled 2019-03-02: qty 1

## 2019-03-02 MED ORDER — BIOTENE DRY MOUTH MT LIQD: 15.0000 mL | OROMUCOSAL | Status: DC | PRN

## 2019-03-02 MED ORDER — LORAZEPAM 0.5 MG PO TABS: 1.0000 mg | ORAL_TABLET | ORAL | Status: DC | PRN

## 2019-03-02 MED ORDER — LORAZEPAM 2 MG/ML IJ SOLN
1.0000 mg | INTRAMUSCULAR | Status: DC | PRN
  Administered 2019-03-03 – 2019-03-05 (×2): 1 mg via INTRAVENOUS
  Filled 2019-03-02 (×2): qty 1

## 2019-03-02 MED ORDER — ONDANSETRON 4 MG PO TBDP: 4.0000 mg | ORAL_TABLET | Freq: Four times a day (QID) | ORAL | Status: DC | PRN

## 2019-03-02 MED ORDER — LORAZEPAM 2 MG/ML PO CONC: 1.0000 mg | ORAL | Status: DC | PRN

## 2019-03-02 MED ORDER — HALOPERIDOL LACTATE 2 MG/ML PO CONC
0.5000 mg | ORAL | Status: DC | PRN
  Filled 2019-03-02: qty 0.3

## 2019-03-02 MED ORDER — ACETAMINOPHEN 650 MG RE SUPP: 650.0000 mg | Freq: Four times a day (QID) | RECTAL | Status: DC | PRN

## 2019-03-02 MED ORDER — ONDANSETRON HCL 4 MG/2ML IJ SOLN: 4.0000 mg | Freq: Four times a day (QID) | INTRAMUSCULAR | Status: DC | PRN

## 2019-03-02 MED ORDER — ACETAMINOPHEN 325 MG PO TABS: 650.0000 mg | ORAL_TABLET | Freq: Four times a day (QID) | ORAL | Status: DC | PRN

## 2019-03-02 NOTE — Care Management Important Message (Signed)
Important Message  Patient Details  Name: Johnathan Hudson MRN: [id number] Date of Birth: 1933-04-07   Medicare Important Message Given:  Yes - Important Message mailed due to current National Emergency  Verbal consent obtained due to current National Emergency  Relationship to patient: Child Contact Name: Loi Alworth Call Date: 03/02/19  Time: [id number] Phone: [phone] Outcome: No Answer/Busy Important Message mailed to: Patient address on file    Delorse Lek 03/02/2019, 1:47 PM

## 2019-03-02 NOTE — Consult Note (Signed)
   [facility] Inpatient Consult   03/02/2019  Johnathan Hudson 1933-11-12 [id number]    [facility] Active status:   Patient has been currently active with [place] [facility] services. Patient is currently engaged by [facility] coordinator for transitions of care, prior to readmission.  This patient will receive a post hospital call and will be evaluated for assessments and disease process educationif transitioning to home.  Patient wasreviewed for 30 day readmission with 3 hospitalizations and 1 ED visit in the past 6 months and for 30% extreme high risk score for unplanned readmission, as a benefit from hisHumanaMedicare plan.  Chart reviewed andMD brief narrative notereveals as: Johnathan Hudson an 85 y.o.malewith medical history significant ofCOPD, T2DM, atrial fib on eliquis, depression/anxiety, HLD and multiple other medical problems with recent COVID 19 diagnosis on 11/18 with 2 prior visits to the [facility] for treatment, representing with shortness of breath.    Johnathan Hudson has previously been seen at the [facility] on 11/18 and 11/20. He was most recently discharged on 11/24 after completing a course of steroids and remdesivir. He saw his PCP who started steroids and oxygen due to recent worsening shortness of breath and fever,    but he represented to the ED with worsening SOB, currently requiring 15 L by [place]   Noted that Palliative care was consulted and will be looking into inpatient hospice as patient had voiced desire for comfort care and hospice to MD   Primary care provider is Dr.Vishwanath Hande with [facility]. Marland Kitchen Plan:Will follow for dispositionwith inpatient TOC team.  If transitioning with hospice, [facility] will not be following since patient will have full case management services through Hospice.   Willnotify [facility] RN CMof discharge disposition.   For additional questions or referrals,please contact:  Johnathan Hudson, BSN, RN-BC  [facility] Liaison Cell: [phone]

## 2019-03-02 NOTE — Consult Note (Signed)
Consultation Note Date: 03/02/2019   Patient Name: Johnathan Hudson  DOB: 1933/11/21  MRN: [id number]  Age / Sex: 85 y.o., male  PCP: Tracie Harrier, MD Referring Physician: Elodia Florence., *  Reason for Consultation: Establishing goals of care and Terminal Care  HPI/Patient Profile: 85 y.o. male  with past medical history of COPD, DM type 2, atrial fib on eliquis, depression, anxiety, HLD, cardiomyopathy, renal insufficiency, arthritis admitted on 03/18/2019 with shortness of breath. Patient recently hospitalized at [facility] for covid-19 infection discharged on 11/24 after completing course of steroids and remdesivir. Poor prognosis due to acute hypoxic respiratory failure requiring 15L NRB mask secondary to COVID 19 pneumonia/possible bacterial pneumonia. Palliative medicine consultation for goals of care/EOL care.  Clinical Assessment and Goals of Care: PMT consult received and chart reviewed. Discussed with Dr. Florene Glen and RN this morning. Dr. Florene Glen to speak with patient at bedside with daughter on Facetime. After this conversation, received update from Dr. Florene Glen about plan for transition to comfort measures and possible hospice home referral.   Spoke with RN again. Patient oxygen saturations dropped to low 80's off of NRB while attempting to eat this afternoon.    Spoke with daughter, [name] via telephone to discuss plan of care. [name] shares her conversation with Dr. Florene Glen and her father on [place]. [name] confirms that her father no longer wishes to receive antibiotics or steroids, and is ready to transition to comfort measures. He wishes to "rest comfortably." He has failed to show improvement despite aggressive medical management.   Discussed concern that he is likely too unstable for transfer to hospice facility with high oxygen requirements and likelihood that his condition will  decline quickly once transitioned off of NRB mask. [name] confirms understanding of poor prognosis and most importantly, her wish for her father to be comfortable at EOL. She would rather leave him in the hospital (avoid hospice transfer) as long as her and siblings can visit and as long as he is comfortable at EOL. She shares that he has been very uncomfortable since re-admission to the hospital.   [name], her sister, and her brother plan to visit [name] this afternoon around 3pm.   Educated on transition to comfort measures and focus on symptom management medications to ensure comfort. Explained that NRB can be left on until he is comfortable and if/when he is ready to transition to nasal cannula. I did explain that NRB could prolong this process for him. [name] understands and shares that as long as he is comfortable, she does not want this prolonged and he would not wish for the process to be prolonged.   Emotional support provided. Answered questions about plan of care. Encouraged [name] to call my desk number after visiting her father if questions or concerns.    SUMMARY OF RECOMMENDATIONS    Attending spoke with patient and daughter this afternoon. Patient does not wish to continue aggressive medical management. He wishes to transition to comfort measures only, understanding poor prognosis.  Symptom management--below.  RN to allow family visit this afternoon.  Once patient comfortable and ready, RN can start weaning off of NRB to nasal cannula. Anticipate he will decline quickly, as oxygen saturations dropped to low 80's this afternoon when NRB off briefly during lunch.   Anticipate he will pass inpatient, but PMT will stay involved and further discuss hospice facility with family if appropriate.   Code Status/Advance Care Planning:  DNR  Symptom Management:   Morphine 1-2mg  IV q2h prn pain/dyspnea/air hunger tachypnea  Ativan 1mg  IV q4h prn anxiety  Robinul 0.2mg  IV q4h prn  secretions  Consider morphine infusion if patient remains uncomfortable despite Morphine IV push doses.   Palliative Prophylaxis:   Aspiration, Delirium Protocol, Frequent Pain Assessment, Oral Care and Turn Reposition  Additional Recommendations (Limitations, Scope, Preferences):  Full Comfort Care  Psycho-social/Spiritual:   Desire for further Chaplaincy support: yes  Additional Recommendations: Caregiving  Support/Resources, Compassionate Wean Education and Education on Hospice  Prognosis:   Poor prognosis  Discharge Planning: Anticipated Hospital Death      Primary Diagnoses: Present on Admission:  Hypoxia  Atrial fibrillation, chronic ([place])  Type 2 diabetes mellitus with stage 3a chronic kidney disease, without long-term current use of insulin ([place])  Acute respiratory failure with hypoxia ([place])   I have reviewed the medical record, interviewed the patient and family, and examined the patient. The following aspects are pertinent.  Past Medical History:  Diagnosis Date   Anxiety    Arthritis    rheumatoid   Benign essential tremor    Cardiomyopathy (HCC)    mild   Chronic kidney disease    kidney stones   COPD (chronic obstructive pulmonary disease) (HCC)    Diabetes mellitus without complication (HCC)    Dysrhythmia    atrial fib   Episodic atrial fibrillation (HCC)    Hyperlipemia    Hypertension    Lumbar stenosis with neurogenic claudication    Osteoarthritis    Psoriasis    Renal insufficiency    Social History   Socioeconomic History   Marital status: Widowed    Spouse name: Not on file   Number of children: Not on file   Years of education: Not on file   Highest education level: Not on file  Occupational History   Not on file  Social Needs   Financial resource strain: Not on file   Food insecurity    Worry: Never true    Inability: Never true   Transportation needs    Medical: No    Non-medical: No   Tobacco Use   Smoking status: Former Smoker   Smokeless tobacco: Current User    Types: Chew  Substance and Sexual Activity   Alcohol use: No   Drug use: No   Sexual activity: Not on file  Lifestyle   Physical activity    Days per week: Not on file    Minutes per session: Not on file   Stress: Not on file  Relationships   Social connections    Talks on phone: Not on file    Gets together: Not on file    Attends religious service: Not on file    Active member of club or organization: Not on file    Attends meetings of clubs or organizations: Not on file    Relationship status: Not on file  Other Topics Concern   Not on file  Social History Narrative   Not on file   Family History  Problem Relation Age of  Onset   Cancer Mother    Alzheimer's disease Father    Scheduled Meds:  albuterol  2 puff Inhalation Q6H   apixaban  2.5 mg Oral BID   gabapentin  300 mg Oral BID   insulin aspart  0-15 Units Subcutaneous TID WC   insulin aspart  0-5 Units Subcutaneous QHS   insulin aspart  3 Units Subcutaneous TID WC   insulin detemir  7 Units Subcutaneous BID   methenamine  1,000 mg Oral BID   mometasone-formoterol  2 puff Inhalation BID   pantoprazole  40 mg Oral Daily   polyethylene glycol  17 g Oral BID   propranolol  20 mg Oral TID   sertraline  50 mg Oral Daily   simvastatin  20 mg Oral QPM   umeclidinium bromide  1 puff Inhalation Daily   Continuous Infusions: PRN Meds:.acetaminophen **OR** acetaminophen, albuterol, ALPRAZolam, antiseptic oral rinse, glycopyrrolate **OR** glycopyrrolate **OR** glycopyrrolate, haloperidol **OR** haloperidol **OR** haloperidol lactate, HYDROcodone-acetaminophen, LORazepam **OR** LORazepam **OR** LORazepam, morphine injection, ondansetron **OR** ondansetron (ZOFRAN) IV, polyvinyl alcohol Medications Prior to Admission:  Prior to Admission medications   Medication Sig Start Date End Date Taking? Authorizing Provider   acetaminophen (TYLENOL) 500 MG tablet Take 1,000 mg 3 (three) times daily as needed by mouth for moderate pain.    Yes [provider]  albuterol (VENTOLIN HFA) 108 (90 Base) MCG/ACT inhaler Inhale 2 puffs into the lungs every 6 (six) hours as needed for wheezing or shortness of breath. 02/07/19  Yes Wieting, Richard, MD  ALPRAZolam Duanne Moron) 0.25 MG tablet Take 0.25 mg by mouth at bedtime as needed for sleep. 01/27/19  Yes [provider]  apixaban (ELIQUIS) 2.5 MG TABS tablet Take 2.5 mg by mouth 2 (two) times daily.   Yes [provider]  budesonide-formoterol (SYMBICORT) 80-4.5 MCG/ACT inhaler Inhale 2 puffs into the lungs 2 (two) times daily. 02/07/19  Yes Wieting, Richard, MD  gabapentin (NEURONTIN) 300 MG capsule Take 300 mg by mouth 2 (two) times daily.    Yes [provider]  HYDROcodone-acetaminophen (NORCO) 7.5-325 MG tablet Take 1 tablet by mouth 3 (three) times daily as needed for pain. 01/08/19  Yes [provider]  ipratropium-albuterol (DUONEB) 0.5-2.5 (3) MG/3ML SOLN Take 3 mLs by nebulization 2 (two) times daily.   Yes [provider]  levofloxacin (LEVAQUIN) 500 MG tablet Take 500 mg by mouth daily.   Yes [provider]  LORazepam (ATIVAN) 0.5 MG tablet Take 1 tablet (0.5 mg total) by mouth 2 (two) times daily. 02/07/19  Yes Wieting, Richard, MD  methenamine (HIPREX) 1 g tablet Take 1 g by mouth 2 (two) times daily with a meal.   Yes [provider]  omeprazole (PRILOSEC) 20 MG capsule Take 20 mg by mouth 2 (two) times daily before a meal.   Yes [provider]  predniSONE (DELTASONE) 10 MG tablet Take 30 mg by mouth daily with breakfast.   Yes [provider]  sertraline (ZOLOFT) 50 MG tablet Take 50 mg by mouth daily.   Yes [provider]  simvastatin (ZOCOR) 20 MG tablet Take 20 mg by mouth every evening.    Yes [provider]  tiotropium (SPIRIVA HANDIHALER) 18 MCG  inhalation capsule Place 1 capsule (18 mcg total) into inhaler and inhale daily. 02/07/19 02/07/20 Yes Wieting, Richard, MD  vitamin C (VITAMIN C) 500 MG tablet Take 1 tablet (500 mg total) by mouth daily. 02/12/19  Yes Lavina Hamman, MD  zinc sulfate  220 (50 Zn) MG capsule Take 1 capsule (220 mg total) by mouth daily. 02/12/19  Yes Lavina Hamman, MD   Allergies  Allergen Reactions   Sulfa Antibiotics Rash   Lipitor [Atorvastatin] Other (See Comments)    Myalgia   Review of Systems  Physical Exam  Vital Signs: BP 123/71 (BP Location: Left Arm)    Pulse 76    Temp 98.3 F (36.8 C) (Axillary)    Resp 18    Wt 70 kg    SpO2 93%    BMI 26.49 kg/m  Pain Scale: 0-10 POSS *See Group Information*: 1-Acceptable,Awake and alert Pain Score: 0-No pain   SpO2: SpO2: 93 % O2 Device:SpO2: 93 % O2 Flow Rate: .O2 Flow Rate (L/min): 15 L/min  IO: Intake/output summary:   Intake/Output Summary (Last 24 hours) at 03/02/2019 1409 Last data filed at 03/02/2019 1236 Gross per 24 hour  Intake 556.51 ml  Output 800 ml  Net -243.49 ml    LBM: Last BM Date: 03/01/19 Baseline Weight: Weight: 70 kg Most recent weight: Weight: 70 kg     Palliative Assessment/Data:  PPS 20%     Time In: 1325 Time Out: 1425 Time Total: 60 Greater than 50%  of this time was spent counseling and coordinating care related to the above assessment and plan.  The above conversation was completed via telephone due to visitor restrictions during COVID-19 pandemic. Thorough chart review and discussion with multidisciplinary team was completed as part of assessment. No physical examination was performed.   Signed by:  Ihor Dow, DNP, FNP-C Palliative Medicine Team  Phone: [phone] Fax: [phone]   Please contact Palliative Medicine Team phone at [phone] for questions and concerns.  For individual provider: See Shea Evans

## 2019-03-02 NOTE — Progress Notes (Signed)
PROGRESS NOTE    Johnathan Hudson  QMG:[id number] DOB: 26-Apr-1933 DOA: 03/13/2019 PCP: Tracie Harrier, MD   Brief Narrative:  Johnathan Hudson is Johnathan Hudson 85 y.o. male with medical history significant of COPD, T2DM, atrial fib on eliquis, depression/anxiety, HLD and multiple other medical problems with recent COVID 19 diagnosis on 11/18 with 2 prior visits to the [facility] for treatment, representing with shortness of breath.  Johnathan Hudson has previously been seen at the [facility] on 11/18 and 11/20.  He was most recently discharged on 11/24 after completing Deyton Ellenbecker course of steroids and remdesivir.  At that point in time he was improved on RA.  He was discharged and did well until 1-2 days ago when he began to have worsening SOB.  His daughter noted Johnathan Hudson fever to 100.8.  He saw his PCP who started steroids and oxygen, but he represented today to the ED with worsening SOB.  He denies chest pain or orthopnea.  He does occasionally have PND.  Denies abdominal pain, nausea, vomiting.  He denies any recent weight gain or LE swelling.  Denies smoking or etoh use.    Assessment & Plan:   Active Problems:   Type 2 diabetes mellitus with stage 3a chronic kidney disease, without long-term current use of insulin (HCC)   Atrial fibrillation, chronic (HCC)   Acute respiratory failure with hypoxia (HCC)   COVID-19 virus infection   Hypoxia   COPD (chronic obstructive pulmonary disease) ([place])  Goals of Care:  Patient tells me today that he wants to go home with hospice.  He's noted that he has wanted to go home at times over the past few days and we've adjusted his home meds to try to help with comfort while he's been admitted in the hospital.  Today he mentioned that he'd just wanted to go home and wanted to go with hospice.  He's had no meaningful improvement over the past few days with steroids and antibiotics and is still critically ill with AHRF requiring Legrande Hao nonrebreather.  Johnathan Hudson demonstrates  capacity to make medical decisions and based on Johnathan Hudson wishes, hospice and Crystalina Stodghill focus on comfort is reasonable decision with his lack of meaningful improvement.  Discussed that we'd stop antibiotics and steroids and focus on his comfort.  Will place referral to inpatient hospice.  Discussed on phone with his daughter, Johnathan Hudson the plan of care and her father's desire for hospice.  We facetimed her father together to discuss plan of care -> d/c abx, steroids, plan for inpatient hospice, comfort care.  Discussed with nursing to try to see if we can assist family with visit of Johnathan Hudson while we await hospice.  Case was discussed with palliative care, appreciate their assistance, they're going to look into inpatient hospice and discuss with daughter to see if any questions.  Acute Hypoxic Respiratory Failure   COVID 19 Pneumonia s/p treatment with steroids and Remdesivir   Possible Bacterial Pneumonia: CT with bilateral airspace opacities, ground glass - consider COVID pneumonia vs pulmonary edema CXR 12/6 with bilateral airspace opacities, similar to previous Fever to 100.8 on day prior to admission per daughter report 12/7 CXR with stable to mildly progressed bilateral lung opacity O2 needs increased today - persistently increased O2 needs today, on NRB MRSA PCR negative, blood cx, sputum cx - RVP and flu swabs negative Continue broad spectrum abx for now - narrow and deescalate abx as able -will d/c abx and steroids with focus on  comfort I/O, daily weights - BNP elevated, but lower than last admission - of note, echo from last admission with EF 60-65%, unable to eval diastolic function - s/p lasix x 2 12/4 Daily inflammatory labs  COVID-19 Labs  Recent Labs    02/28/19 0136 03/01/19 0125 03/02/19 0219  DDIMER 0.61* 0.61* 1.17*  FERRITIN 348* 366* 283  CRP 7.1* 2.7* 3.0*    Lab Results  Component Value Date   SARSCOV2NAA POSITIVE (Johnathan Hudson) 02/11/2019   SARSCOV2NAA NEGATIVE 02/04/2019    Atrial Fibrillation: currently rate controlled.  Continue propanolol with holding parameters.  Continue eliquis.  Elevated troponin: troponins flat with delta <20.  Low suspicion for ACS.  EKG appears similar to priors with afib, but new q in lead III as well as T wave inversions in V2-V6 (appears new in V2 and V3).  Will continue to monitor. Repeat EKG in AM (appears c/w prior).  Type 2 Diabetes: start basal insulin - increase.  Start mealtime insulin.  SSI.  Follow with steroids.  Not on home meds.  COPD: continue home meds, no clear wheezing on exam  Anxiety   Depression: xanax, ativan ordered  History of Bradycardia: s/p pacemaker  CKD III: appears close to baseline  Chronic Back Pain: continue home norco  HLD: continue statin  Leukocytosis: likely 2/2 steroids, possible bacterial infection, though less likely with neg pct - follow  Thrombocytopenia: follow  AKI: baseline creatinine is 1.41.  Slightly improved to 18.5 today. Follow.  Hold lasix.  Follow UA.  Consider renal US.  Essential Tremor:  DVT prophylaxis: eliquis Code Status: DNR Family Communication: none at bedside - daughter, Johnathan Hudson 12/6 Disposition Plan: pending further improvemnt  Consultants:   none  Procedures:   none  Antimicrobials:  Anti-infectives (From admission, onward)   Start     Dose/Rate Route Frequency Ordered Stop   02/27/19 2000  vancomycin (VANCOCIN) IVPB 750 mg/150 ml premix  Status:  Discontinued     750 mg 150 mL/hr over 60 Minutes Intravenous Every 24 hours 03/18/2019 1821 02/28/19 0739   02/27/19 1000  methenamine (MANDELAMINE) tablet 1,000 mg     1,000 mg Oral 2 times daily 02/28/2019 2119     02/27/19 0800  methenamine (HIPREX) tablet 1 g  Status:  Discontinued     1 g Oral 2 times daily with meals 03/02/2019 1846 03/07/2019 2011   03/11/2019 2200  ceFEPIme (MAXIPIME) 2 g in sodium chloride 0.9 % 100 mL IVPB  Status:  Discontinued     2 g 200 mL/hr over 30 Minutes  Intravenous Every 12 hours 03/04/2019 1821 03/26/2019 1836   03/03/2019 2200  methenamine (MANDELAMINE) tablet 1,000 mg  Status:  Discontinued     1,000 mg Oral 2 times daily 02/24/2019 2011 03/13/2019 2119   02/25/2019 1845  ceFEPIme (MAXIPIME) 2 g in sodium chloride 0.9 % 100 mL IVPB  Status:  Discontinued     2 g 200 mL/hr over 30 Minutes Intravenous Every 24 hours 02/24/2019 1836 03/02/19 1334   03/01/2019 1830  vancomycin (VANCOCIN) 1,500 mg in sodium chloride 0.9 % 500 mL IVPB     1,500 mg 250 mL/hr over 120 Minutes Intravenous  Once 03/02/2019 1821 02/28/2019 2043     Subjective: Asks to go home again today States to me that he wants hospice He wants to focus on comfort   Objective: Vitals:   03/02/19 0743 03/02/19 0800 03/02/19 0847 03/02/19 1200  BP:  113/71 113/71 123/71  Pulse:  67 79  76  Resp:  19  18  Temp:  98.1 F (36.7 C)  98.3 F (36.8 C)  TempSrc:  Oral  Axillary  SpO2: 98% 96%  93%  Weight:        Intake/Output Summary (Last 24 hours) at 03/02/2019 1335 Last data filed at 03/02/2019 1236 Gross per 24 hour  Intake 556.51 ml  Output 800 ml  Net -243.49 ml   Filed Weights   02/27/19 0500 02/28/19 0115 03/01/19 0430  Weight: 70 kg 71.5 kg 70 kg    Examination:  General: appears uncomfortable  Cardiovascular: RRR Lungs: increased WOB on NRB  Abdomen: Soft, nontender, nondistended  Neurological: Alert and oriented 3. Moves all extremities 4 . Cranial nerves II through XII grossly intact. Skin: Warm and dry. No rashes or lesions. Extremities: No clubbing or cyanosis. No edema.   Data Reviewed: I have personally reviewed following labs and imaging studies  CBC: Recent Labs  Lab 03/06/2019 1101 02/27/19 0141 02/28/19 0136 03/01/19 0125 03/02/19 0219  WBC 14.5* 9.3 12.7* 13.5* 15.0*  NEUTROABS  --  8.6* 11.2* 11.9* 13.6*  HGB 13.0 11.4* 12.9* 12.5* 13.4  HCT 41.0 35.7* 40.4 38.9* 41.3  MCV 92.1 91.5 91.8 92.0 91.4  PLT 136* 127* 150 151 253   Basic Metabolic  Panel: Recent Labs  Lab 02/25/2019 1116 02/27/19 0141 02/28/19 0136 03/01/19 0125 03/02/19 0219  NA 131* 135 139 137 135  K 4.0 4.6 4.1 4.5 4.7  CL 99 103 98 98 99  CO2 20* 21* 25 24 23   GLUCOSE 231* 210* 128* 183* 191*  BUN 32* 36* 52* 64* 71*  CREATININE 1.68* 1.59* 1.88* 1.93* 1.85*  CALCIUM 8.9 8.6* 9.6 9.4 9.4  MG  --  2.2 2.3 2.2 2.3   GFR: Estimated Creatinine Clearance: 25.3 mL/min (Johnathan Hudson) (by C-G formula based on SCr of 1.85 mg/dL (H)). Liver Function Tests: Recent Labs  Lab 02/25/2019 1116 02/27/19 0141 02/28/19 0136 03/01/19 0125 03/02/19 0219  AST 34 24 40 40 25  ALT 17 18 28  34 29  ALKPHOS 53 47 61 60 65  BILITOT 0.6 0.7 0.7 0.9 1.3*  PROT 6.3* 5.7* 6.4* 5.7* 6.4*  ALBUMIN 2.8* 2.5* 2.7* 2.6* 2.9*   No results for input(s): LIPASE, AMYLASE in the last 168 hours. No results for input(s): AMMONIA in the last 168 hours. Coagulation Profile: No results for input(s): INR, PROTIME in the last 168 hours. Cardiac Enzymes: No results for input(s): CKTOTAL, CKMB, CKMBINDEX, TROPONINI in the last 168 hours. BNP (last 3 results) No results for input(s): PROBNP in the last 8760 hours. HbA1C: No results for input(s): HGBA1C in the last 72 hours. CBG: Recent Labs  Lab 03/01/19 1119 03/01/19 1602 03/01/19 2321 03/02/19 0747 03/02/19 1206  GLUCAP 304* 120* 184* 189* 254*   Lipid Profile: No results for input(s): CHOL, HDL, LDLCALC, TRIG, CHOLHDL, LDLDIRECT in the last 72 hours. Thyroid Function Tests: No results for input(s): TSH, T4TOTAL, FREET4, T3FREE, THYROIDAB in the last 72 hours. Anemia Panel: Recent Labs    03/01/19 0125 03/02/19 0219  FERRITIN 366* 283   Sepsis Labs: Recent Labs  Lab 03/01/2019 1116 02/27/19 0141 02/28/19 0136  PROCALCITON <0.10 <0.10 0.27    Recent Results (from the past 240 hour(s))  Respiratory Panel by PCR     Status: None   Collection Time: 02/24/2019  3:08 PM   Specimen: Flu Kit Nasopharyngeal Swab; Respiratory  Result  Value Ref Range Status   Adenovirus NOT DETECTED NOT DETECTED Final  Coronavirus 229E NOT DETECTED NOT DETECTED Final    Comment: (NOTE) The Coronavirus on the Respiratory Panel, DOES NOT test for the novel  Coronavirus (2019 nCoV)    Coronavirus HKU1 NOT DETECTED NOT DETECTED Final   Coronavirus NL63 NOT DETECTED NOT DETECTED Final   Coronavirus OC43 NOT DETECTED NOT DETECTED Final   Metapneumovirus NOT DETECTED NOT DETECTED Final   Rhinovirus / Enterovirus NOT DETECTED NOT DETECTED Final   Influenza Johnathan Hudson NOT DETECTED NOT DETECTED Final   Influenza B NOT DETECTED NOT DETECTED Final   Parainfluenza Virus 1 NOT DETECTED NOT DETECTED Final   Parainfluenza Virus 2 NOT DETECTED NOT DETECTED Final   Parainfluenza Virus 3 NOT DETECTED NOT DETECTED Final   Parainfluenza Virus 4 NOT DETECTED NOT DETECTED Final   Respiratory Syncytial Virus NOT DETECTED NOT DETECTED Final   Bordetella pertussis NOT DETECTED NOT DETECTED Final   Chlamydophila pneumoniae NOT DETECTED NOT DETECTED Final   Mycoplasma pneumoniae NOT DETECTED NOT DETECTED Final    Comment: Performed at [facility], [place] [address], [place], [place] [address]  Culture, blood (routine x 2)     Status: None (Preliminary result)   Collection Time: 03/17/2019  6:14 PM   Specimen: BLOOD  Result Value Ref Range Status   Specimen Description   Final    BLOOD RIGHT ANTECUBITAL Performed at [place] [address], [place], [place] [address]    Special Requests   Final    BOTTLES DRAWN AEROBIC AND ANAEROBIC Blood Culture adequate volume Performed at [place] [address], [place], [place] [address]    Culture   Final    NO GROWTH 3 DAYS Performed at [facility], [place] [address] [place], [place] [address]    Report Status PENDING  Incomplete  MRSA PCR Screening     Status: None   Collection Time: 03/08/2019  6:15 PM   Specimen: Nasopharyngeal  Result Value Ref Range Status    MRSA by PCR NEGATIVE NEGATIVE Final    Comment:        The GeneXpert MRSA Assay (FDA approved for NASAL specimens only), is one component of Francheska Villeda comprehensive MRSA colonization surveillance program. It is not intended to diagnose MRSA infection nor to guide or monitor treatment for MRSA infections. Performed at [facility], [place] [address] [place], [place] [address]   Culture, blood (routine x 2)     Status: None (Preliminary result)   Collection Time: 02/24/2019  6:19 PM   Specimen: BLOOD RIGHT HAND  Result Value Ref Range Status   Specimen Description   Final    BLOOD RIGHT HAND Performed at [facility], [place] [address], [place], [place] [address]    Special Requests   Final    BOTTLES DRAWN AEROBIC AND ANAEROBIC Blood Culture adequate volume Performed at [place] [address], [place], [place] [address]    Culture   Final    NO GROWTH 3 DAYS Performed at [facility], [place] [address] [place],  [address]    Report Status PENDING  Incomplete         Radiology Studies: Dg Chest Port 1 View  Result Date: 03/02/2019 CLINICAL DATA:  85 year old male with COVID-19. Shortness of breath. EXAM: PORTABLE CHEST 1 VIEW COMPARISON:  03/01/2019 portable chest and earlier. FINDINGS: Portable AP semi upright view at 0914 hours. Stable lung volumes and mediastinal contours. Stable left chest pacemaker. Visualized  tracheal air column is within normal limits. Similar gas within the thoracic esophagus also. Continued bilateral widespread interstitial and indistinct pulmonary opacity. Ventilation appears mildly decreased in both lungs from yesterday. These opacities are new since 02/11/2019. No superimposed pneumothorax. No pleural effusion is evident. Negative visible bowel gas pattern. No acute osseous abnormality identified. IMPRESSION: 1. Stable to mildly progressed widespread bilateral lung opacity since yesterday compatible with  COVID-19 pneumonia. 2. No new cardiopulmonary abnormality. Electronically Signed   By: Genevie Ann M.D.   On: 03/02/2019 09:26   Dg Chest Port 1 View  Result Date: 03/01/2019 CLINICAL DATA:  Hypoxia. EXAM: PORTABLE CHEST 1 VIEW COMPARISON:  03/15/2019 FINDINGS: Left chest wall pacer device is noted with leads in the right atrial appendage and right ventricle. Normal heart size. Aortic atherosclerosis. Bilateral upper and lower lung zone interstitial and airspace opacities are again noted. Not significantly changed from previous exam. IMPRESSION: No change in aeration to the lungs compared with previous exam. Electronically Signed   By: Kerby Moors M.D.   On: 03/01/2019 15:19        Scheduled Meds:  albuterol  2 puff Inhalation Q6H   apixaban  2.5 mg Oral BID   gabapentin  300 mg Oral BID   insulin aspart  0-15 Units Subcutaneous TID WC   insulin aspart  0-5 Units Subcutaneous QHS   insulin aspart  3 Units Subcutaneous TID WC   insulin detemir  7 Units Subcutaneous BID   methenamine  1,000 mg Oral BID   mometasone-formoterol  2 puff Inhalation BID   pantoprazole  40 mg Oral Daily   polyethylene glycol  17 g Oral BID   propranolol  20 mg Oral TID   sertraline  50 mg Oral Daily   simvastatin  20 mg Oral QPM   umeclidinium bromide  1 puff Inhalation Daily   Continuous Infusions:    LOS: 4 days    Time spent: over 30 min    Fayrene Helper, MD [facility] Pager AMION  If 7PM-7AM, please contact night-coverage [email or web address] Password [email or web address] 03/02/2019, 1:35 PM

## 2019-03-03 DIAGNOSIS — R0902 Hypoxemia: Secondary | ICD-10-CM

## 2019-03-03 DIAGNOSIS — F411 Generalized anxiety disorder: Secondary | ICD-10-CM | POA: Diagnosis present

## 2019-03-03 DIAGNOSIS — R0600 Dyspnea, unspecified: Secondary | ICD-10-CM | POA: Diagnosis present

## 2019-03-03 DIAGNOSIS — R0682 Tachypnea, not elsewhere classified: Secondary | ICD-10-CM | POA: Diagnosis present

## 2019-03-03 LAB — CULTURE, BLOOD (ROUTINE X 2)
Culture: NO GROWTH
Culture: NO GROWTH
Special Requests: ADEQUATE
Special Requests: ADEQUATE

## 2019-03-03 MED ORDER — MORPHINE BOLUS VIA INFUSION
2.0000 mg | INTRAVENOUS | Status: DC | PRN
  Administered 2019-03-03 (×2): 4 mg via INTRAVENOUS
  Filled 2019-03-03: qty 4

## 2019-03-03 MED ORDER — MORPHINE 100MG IN NS 100ML (1MG/ML) PREMIX INFUSION
1.0000 mg/h | INTRAVENOUS | Status: DC
  Administered 2019-03-03: 15:00:00 3 mg/h via INTRAVENOUS
  Administered 2019-03-03: 12:00:00 2 mg/h via INTRAVENOUS
  Administered 2019-03-04: 07:00:00 3 mg/h via INTRAVENOUS
  Filled 2019-03-03 (×2): qty 100

## 2019-03-03 NOTE — Progress Notes (Signed)
PROGRESS NOTE    Johnathan Hudson  GHW:[id number] DOB: Jun 05, 1933 DOA: 03/03/2019 PCP: Tracie Harrier, MD   Brief Narrative:  Johnathan Hudson is Johnathan Hudson 85 y.o. male with medical history significant of COPD, T2DM, atrial fib on eliquis, depression/anxiety, HLD and multiple other medical problems with recent COVID 19 diagnosis on 11/18 with 2 prior visits to the [facility] for treatment, representing with shortness of breath.  Mr. Varady has previously been seen at the [facility] on 11/18 and 11/20.  He was most recently discharged on 11/24 after completing Johnathan Hudson course of steroids and remdesivir.  At that point in time he was improved on RA.  He was discharged and did well until 1-2 days ago when he began to have worsening SOB.  His daughter noted Johnathan Hudson fever to 100.8.  He saw his PCP who started steroids and oxygen, but he represented today to the ED with worsening SOB.  He denies chest pain or orthopnea.  He does occasionally have PND.  Denies abdominal pain, nausea, vomiting.  He denies any recent weight gain or LE swelling.  Denies smoking or etoh use.    Assessment & Plan:   Active Problems:   Type 2 diabetes mellitus with stage 3a chronic kidney disease, without long-term current use of insulin (HCC)   Atrial fibrillation, chronic (HCC)   Acute respiratory failure with hypoxia (HCC)   COVID-19 virus infection   Hypoxia   COPD (chronic obstructive pulmonary disease) ([place])   Palliative care by specialist   Terminal care   Dyspnea   Tachypnea   Anxiety state  Goals of Care:  Comfort measures only at this point.  Appreciate palliative care assistance.  Starting morphine gtt for comfort this morning.  Acute Hypoxic Respiratory Failure  COVID 19 Pneumonia s/p treatment with steroids and Remdesivir  Possible Bacterial Pneumonia: CT with bilateral airspace opacities, ground glass - consider COVID pneumonia vs pulmonary edema CXR 12/6 with bilateral airspace opacities, similar to  previous Fever to 100.8 on day prior to admission per daughter report 12/7 CXR with stable to mildly progressed bilateral lung opacity O2 needs increased today - persistently increased O2 needs today, on NRB MRSA PCR negative, blood cx, sputum cx - RVP and flu swabs negative Continue broad spectrum abx for now - narrow and deescalate abx as able -will d/c abx and steroids with focus on comfort I/O, daily weights - BNP elevated, but lower than last admission - of note, echo from last admission with EF 60-65%, unable to eval diastolic function - s/p lasix x 2 12/4 Daily inflammatory labs Currently comfort measures only  COVID-19 Labs  Recent Labs    03/01/19 0125 03/02/19 0219  DDIMER 0.61* 1.17*  FERRITIN 366* 283  CRP 2.7* 3.0*    Lab Results  Component Value Date   SARSCOV2NAA POSITIVE (Johnathan Hudson) 02/11/2019   [place] NEGATIVE 02/04/2019   Atrial Fibrillation: currently rate controlled.  Continue propanolol with holding parameters.  Continue eliquis. CMO.   Elevated troponin: troponins flat with delta <20.  Low suspicion for ACS.  EKG appears similar to priors with afib, but new q in lead III as well as T wave inversions in V2-V6 (appears new in V2 and V3).  Will continue to monitor. Repeat EKG in AM (appears c/w prior).  Type 2 Diabetes: start basal insulin - increase.  Start mealtime insulin.  SSI.  Follow with steroids.  Not on home meds.  COPD: continue home meds, no clear wheezing on exam  Anxiety  Depression: xanax, ativan ordered  History of Bradycardia: s/p pacemaker  CKD III: appears close to baseline  Chronic Back Pain: continue home norco  HLD: continue statin  Leukocytosis: likely 2/2 steroids, possible bacterial infection, though less likely with neg pct - follow  Thrombocytopenia: follow  AKI: baseline creatinine is 1.41.  Slightly improved to 18.5 today. Follow.  Hold lasix.  Follow UA.  Consider renal US.  Essential Tremor:  DVT prophylaxis:  eliquis Code Status: DNR Family Communication: none at bedside - daughter, Johnathan Hudson 12/6 Disposition Plan: pending further improvemnt  Consultants:   none  Procedures:   none  Antimicrobials:  Anti-infectives (From admission, onward)   Start     Dose/Rate Route Frequency Ordered Stop   02/27/19 2000  vancomycin (VANCOCIN) IVPB 750 mg/150 ml premix  Status:  Discontinued     750 mg 150 mL/hr over 60 Minutes Intravenous Every 24 hours 03/17/2019 1821 02/28/19 0739   02/27/19 1000  methenamine (MANDELAMINE) tablet 1,000 mg  Status:  Discontinued     1,000 mg Oral 2 times daily 03/19/2019 2119 03/03/19 0755   02/27/19 0800  methenamine (HIPREX) tablet 1 g  Status:  Discontinued     1 g Oral 2 times daily with meals 03/04/2019 1846 03/23/2019 2011   02/24/2019 2200  ceFEPIme (MAXIPIME) 2 g in sodium chloride 0.9 % 100 mL IVPB  Status:  Discontinued     2 g 200 mL/hr over 30 Minutes Intravenous Every 12 hours 03/24/2019 1821 03/15/2019 1836   03/16/2019 2200  methenamine (MANDELAMINE) tablet 1,000 mg  Status:  Discontinued     1,000 mg Oral 2 times daily 03/19/2019 2011 02/27/2019 2119   03/13/2019 1845  ceFEPIme (MAXIPIME) 2 g in sodium chloride 0.9 % 100 mL IVPB  Status:  Discontinued     2 g 200 mL/hr over 30 Minutes Intravenous Every 24 hours 02/28/2019 1836 03/02/19 1334   03/19/2019 1830  vancomycin (VANCOCIN) 1,500 mg in sodium chloride 0.9 % 500 mL IVPB     1,500 mg 250 mL/hr over 120 Minutes Intravenous  Once 03/24/2019 1821 03/01/2019 2043     Subjective: Seen at bedside C/o chest pain - I discussed I'd get nurse to help with pain.  Objective: Vitals:   03/02/19 1900 03/02/19 1944 03/03/19 0748 03/03/19 0806  BP:   93/68 93/68  Pulse:  74 91 (!) 114  Resp:   (!) 22 (!) 24  Temp:  (!) 97.5 F (36.4 C) 98.7 F (37.1 C)   TempSrc:  Oral Axillary   SpO2:  96% (!) 88% (!) 71%  Weight:      Height: _0  (1.651 m)       Intake/Output Summary (Last 24 hours) at 03/03/2019 1248 Last data  filed at 03/03/2019 1200 Gross per 24 hour  Intake 304.47 ml  Output 1250 ml  Net -945.53 ml   Filed Weights   02/27/19 0500 02/28/19 0115 03/01/19 0430  Weight: 70 kg 71.5 kg 70 kg    Examination:  General: mild discomfort Lungs: increased WOB Neurological: Alert, but Fantashia Shupert bit lethargic. Moves all extremities 4. Cranial nerves II through XII grossly intact. Skin: Warm and dry. No rashes or lesions. Extremities: No clubbing or cyanosis. No edema.    Data Reviewed: I have personally reviewed following labs and imaging studies  CBC: Recent Labs  Lab 03/04/2019 1101 02/27/19 0141 02/28/19 0136 03/01/19 0125 03/02/19 0219  WBC 14.5* 9.3 12.7* 13.5* 15.0*  NEUTROABS  --  8.6*  11.2* 11.9* 13.6*  HGB 13.0 11.4* 12.9* 12.5* 13.4  HCT 41.0 35.7* 40.4 38.9* 41.3  MCV 92.1 91.5 91.8 92.0 91.4  PLT 136* 127* 150 151 161   Basic Metabolic Panel: Recent Labs  Lab 02/27/2019 1116 02/27/19 0141 02/28/19 0136 03/01/19 0125 03/02/19 0219  NA 131* 135 139 137 135  K 4.0 4.6 4.1 4.5 4.7  CL 99 103 98 98 99  CO2 20* 21* _0 GLUCOSE 231* 210* 128* 183* 191*  BUN 32* 36* 52* 64* 71*  CREATININE 1.68* 1.59* 1.88* 1.93* 1.85*  CALCIUM 8.9 8.6* 9.6 9.4 9.4  MG  --  2.2 2.3 2.2 2.3   GFR: Estimated Creatinine Clearance: 26.3 mL/min ([place]) (by C-G formula based on SCr of 1.85 mg/dL (H)). Liver Function Tests: Recent Labs  Lab 03/09/2019 1116 02/27/19 0141 02/28/19 0136 03/01/19 0125 03/02/19 0219  AST 34 24 40 40 25  ALT _1 34 29  ALKPHOS 53 47 61 60 65  BILITOT 0.6 0.7 0.7 0.9 1.3*  PROT 6.3* 5.7* 6.4* 5.7* 6.4*  ALBUMIN 2.8* 2.5* 2.7* 2.6* 2.9*   No results for input(s): LIPASE, AMYLASE in the last 168 hours. No results for input(s): AMMONIA in the last 168 hours. Coagulation Profile: No results for input(s): INR, PROTIME in the last 168 hours. Cardiac Enzymes: No results for input(s): CKTOTAL, CKMB, CKMBINDEX, TROPONINI in the last 168 hours. BNP (last 3 results)  No results for input(s): PROBNP in the last 8760 hours. HbA1C: No results for input(s): HGBA1C in the last 72 hours. CBG: Recent Labs  Lab 03/01/19 1119 03/01/19 1602 03/01/19 2321 03/02/19 0747 03/02/19 1206  GLUCAP 304* 120* 184* 189* 254*   Lipid Profile: No results for input(s): CHOL, HDL, LDLCALC, TRIG, CHOLHDL, LDLDIRECT in the last 72 hours. Thyroid Function Tests: No results for input(s): TSH, T4TOTAL, FREET4, T3FREE, THYROIDAB in the last 72 hours. Anemia Panel: Recent Labs    03/01/19 0125 03/02/19 0219  FERRITIN 366* 283   Sepsis Labs: Recent Labs  Lab 03/08/2019 1116 02/27/19 0141 02/28/19 0136  PROCALCITON <0.10 <0.10 0.27    Recent Results (from the past 240 hour(s))  Respiratory Panel by PCR     Status: None   Collection Time: 02/27/2019  3:08 PM   Specimen: Flu Kit Nasopharyngeal Swab; Respiratory  Result Value Ref Range Status   Adenovirus NOT DETECTED NOT DETECTED Final   Coronavirus 229E NOT DETECTED NOT DETECTED Final    Comment: (NOTE) The Coronavirus on the Respiratory Panel, DOES NOT test for the novel  Coronavirus (2019 nCoV)    Coronavirus HKU1 NOT DETECTED NOT DETECTED Final   Coronavirus NL63 NOT DETECTED NOT DETECTED Final   Coronavirus OC43 NOT DETECTED NOT DETECTED Final   Metapneumovirus NOT DETECTED NOT DETECTED Final   Rhinovirus / Enterovirus NOT DETECTED NOT DETECTED Final   Influenza Vijay Durflinger NOT DETECTED NOT DETECTED Final   Influenza B NOT DETECTED NOT DETECTED Final   Parainfluenza Virus 1 NOT DETECTED NOT DETECTED Final   Parainfluenza Virus 2 NOT DETECTED NOT DETECTED Final   Parainfluenza Virus 3 NOT DETECTED NOT DETECTED Final   Parainfluenza Virus 4 NOT DETECTED NOT DETECTED Final   Respiratory Syncytial Virus NOT DETECTED NOT DETECTED Final   Bordetella pertussis NOT DETECTED NOT DETECTED Final   Chlamydophila pneumoniae NOT DETECTED NOT DETECTED Final   Mycoplasma pneumoniae NOT DETECTED NOT DETECTED Final    Comment:  Performed at [facility], [place]. [place],  [place] [address]  Culture, blood (routine x 2)     Status: None   Collection Time: 03/11/2019  6:14 PM   Specimen: BLOOD  Result Value Ref Range Status   Specimen Description   Final    BLOOD RIGHT ANTECUBITAL Performed at [facility] [address], [place], [place] [address]    Special Requests   Final    BOTTLES DRAWN AEROBIC AND ANAEROBIC Blood Culture adequate volume Performed at [facility] [address] [place], [place] [address]    Culture   Final    NO GROWTH 5 DAYS Performed at [facility], [place] [address] [place], [place] [address]    Report Status 03/03/2019 FINAL  Final  MRSA PCR Screening     Status: None   Collection Time: 03/21/2019  6:15 PM   Specimen: Nasopharyngeal  Result Value Ref Range Status   MRSA by PCR NEGATIVE NEGATIVE Final    Comment:        The GeneXpert MRSA Assay (FDA approved for NASAL specimens only), is one component of Eusebia Grulke comprehensive MRSA colonization surveillance program. It is not intended to diagnose MRSA infection nor to guide or monitor treatment for MRSA infections. Performed at [facility], [place] [address] [place], [place] [address]   Culture, blood (routine x 2)     Status: None   Collection Time: 03/19/2019  6:19 PM   Specimen: BLOOD RIGHT HAND  Result Value Ref Range Status   Specimen Description   Final    BLOOD RIGHT HAND Performed at [facility], [place] [address] [place], [place] [address]    Special Requests   Final    BOTTLES DRAWN AEROBIC AND ANAEROBIC Blood Culture adequate volume Performed at [facility] [address] [place], [place] [address]    Culture   Final    NO GROWTH 5 DAYS Performed at [facility], [place] [address], [place], [place] [address]    Report Status 03/03/2019 FINAL  Final         Radiology Studies: Dg Chest Port 1 View  Result  Date: 03/02/2019 CLINICAL DATA:  85 year old male with COVID-19. Shortness of breath. EXAM: PORTABLE CHEST 1 VIEW COMPARISON:  03/01/2019 portable chest and earlier. FINDINGS: Portable AP semi upright view at 0914 hours. Stable lung volumes and mediastinal contours. Stable left chest pacemaker. Visualized tracheal air column is within normal limits. Similar gas within the thoracic esophagus also. Continued bilateral widespread interstitial and indistinct pulmonary opacity. Ventilation appears mildly decreased in both lungs from yesterday. These opacities are new since 02/11/2019. No superimposed pneumothorax. No pleural effusion is evident. Negative visible bowel gas pattern. No acute osseous abnormality identified. IMPRESSION: 1. Stable to mildly progressed widespread bilateral lung opacity since yesterday compatible with COVID-19 pneumonia. 2. No new cardiopulmonary abnormality. Electronically Signed   By: Genevie Ann M.D.   On: 03/02/2019 09:26        Scheduled Meds: . gabapentin  300 mg Oral BID   Continuous Infusions: . morphine 2 mg/hr (03/03/19 1200)     LOS: 5 days    Time spent: over 30 min    Fayrene Helper, MD [facility] Pager AMION  If 7PM-7AM, please contact night-coverage [email or web address] Password [facility] 03/03/2019, 12:48 PM

## 2019-03-03 NOTE — Progress Notes (Signed)
Spoke with daughter, [name]. Update given on patient condition. Explained that patient slept comfortably last PM. Received 2 doses of prn pain medication for 1900-0700 shift. Disclosed that patient told staff nurse that patient spouse died this past 05-15-22; daughter confirms.

## 2019-03-03 NOTE — Progress Notes (Signed)
Daily Progress Note   Patient Name: Johnathan Hudson       Date: 03/03/2019 DOB: [birth year]  Age: 85 y.o. MRN#: [id number] Attending Physician: Elodia Florence., * Primary Care Physician: Tracie Harrier, MD Admit Date: 02/28/2019  Reason for Consultation/Follow-up: Terminal Care  Subjective: Per RN, patient wanted NRB mask off this morning but remains uncomfortable despite prn morphine and prn ativan given. Tachypnea with RR mid 30's. Oxygen saturations 70's on 4L [place].  Spoke with daughter, Johnathan Hudson via telephone. She is tearful and shares that seeing her father yesterday was really hard, but she is very appreciative that her and siblings could visit. Their mother died in 05-May-2017 and she shares that her father is "ready to be with mama." They were married 45 years. He was living independently prior to admission and Johnathan Hudson shares "he wouldn't dare want anyone to take care of him."  Updated Johnathan Hudson that her father requested NRB mask off this morning but is uncomfortable despite prn medications. Recommended morphine infusion for comfort. Johnathan Hudson agrees and again confirms her wish for her father to be comfortable at EOL. Prepared Johnathan Hudson that he is not stable for transfer to hospice home, and will pass in the hospital. Johnathan Hudson confirms understanding. PMT contact information given.   Length of Stay: 5  Current Medications: Scheduled Meds:  . albuterol  2 puff Inhalation Q6H  . gabapentin  300 mg Oral BID  . mometasone-formoterol  2 puff Inhalation BID  . umeclidinium bromide  1 puff Inhalation Daily    Continuous Infusions: . morphine      PRN Meds: acetaminophen **OR** acetaminophen, albuterol, antiseptic oral rinse, glycopyrrolate **OR** glycopyrrolate **OR** glycopyrrolate, haloperidol  **OR** haloperidol **OR** haloperidol lactate, HYDROcodone-acetaminophen, LORazepam **OR** LORazepam **OR** LORazepam, morphine injection, morphine, ondansetron **OR** ondansetron (ZOFRAN) IV, polyvinyl alcohol  Physical Exam          Vital Signs: BP 93/68   Pulse (!) 114   Temp 98.7 F (37.1 C) (Axillary)   Resp (!) 24   Ht 5\' 5"  (1.651 m)   Wt 70 kg   SpO2 (!) 71%   BMI 25.68 kg/m  SpO2: SpO2: (!) 71 % O2 Device: O2 Device: Nasal Cannula O2 Flow Rate: O2 Flow Rate (L/min): 4 L/min  Intake/output summary:   Intake/Output Summary (Last 24 hours) at 03/03/2019  [id number] Last data filed at 03/03/2019 [id number] Gross per 24 hour  Intake 170 ml  Output 1750 ml  Net -1580 ml   LBM: Last BM Date: 03/01/19 Baseline Weight: Weight: 70 kg Most recent weight: Weight: 70 kg       Palliative Assessment/Data: PPS 20%      Patient Active Problem List   Diagnosis Date Noted  . Palliative care by specialist   . Terminal care   . Hypoxia 03/04/2019  . COPD (chronic obstructive pulmonary disease) ([place]) 03/21/2019  . Pneumonia due to COVID-19 virus 02/13/2019  . COVID-19 virus infection 02/13/2019  . Bilateral pleural effusion 02/13/2019  . CKD (chronic kidney disease), stage III 02/13/2019  . Anemia of chronic disease 02/13/2019  . HCAP (healthcare-associated pneumonia)   . Acute respiratory failure with hypoxia ([place]) 02/11/2019  . Atrial fibrillation, chronic ([place])   . Tremor   . Anxiety and depression   . COPD with acute exacerbation ([place])   . Sepsis ([place]) 02/04/2019  . Atrial fibrillation with RVR ([place])   . Community acquired pneumonia   . Hypotension   . Type 2 diabetes mellitus with stage 3a chronic kidney disease, without long-term current use of insulin [facility])     Palliative Care Assessment & Plan   Patient Profile: 85 y.o. male  with past medical history of COPD, DM type 2, atrial fib on eliquis, depression, anxiety, HLD, cardiomyopathy, renal insufficiency, arthritis  admitted on 03/17/2019 with shortness of breath. Patient recently hospitalized at [facility] for covid-19 infection discharged on 11/24 after completing course of steroids and remdesivir. Poor prognosis due to acute hypoxic respiratory failure requiring 15L NRB mask secondary to COVID 19 pneumonia/possible bacterial pneumonia. Palliative medicine consultation for goals of care/EOL care.  Assessment: Acute respiratory failure COVID 19 pneumonia/possible bacterial pneumonia Hx of afib Hx of depression/anxiety Hx of COPD Hx of cardiomyopathy  Recommendations/Plan:  Comfort measures only  Start morphine infusion 2mg /hr for comfort. RN may bolus morphine via infusion 2-4mg  q23min prn pain/dyspnea/tachypnea/air hunger.  Continue prn ativan and robinul for comfort.  Chaplain visit if possible.  Patient unstable for transfer to hospice home. Anticipate hospital death. Possibly hours now that patient has requested removal of NRB mask. Updated daughter.   Goals of Care and Additional Recommendations:  Limitations on Scope of Treatment: Full Comfort Care  Code Status: DNR/DNI   Code Status Orders  (From admission, onward)         Start     Ordered   03/02/19 1333  Do not attempt resuscitation (DNR)  Continuous    Question Answer Comment  In the event of cardiac or respiratory ARREST Do not call a "code blue"   In the event of cardiac or respiratory ARREST Do not perform Intubation, CPR, defibrillation or ACLS   In the event of cardiac or respiratory ARREST Use medication by any route, position, wound care, and other measures to relive pain and suffering. May use oxygen, suction and manual treatment of airway obstruction as needed for comfort.      03/02/19 1333        Code Status History    Date Active Date Inactive Code Status Order ID Comments User Context   03/19/2019 1846 03/02/2019 1333 DNR [id number]  Elodia Florence., MD Inpatient   02/13/2019 2336 02/17/2019  1919 DNR [id number]  Phillips Grout, MD Inpatient   02/13/2019 0720 02/13/2019 2336 Full Code [id number]  Charlynne Cousins, MD Inpatient   02/12/2019 [phone] 02/13/2019 [phone]  DNR [id number]  Rise Patience, MD ED   02/04/2019 1153 02/07/2019 1905 DNR [id number]  Loletha Grayer, MD ED   02/05/2017 1246 02/05/2017 1708 Full Code [id number]  Royston Cowper, MD Inpatient   03/01/2015 1315 03/01/2015 1740 Full Code [id number]  Isaias Cowman, MD Inpatient   Advance Care Planning Activity       Prognosis:   Likely hours  Discharge Planning:  Anticipated Hospital Death  Care plan was discussed with RN, daughter Johnathan Hudson), updated Dr. Florene Glen via secure chat  Thank you for allowing the Palliative Medicine Team to assist in the care of this patient.  The above conversation was completed via telephone due to visitor restrictions during COVID-19 pandemic. Thorough chart review and discussion with multidisciplinary team was completed as part of assessment. No physical examination was performed.    Time In: 0915 Time Out: 0935 Total Time 20 Prolonged Time Billed no      Greater than 50%  of this time was spent counseling and coordinating care related to the above assessment and plan.  Ihor Dow, DNP, FNP-C Palliative Medicine Team  Phone: [phone] Fax: [phone]  Please contact Palliative Medicine Team phone at [phone] for questions and concerns.

## 2019-03-03 NOTE — Progress Notes (Signed)
Late Entry for 12.8.20 @ 1916. Patient seen and assessed. Patient found resting in bed with comfort measures in place. Physical assessment completed via computerized charting per [facility] policy. Patient mouth agape. Morphine drip infusing at 3mg / minute via infusion pump. No family present at bedside. Siderails up times 2. Bed in lowest position and locked.

## 2019-03-03 NOTE — Progress Notes (Signed)
   03/03/19 1000  Clinical Encounter Type  Visited With Family;Other (Comment) (Daughter- [name] )  Visit Type Initial;Psychological support;Spiritual support  Referral From Nurse;Palliative care team  Consult/Referral To Chaplain  Spiritual Encounters  Spiritual Needs Emotional;Other (Comment);Grief support (Spiritual Care Conversation/Support)  Stress Factors  Patient Stress Factors Not reviewed  Family Stress Factors Health changes;Loss;Major life changes   I spoke with Mr. Liefer daughter, [name], per spiritual care consult. [name] was grateful for the connection and support. [name] states that she would like me to visit her father today in person. Karen's main concern is that "he is right with God and not angry." [name] stated that they lost her mother back in January, and she feels that Mr. Kees is "ready to go see mama."  [name] told me that the family has good support from one another right now. She also told me about Mr. Vandenberghe. He has miniature goats and a chihuahua named, Peanut, at home, who mean a great deal to him. [name] also stated that he wouldn't want to "suffer," and she wants him to be comfortable.   I will continue to follow up with Mr. Croom and his family.   Please, contact Spiritual Care for further assistance.  Chaplain Shanon Ace M.Div., [facility]

## 2019-03-04 DIAGNOSIS — Z66 Do not resuscitate: Secondary | ICD-10-CM | POA: Diagnosis present

## 2019-03-04 DIAGNOSIS — E1121 Type 2 diabetes mellitus with diabetic nephropathy: Secondary | ICD-10-CM

## 2019-03-04 DIAGNOSIS — N1831 Chronic kidney disease, stage 3a: Secondary | ICD-10-CM

## 2019-03-04 NOTE — Progress Notes (Signed)
PMT chart review and discussed with RN. Patient remains comfortable on continuous morphine infusion. Not requiring frequent bolus doses. On room air with periods of apnea. PRN medications on MAR for breakthrough pain, dyspnea, tachypnea, anxiety, or secretions. Encouraged RN to page PMT provider with any symptom needs.   Updated daughter, [name] via telephone. She is glad to hear her father is comfortable and we are both surprised he made it through the night. Prepared [name] for 'anything to happen at any time' with periods of apnea now. [name] understands and appreciative of update. Emotional/spiritual support provided. [name] has PMT contact information.   NO CHARGE  Ihor Dow, [place], FNP-C Palliative Medicine Team  Phone: [phone] Fax: [phone]

## 2019-03-04 NOTE — Progress Notes (Signed)
Notified daughter of progress.  All questions were answered and this nurse's contact number shared for further communication.   

## 2019-03-04 NOTE — Progress Notes (Signed)
PROGRESS NOTE  Johnathan Hudson  [id number] DOB: 03-03-1934 DOA: 03/06/2019 PCP: Tracie Harrier, MD  Brief Narrative: Johnathan Hudson a 85 y.o.malewith medical history significant ofCOPD, T2DM, atrial fib on eliquis, depression/anxiety, HLD and multiple other medical problems with recent COVID 19 diagnosis on 11/18 with 2 prior visits to the [facility] for treatment, representing with shortness of breath.  Johnathan Hudson has previously been seen at the [facility] on 11/18 and 11/20. He was most recently discharged on 11/24 after completing a course of steroids and remdesivir. At that point in time he was improved on RA. He was discharged and did well until 1-2 days ago when he began to have worsening SOB. His daughter noted a fever to 100.8. He saw his PCP who started steroids and oxygen, but he represented today to the ED with worsening SOB. He denies chest pain or orthopnea. He does occasionally have PND. Denies abdominal pain, nausea, vomiting. He denies any recent weight gain or LE swelling. Denies smoking or etoh use.   Assessment & Plan: Active Problems:   Type 2 diabetes mellitus with stage 3a chronic kidney disease, without long-term current use of insulin (HCC)   Atrial fibrillation, chronic (HCC)   Acute respiratory failure with hypoxia (HCC)   COVID-19 virus infection   Hypoxia   COPD (chronic obstructive pulmonary disease) ([place])   Palliative care by specialist   Terminal care   Dyspnea   Tachypnea   Anxiety state   DNR (do not resuscitate)  Encounter for palliative care, goals of care discussion: Appreciate palliative care input. Continue morphine infusion at current rate. Anticipate inpatient death in hours-days.  Acute Hypoxic Respiratory Failure  COVID 19 Pneumonia s/p treatment with steroids and Remdesivir  Possible Bacterial Pneumonia: CT with bilateral airspace opacities, ground glass. Serial CXRs showed progression of infiltrates  despite full scope of treatment. Hypoxia worsened despite continued steroids and empiric antibiotics as well as multiple doses of diuretic. With the patient's poor premorbid functional capacity, advanced age, and progressive disease despite maximal therapy, goals of care discussions were undertaken. The patient was DNR on admission, and his daughter confirmed this. Since further medical interventions were deemed futile, she agreed along with palliative care colleagues that deescalation of care to comfort measures only was most appropriate.   Chronic atrial fibrillation:No longer monitoring rate. Medications would not aid in comfort.  Demand ischemia, Type II NSTEMI: troponins flat with delta <20. Low suspicion for ACS. EKG appears similar to priors with afib, but new q in lead III as well as T wave inversions in V2-V6 (appears new in V2 and V3). Will continue to monitor. Repeat EKG in AM (appears c/w prior).  T2DM uncontrolled with hyperglycemia: start basal insulin - increase.  Start mealtime insulin. SSI. Follow with steroids. Not on home meds.   COPD: continue home meds, no clear wheezing on exam  Anxiety, depression:prn's ordered, well-controlled  History of Bradycardia:s/p pacemaker, not believed to have AICD.  Chronic Back Pain:Morphine gtt as above, appears well controlled.  HLD: No further treatment planned.   Leukocytosis: likely 2/2 steroids, possible bacterial infection, though less likely with neg pct. No further evaluation.   Thrombocytopenia: No further evaluation.  AKI on stage IIIb CKD: No further evaluation.    Subjective: Nonresponsive.  Objective: Vitals:   03/02/19 1900 03/02/19 1944 03/03/19 0748 03/03/19 0806  BP:   93/68 93/68  Pulse:  74 91 (!) 114  Resp:   (!) 22 (!) 24  Temp:  Marland Kitchen)  97.5 F (36.4 C) 98.7 F (37.1 C)   TempSrc:  Oral Axillary   SpO2:  96% (!) 88% (!) 71%  Weight:      Height: 5\' 5"  (1.651 m)       Gen: Elderly,  chronically ill-appearing male in no distress with mouth agape Pulm: Non-labored, slow rate, secretions rattling.  Neuro: Not responsive.   Scheduled Meds: . gabapentin  300 mg Oral BID   Continuous Infusions: . morphine 3 mg/hr (03/04/19 0650)     LOS: 6 days   Time spent: 25 minutes.  Patrecia Pour, MD [facility] [email or web address] 03/04/2019, 4:55 PM

## 2019-03-04 NOTE — Progress Notes (Signed)
Spoke with daughter [name] and updated on patient condition. Pt seems very comfortable, is sleeping and not rousable. Assured her that he will be monitored and will call her with any updates.  [place] with [name] again and updated on pt condition. Took phone into room so that family could speak to him. Will continue to update them with any changes and check on pt frequently.

## 2019-03-04 NOTE — Social Work (Signed)
Spoke to attending RN re hospice home placement. Per RN, pt is not expected to survive through tomorrow. Will revisit need for hospice home if needed.   Wandra Feinstein, MSW, LCSW [phone] ([facility] coverage)

## 2019-03-05 ENCOUNTER — Other Ambulatory Visit: Payer: Self-pay

## 2019-03-09 ENCOUNTER — Ambulatory Visit: Payer: Medicare HMO

## 2019-03-27 NOTE — Death Summary Note (Addendum)
DEATH SUMMARY   Patient Details  Name: Johnathan Hudson MRN: [id number] DOB: July 17, 1933  Admission/Discharge Information   Admit Date:  03/11/2019  Date of Death: Date of Death: 03/18/19  Time of Death: Time of Death: 0540  Length of Stay: 7  Referring Physician: Tracie Harrier, MD   Reason(s) for Hospitalization  Shortness of breath, acute hypoxic respiratory failure  Diagnoses  Preliminary cause of death: Covid-19 pneumonia Secondary Diagnoses (including complications and co-morbidities):  Active Problems:   Type 2 diabetes mellitus with stage 3a chronic kidney disease, without long-term current use of insulin (HCC)   Atrial fibrillation, chronic (HCC)   Acute respiratory failure with hypoxia (HCC)   COVID-19 virus infection   Hypoxia   COPD (chronic obstructive pulmonary disease) ([place])   Palliative care by specialist   Terminal care   Dyspnea   Tachypnea   Anxiety state   DNR (do not resuscitate) Type 2 NSTEMI Chronic atrial fibrillation Depression Chronic back pain Hyperlipidemia Thrombocytopenia AKI on stage IIIb CKD  Brief Hospital Course (including significant findings, care, treatment, and services provided and events leading to death)  Johnathan Hudson an 86 y.o.malewith medical history significant ofCOPD, T2DM, atrial fib on eliquis, depression/anxiety, HLD and multiple other medical problems with recent COVID 19 diagnosis on 11/18 with 2 prior visits to the [facility] for treatment, who presented once again with shortness of breath.  Johnathan Hudson has previously been seen at the [facility] on 11/18 and 11/20. He was most recently discharged on 11/24 after completing a course of steroids and remdesivir. At that point in time he was improved on RA. He was discharged and did well until 1-2 days ago when he began to have worsening SOB. His daughter noted a fever to 100.8. He saw his PCP who started steroids and oxygen, but he presented to  the ED with worsening SOB. On evaluation he was hypoxic with multifocal pneumonia on imaging, restarted on steroids, given empiric antibiotics, and diuretics.CT with bilateral airspace opacities, ground glass. Serial CXRs showed progression of infiltrates despite full scope of treatment. Hypoxia worsened despite continued steroids and empiric antibiotics as well as multiple doses of diuretic. The patient experienced acute on chronic renal failure as well as type 2 NSTEMI due to myocardial demand ischemia. With the patient's poor premorbid functional capacity, advanced age, and progressive disease despite maximal therapy, goals of care discussions were undertaken. The patient was DNR on admission, and his daughter confirmed this. Since further medical interventions were deemed futile, she agreed along with palliative care colleagues that deescalation of care to comfort measures only was most appropriate. He was not stable for transfer to hospice, so was kept in the hospital until he passed.  Pertinent Labs and Studies  Significant Diagnostic Studies DG Chest 2 View  Result Date: 02/11/2019 CLINICAL DATA:  Recent pneumonia, non improving EXAM: CHEST - 2 VIEW COMPARISON:  02/06/2019, 02/04/2019, CT 03/05/2018 FINDINGS: Small bilateral pleural effusions. Patchy airspace disease at the left base. Stable cardiomediastinal silhouette. Left-sided pacing device as before. No pneumothorax. IMPRESSION: 1. Small bilateral pleural effusions with persistent patchy airspace disease at the left base which may reflect residual pneumonia. 2. Borderline to mild cardiomegaly Electronically Signed   By: Donavan Foil M.D.   On: 02/11/2019 18:18   DG Chest 2 View  Result Date: 02/06/2019 CLINICAL DATA:  Cough, fever and shortness of breath. EXAM: CHEST - 2 VIEW COMPARISON:  02/04/2019 FINDINGS: The patient has taken a better inspiration today. There  is cardiomegaly. Pacemaker remains in place. The lungs appear better on  today's study which could be due to the better inspiration or there could be improvement in mild edema or infiltrates. No worsening or new finding. Tiny effusions in the posterior costophrenic angles. IMPRESSION: Radiographic improvement since 2 days ago. Better inspiration. The lungs appear essentially clear. Small effusions in the posterior costophrenic angles. Electronically Signed   By: Nelson Chimes M.D.   On: 02/06/2019 09:21   CT Chest Wo Contrast  Result Date: 03/22/2019 CLINICAL DATA:  Pt's daughter reports that pt tested positive for COVID on 11/16 and he has had some difficulty breathing. Pt went to his MD today and was advised to come to the ED. Pt was put on O2 2 days ago and it was increased to 4L today. EXAM: CT CHEST WITHOUT CONTRAST TECHNIQUE: Multidetector CT imaging of the chest was performed following the standard protocol without IV contrast. COMPARISON:  03/05/2018 FINDINGS: Cardiovascular: Heart is mildly enlarged. No pericardial effusion. Three-vessel coronary artery calcifications. Aorta is normal in caliber. Mild aortic atherosclerotic calcifications. Mediastinum/Nodes: Unremarkable thyroid. No neck base or axillary masses or enlarged lymph nodes. No mediastinal or hilar masses. Prominent prevascular node measuring 9 mm in short axis. Nodes are stable when compared to the prior chest CT, none enlarged by size criteria. Trachea is unremarkable. Small to moderate size hiatal hernia. Esophagus unremarkable. Lungs/Pleura: Extensive bilateral airspace lung opacities, which are predominantly ground-glass with small areas of more confluent opacity in a bronchovascular distribution. Lung opacities are most prominent in the upper lobes, but seen in right middle and lower lobes as well. Trace right pleural effusion. There is mild dependent atelectasis in the lower lobes, greater on the right. No pneumothorax. Upper Abdomen: No acute findings. Musculoskeletal: No fracture or acute finding.  No bone  lesion. IMPRESSION: 1. Extensive bilateral airspace lung opacities, which are predominantly ground-glass. This is presumably due to COVID-19 pneumonia given the history of a positive COVID-19 test on 02/09/2019. If patient does not have symptoms of infection, however, pulmonary edema should be considered in the differential. Trace right pleural effusion. 2. No other acute abnormalities. 3. Coronary artery calcifications, mild cardiomegaly and mild aortic atherosclerotic calcifications. Aortic Atherosclerosis (ICD10-I70.0). Electronically Signed   By: Lajean Manes M.D.   On: 02/27/2019 14:24   DG CHEST PORT 1 VIEW  Result Date: 03/02/2019 CLINICAL DATA:  86 year old male with COVID-19. Shortness of breath. EXAM: PORTABLE CHEST 1 VIEW COMPARISON:  03/01/2019 portable chest and earlier. FINDINGS: Portable AP semi upright view at 0914 hours. Stable lung volumes and mediastinal contours. Stable left chest pacemaker. Visualized tracheal air column is within normal limits. Similar gas within the thoracic esophagus also. Continued bilateral widespread interstitial and indistinct pulmonary opacity. Ventilation appears mildly decreased in both lungs from yesterday. These opacities are new since 02/11/2019. No superimposed pneumothorax. No pleural effusion is evident. Negative visible bowel gas pattern. No acute osseous abnormality identified. IMPRESSION: 1. Stable to mildly progressed widespread bilateral lung opacity since yesterday compatible with COVID-19 pneumonia. 2. No new cardiopulmonary abnormality. Electronically Signed   By: Genevie Ann M.D.   On: 03/02/2019 09:26   DG CHEST PORT 1 VIEW  Result Date: 03/01/2019 CLINICAL DATA:  Hypoxia. EXAM: PORTABLE CHEST 1 VIEW COMPARISON:  02/28/2019 FINDINGS: Left chest wall pacer device is noted with leads in the right atrial appendage and right ventricle. Normal heart size. Aortic atherosclerosis. Bilateral upper and lower lung zone interstitial and airspace opacities are  again noted. Not significantly  changed from previous exam. IMPRESSION: No change in aeration to the lungs compared with previous exam. Electronically Signed   By: Kerby Moors M.D.   On: 03/01/2019 15:19   DG Chest Port 1 View  Result Date: 03/10/2019 CLINICAL DATA:  Shortness of breath EXAM: PORTABLE CHEST 1 VIEW COMPARISON:  02/11/2019 FINDINGS: Left-sided implanted cardiac device. Stable mild cardiomegaly. There are multifocal patchy airspace opacities throughout both lungs, new from prior. Lung volumes are low. No pleural effusion or pneumothorax. IMPRESSION: Multifocal patchy airspace opacities throughout both lungs which may reflect multifocal pneumonia versus pulmonary edema. Electronically Signed   By: Davina Poke M.D.   On: 03/09/2019 11:30   DG Chest Port 1 View  Result Date: 02/04/2019 CLINICAL DATA:  Fever.  Shortness of breath. EXAM: PORTABLE CHEST 1 VIEW COMPARISON:  February 23, 2015 FINDINGS: The cardiomediastinal silhouette is stable. Stable pacemaker. The right lung is clear. There is mild opacity in the left mid lower lung. No other acute abnormalities. IMPRESSION: Mild opacity in left lung may represent infection or asymmetric edema. Recommend clinical correlation. Electronically Signed   By: Dorise Bullion III M.D   On: 02/04/2019 09:55   ECHOCARDIOGRAM LIMITED  Result Date: 02/13/2019   ECHOCARDIOGRAM LIMITED REPORT   Patient Name:   DAICHI MORIS Date of Exam: 02/13/2019 Medical Rec #:  [id number]       Height:       66.0 in Accession #:    [id number]      Weight:       164.0 lb Date of Birth:  [birth year]       BSA:          1.84 m Patient Age:    41 years        BP:           121/74 mmHg Patient Gender: M               HR:           79 bpm. Exam Location:  Inpatient  Procedure: Cardiac Doppler, 2D Echo and Color Doppler Indications:    Dyspnea 786.09 / R06.00  History:        Patient has no prior history of Echocardiogram examinations.                 CKD. COVID - 19  Positive.  Sonographer:    Jonelle Sidle Dance Referring Phys: [place]  1. Left ventricular ejection fraction, by visual estimation, is 60 to 65%. The left ventricle has normal function. There is no left ventricular hypertrophy.  2. Left ventricular diastolic function could not be evaluated.  3. The left ventricle has no regional wall motion abnormalities.  4. Global right ventricle has normal systolic function.The right ventricular size is normal. No increase in right ventricular wall thickness.  5. Left atrial size was severely dilated.  6. Right atrial size was normal.  7. Mild mitral annular calcification.  8. The mitral valve is grossly normal. Trace mitral valve regurgitation.  9. The tricuspid valve is normal in structure. Tricuspid valve regurgitation is mild. 10. The aortic valve has an indeterminant number of cusps. Aortic valve regurgitation is mild. Mild aortic valve sclerosis without stenosis. 11. The pulmonic valve was not well visualized. Pulmonic valve regurgitation is not visualized. 12. Mildly elevated pulmonary artery systolic pressure. 13. The tricuspid regurgitant velocity is 2.50 m/s, and with an assumed right atrial pressure of 8 mmHg, the estimated right ventricular systolic pressure  is mildly elevated at 33.1 mmHg. 14. A pacer wire is visualized in the RA and RV. 15. The inferior vena cava is dilated in size with >50% respiratory variability, suggesting right atrial pressure of 8 mmHg. 16. The atrial septum is grossly normal. FINDINGS  Left Ventricle: Left ventricular ejection fraction, by visual estimation, is 60 to 65%. The left ventricle has normal function. The left ventricle has no regional wall motion abnormalities. There is no left ventricular hypertrophy. Left ventricular diastolic function could not be evaluated. Right Ventricle: The right ventricular size is normal. No increase in right ventricular wall thickness. Global RV systolic function is has normal  systolic function. The tricuspid regurgitant velocity is 2.50 m/s, and with an assumed right atrial pressure  of 8 mmHg, the estimated right ventricular systolic pressure is mildly elevated at 33.1 mmHg. Left Atrium: Left atrial size was severely dilated. Right Atrium: Right atrial size was normal in size Pericardium: There is no evidence of pericardial effusion. Mitral Valve: The mitral valve is grossly normal. There is mild thickening of the mitral valve leaflet(s). There is mild calcification of the mitral valve leaflet(s). Mild mitral annular calcification. MV Area by PHT, 4.10 cm. MV PHT, 53.65 msec. Trace mitral valve regurgitation. Tricuspid Valve: The tricuspid valve is normal in structure. Tricuspid valve regurgitation is mild. Aortic Valve: The aortic valve has an indeterminant number of cusps. Aortic valve regurgitation is mild. Aortic regurgitation PHT measures 782 msec. Mild aortic valve sclerosis is present, with no evidence of aortic valve stenosis. Pulmonic Valve: The pulmonic valve was not well visualized. Pulmonic valve regurgitation is not visualized. Aorta: The aortic root and ascending aorta are structurally normal, with no evidence of dilitation. Pulmonary Artery: The pulmonary artery is not well seen. Venous: The inferior vena cava is dilated in size with greater than 50% respiratory variability, suggesting right atrial pressure of 8 mmHg. Shunts: The atrial septum is grossly normal. Additional Comments: A pacer wire is visualized in the right atrium and right ventricle.  LEFT VENTRICLE          Normals PLAX 2D LVIDd:         4.36 cm  3.6 cm LVIDs:         2.73 cm  1.7 cm LV PW:         1.06 cm  1.4 cm LV IVS:        0.82 cm  1.3 cm LVOT diam:     1.90 cm  2.0 cm LV SV:         58 ml    79 ml LV SV Index:   30.95    45 ml/m2 LVOT Area:     2.84 cm 3.14 cm2  RIGHT VENTRICLE          IVC RV Basal diam:  2.64 cm  IVC diam: 2.07 cm LEFT ATRIUM              Index       RIGHT ATRIUM            Index LA diam:        4.10 cm  2.23 cm/m  RA Area:     13.70 cm LA Vol (A2C):   106.0 ml 57.67 ml/m RA Volume:   27.10 ml  14.74 ml/m LA Vol (A4C):   132.0 ml 71.81 ml/m LA Biplane Vol: 127.0 ml 69.09 ml/m  AORTIC VALVE             Normals LVOT Vmax:  96.00 cm/s LVOT Vmean:  63.600 cm/s 75 cm/s LVOT VTI:    0.188 m     25.3 cm AI PHT:      782 msec  AORTA                 Normals Ao Root diam: 3.60 cm 31 mm Ao Asc diam:  3.40 cm 31 mm MITRAL VALVE              Normals  TRICUSPID VALVE             Normals MV Area (PHT): 4.10 cm            TR Peak grad:   25.1 mmHg MV PHT:        53.65 msec 55 ms    TR Vmax:        252.00 cm/s 288 cm/s MV Decel Time: 185 msec   187 ms MV E velocity: 97.95 cm/s 103 cm/s SHUNTS                                    Systemic VTI:  0.19 m                                    Systemic Diam: 1.90 cm  Buford Dresser MD Electronically signed by Buford Dresser MD Signature Date/Time: 02/13/2019/4:38:29 PM    Final     Microbiology Recent Results (from the past 240 hour(s))  Respiratory Panel by PCR     Status: None   Collection Time: 03/25/2019  3:08 PM   Specimen: Flu Kit Nasopharyngeal Swab; Respiratory  Result Value Ref Range Status   Adenovirus NOT DETECTED NOT DETECTED Final   Coronavirus 229E NOT DETECTED NOT DETECTED Final    Comment: (NOTE) The Coronavirus on the Respiratory Panel, DOES NOT test for the novel  Coronavirus (2019 nCoV)    Coronavirus HKU1 NOT DETECTED NOT DETECTED Final   Coronavirus NL63 NOT DETECTED NOT DETECTED Final   Coronavirus OC43 NOT DETECTED NOT DETECTED Final   Metapneumovirus NOT DETECTED NOT DETECTED Final   Rhinovirus / Enterovirus NOT DETECTED NOT DETECTED Final   Influenza A NOT DETECTED NOT DETECTED Final   Influenza B NOT DETECTED NOT DETECTED Final   Parainfluenza Virus 1 NOT DETECTED NOT DETECTED Final   Parainfluenza Virus 2 NOT DETECTED NOT DETECTED Final   Parainfluenza Virus 3 NOT DETECTED NOT DETECTED Final    Parainfluenza Virus 4 NOT DETECTED NOT DETECTED Final   Respiratory Syncytial Virus NOT DETECTED NOT DETECTED Final   Bordetella pertussis NOT DETECTED NOT DETECTED Final   Chlamydophila pneumoniae NOT DETECTED NOT DETECTED Final   Mycoplasma pneumoniae NOT DETECTED NOT DETECTED Final    Comment: Performed at [facility], [address] [place], [place] [address]  Culture, blood (routine x 2)     Status: None   Collection Time: 03/09/2019  6:14 PM   Specimen: BLOOD  Result Value Ref Range Status   Specimen Description   Final    BLOOD RIGHT ANTECUBITAL Performed at [place] [address], [place], [place] [address]    Special Requests   Final    BOTTLES DRAWN AEROBIC AND ANAEROBIC Blood Culture adequate volume Performed at [place] [address], [place],  [address]    Culture  Final    NO GROWTH 5 DAYS Performed at [facility], [place] [address] [place], [place] [address]    Report Status 03/03/2019 FINAL  Final  MRSA PCR Screening     Status: None   Collection Time: 03/17/2019  6:15 PM   Specimen: Nasopharyngeal  Result Value Ref Range Status   MRSA by PCR NEGATIVE NEGATIVE Final    Comment:        The GeneXpert MRSA Assay (FDA approved for NASAL specimens only), is one component of a comprehensive MRSA colonization surveillance program. It is not intended to diagnose MRSA infection nor to guide or monitor treatment for MRSA infections. Performed at [facility], [place] [address] [place], [place] [address]   Culture, blood (routine x 2)     Status: None   Collection Time: 03/07/2019  6:19 PM   Specimen: BLOOD RIGHT HAND  Result Value Ref Range Status   Specimen Description   Final    BLOOD RIGHT HAND Performed at [facility], [place] [address] [place], [place] [address]    Special Requests   Final    BOTTLES DRAWN AEROBIC AND ANAEROBIC Blood Culture adequate volume Performed  at [place] [address] [place], [place] [address]    Culture   Final    NO GROWTH 5 DAYS Performed at [facility], [place] [address], [place],  [address]    Report Status 03/03/2019 FINAL  Final    Lab Basic Metabolic Panel: Recent Labs  Lab 03/19/2019 1116 02/27/19 0141 02/28/19 0136 03/01/19 0125 03/02/19 0219  NA 131* 135 139 137 135  K 4.0 4.6 4.1 4.5 4.7  CL 99 103 98 98 99  CO2 20* 21* 25 24 23   GLUCOSE 231* 210* 128* 183* 191*  BUN 32* 36* 52* 64* 71*  CREATININE 1.68* 1.59* 1.88* 1.93* 1.85*  CALCIUM 8.9 8.6* 9.6 9.4 9.4  MG  --  2.2 2.3 2.2 2.3   Liver Function Tests: Recent Labs  Lab 03/14/2019 1116 02/27/19 0141 02/28/19 0136 03/01/19 0125 03/02/19 0219  AST 34 24 40 40 25  ALT 17 18 28  34 29  ALKPHOS 53 47 61 60 65  BILITOT 0.6 0.7 0.7 0.9 1.3*  PROT 6.3* 5.7* 6.4* 5.7* 6.4*  ALBUMIN 2.8* 2.5* 2.7* 2.6* 2.9*   No results for input(s): LIPASE, AMYLASE in the last 168 hours. No results for input(s): AMMONIA in the last 168 hours. CBC: Recent Labs  Lab 03/24/2019 1101 02/27/19 0141 02/28/19 0136 03/01/19 0125 03/02/19 0219  WBC 14.5* 9.3 12.7* 13.5* 15.0*  NEUTROABS  --  8.6* 11.2* 11.9* 13.6*  HGB 13.0 11.4* 12.9* 12.5* 13.4  HCT 41.0 35.7* 40.4 38.9* 41.3  MCV 92.1 91.5 91.8 92.0 91.4  PLT 136* 127* 150 151 170   Cardiac Enzymes: No results for input(s): CKTOTAL, CKMB, CKMBINDEX, TROPONINI in the last 168 hours. Sepsis Labs: Recent Labs  Lab 03/12/2019 1116 02/27/19 0141 02/28/19 0136 03/01/19 0125 03/02/19 0219  PROCALCITON <0.10 <0.10 0.27  --   --   WBC  --  9.3 12.7* 13.5* 15.0*    Procedures/Operations  None   Patrecia Pour, MD 2019/03/24, 7:40 AM

## 2019-03-27 NOTE — Patient Outreach (Signed)
[facility]) Care Management  03/16/19  Johnathan Hudson Jan 06, 1934 [id number]   Case Closure   RN CM received notification that patient passed away on 2019/03/16 while inpatient.   Plan: RN CM will close case at this time.  Enzo Montgomery, RN,BSN,CCM [facility] Telephonic Care Management Coordinator Direct Phone: [phone] Toll Free: [phone] Fax: [phone]

## 2019-03-27 NOTE — Progress Notes (Addendum)
Absence of respirations and heart beat @ 05:40 2 RN's pronounced and wasted 30 ml morphine Bernette Redbird, RN Warrick Parisian, RN  MD notified.

## 2019-03-27 DEATH — deceased

## 2021-05-13 IMAGING — DX DG CHEST 1V PORT
1 series · 1 of 1 positions shown · non-contrast
Comparison: February 23, 2015

CLINICAL DATA: Fever.  Shortness of breath.

EXAM:
PORTABLE CHEST 1 VIEW

[chest ap]
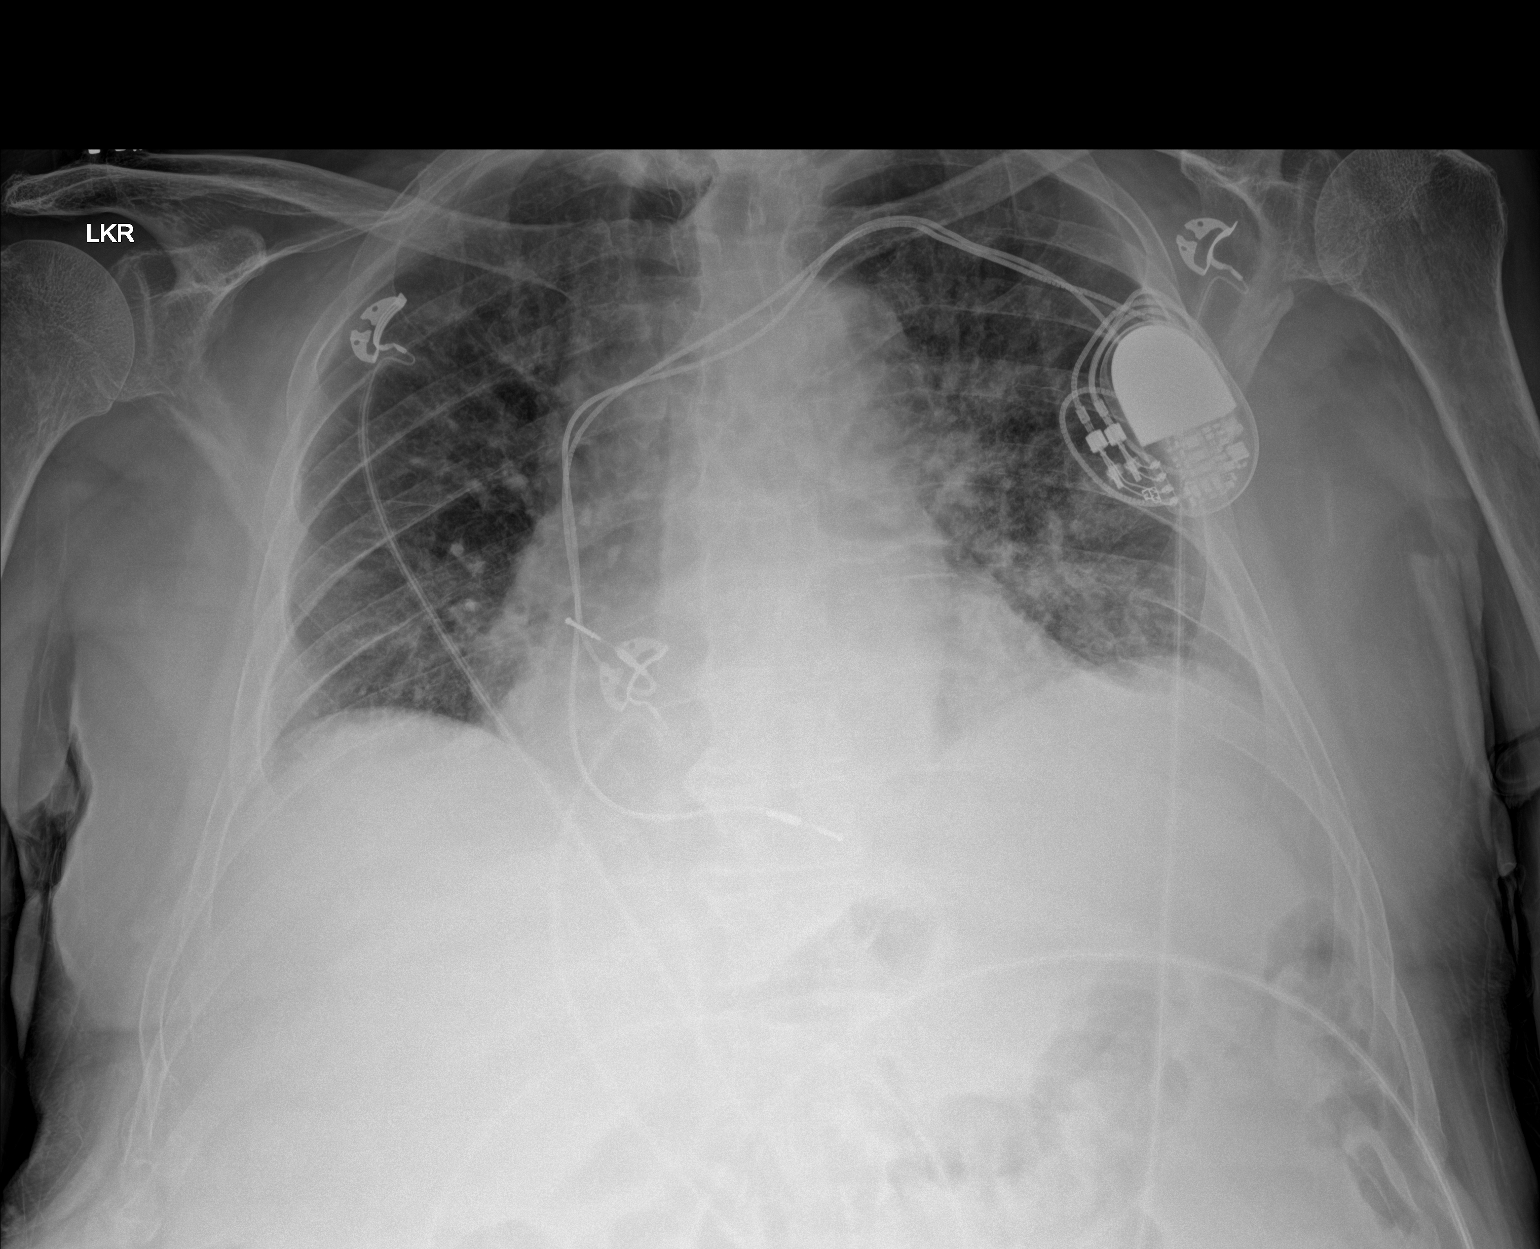

[1 of 1 positions shown; findings below may reference images not displayed]

FINDINGS: The cardiomediastinal silhouette is stable. Stable pacemaker. The
right lung is clear. There is mild opacity in the left mid lower
lung. No other acute abnormalities.
IMPRESSION: Mild opacity in left lung may represent infection or asymmetric
edema. Recommend clinical correlation.

## 2021-05-15 IMAGING — CR DG CHEST 2V
2 series · 3 of 3 positions shown · non-contrast
Comparison: 02/04/2019

CLINICAL DATA: Cough, fever and shortness of breath.

EXAM:
CHEST - 2 VIEW

[Series 2: chest lat · 0.14mm/px · 2 of 2 slices shown]
[im 1/2]
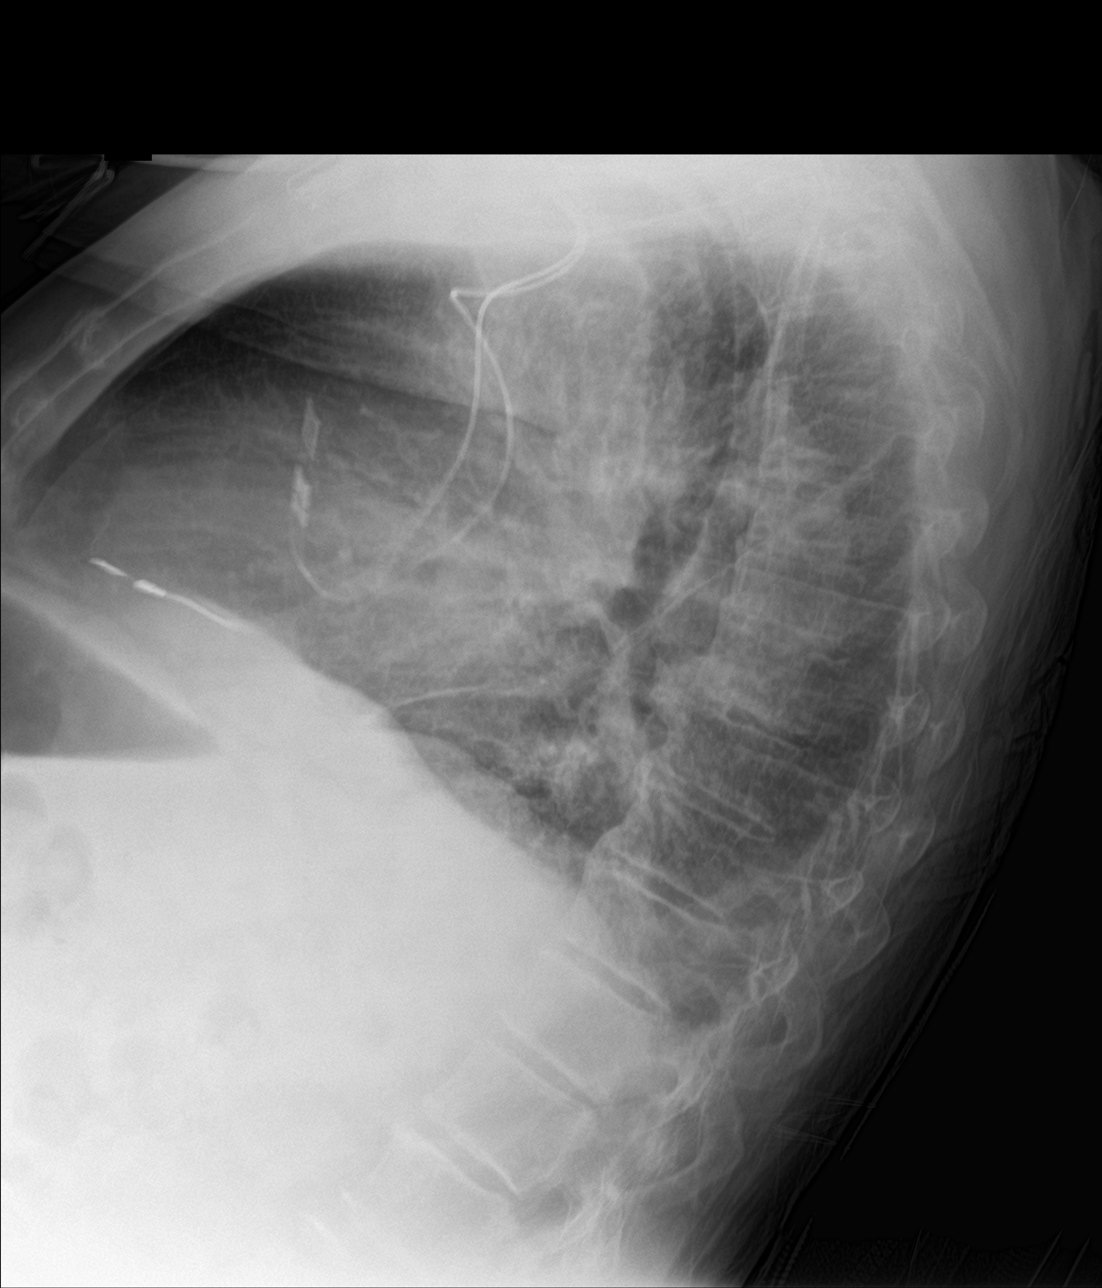
[im 2/2]
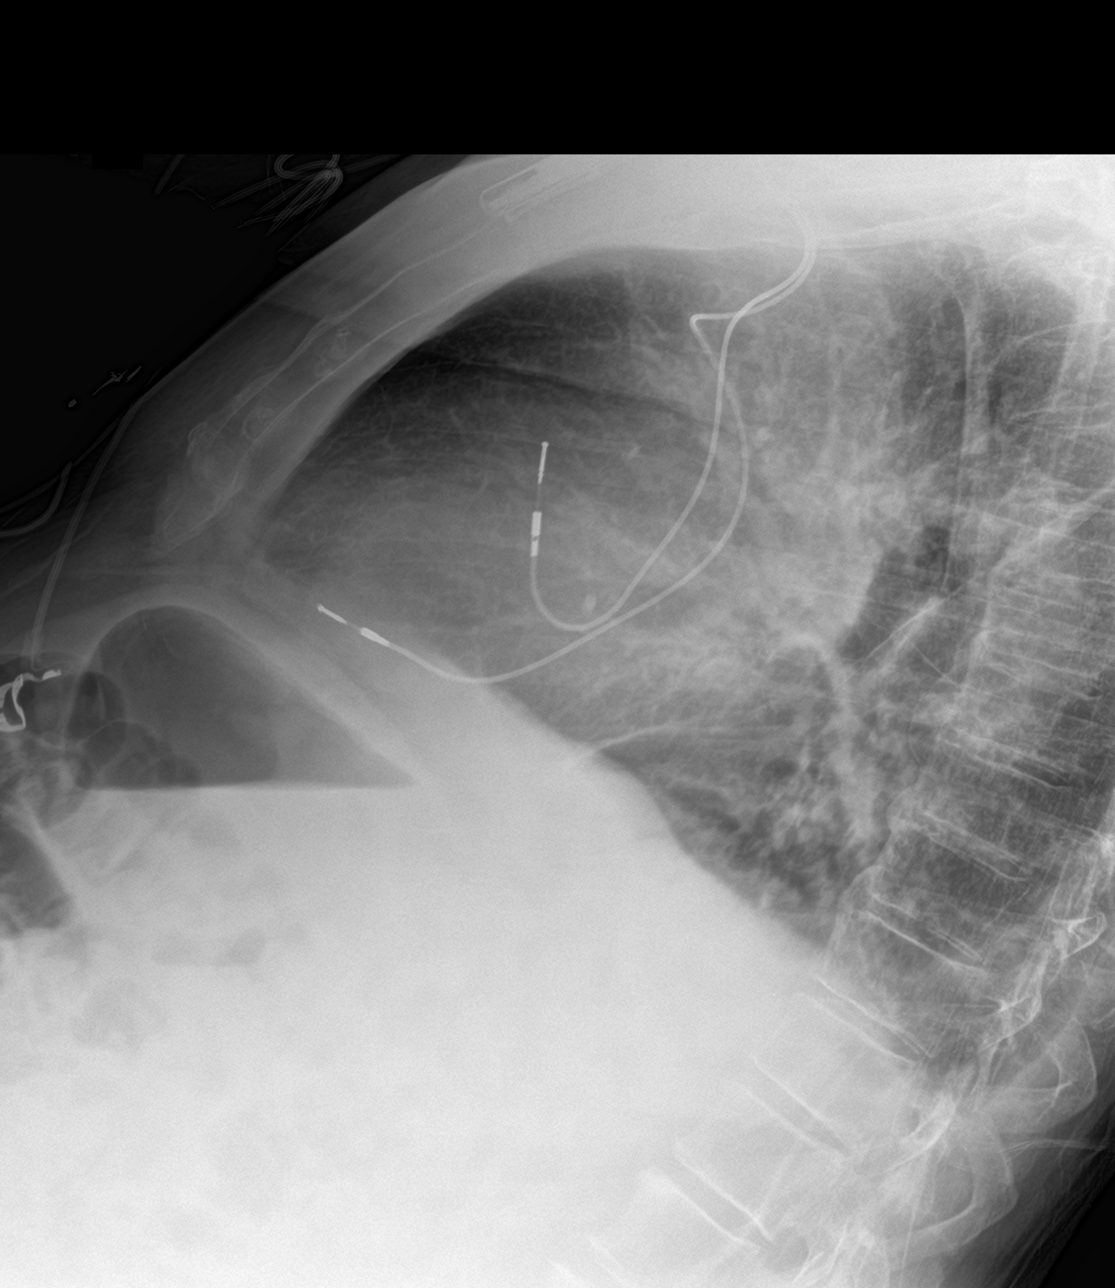

[chest ap]
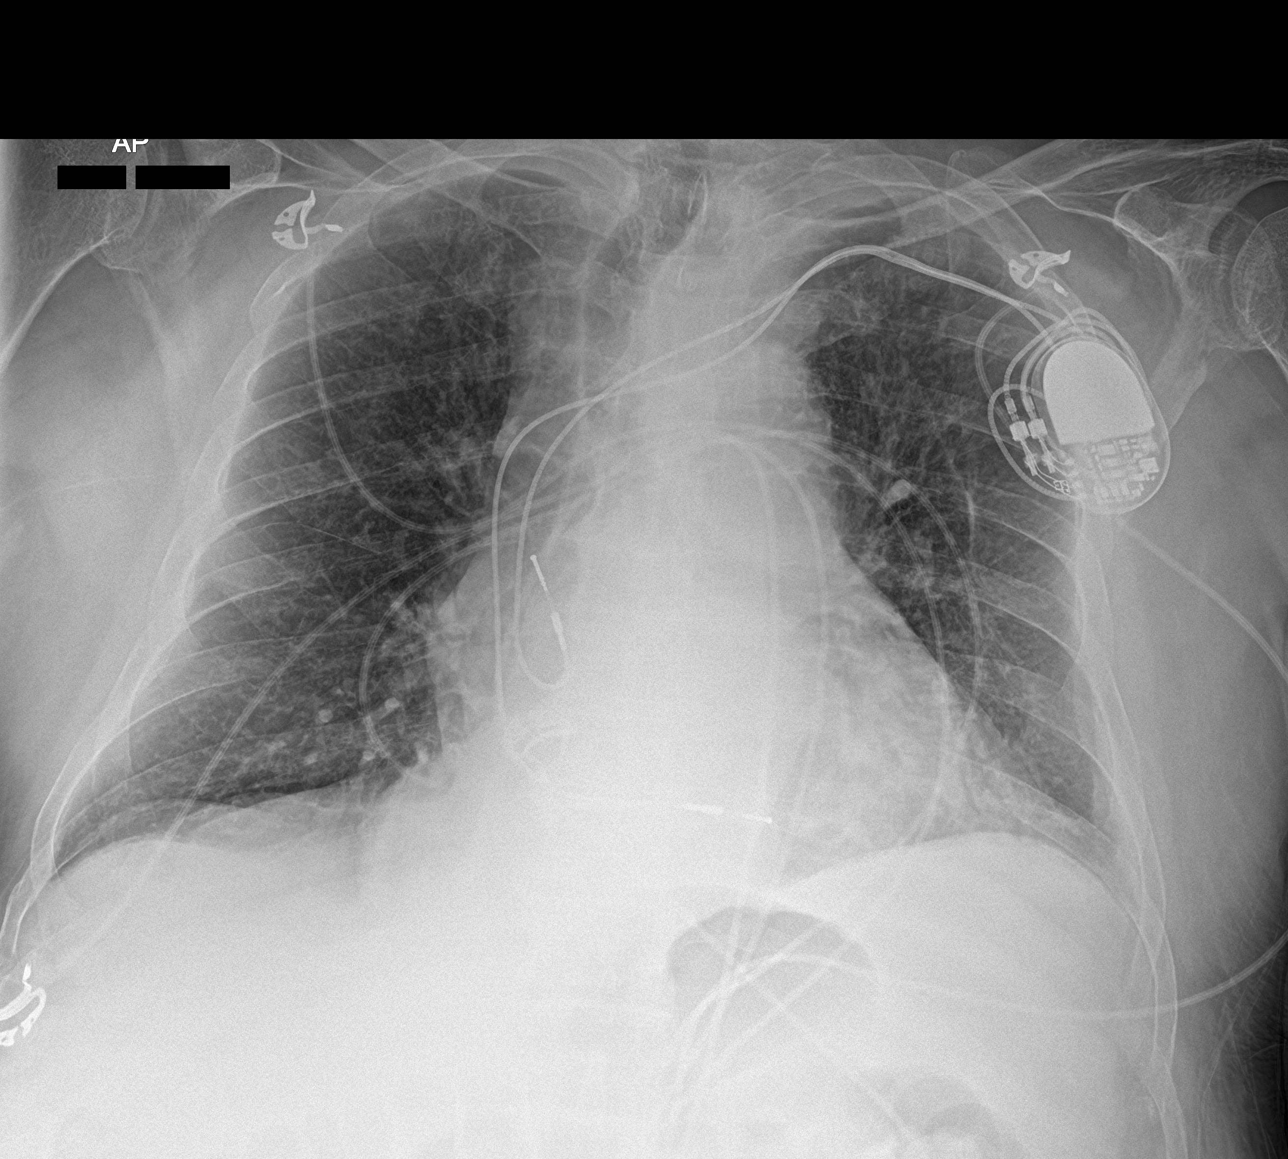

[3 of 3 positions shown; findings below may reference images not displayed]

FINDINGS: The patient has taken a better inspiration today. There is
cardiomegaly. Pacemaker remains in place. The lungs appear better on
today's study which could be due to the better inspiration or there
could be improvement in mild edema or infiltrates. No worsening or
new finding. Tiny effusions in the posterior costophrenic angles.
IMPRESSION: Radiographic improvement since 2 days ago. Better inspiration. The
lungs appear essentially clear. Small effusions in the posterior
costophrenic angles.
